# Patient Record
Sex: Male | Born: 1950 | Race: White | Hispanic: No | Marital: Married | Smoking: Former smoker
Health system: Southern US, Community
[De-identification: ages and names within clinical notes are randomized; demographics above are authoritative.]

## PROBLEM LIST (undated history)

## (undated) DIAGNOSIS — G43909 Migraine, unspecified, not intractable, without status migrainosus: Secondary | ICD-10-CM

## (undated) DIAGNOSIS — M549 Dorsalgia, unspecified: Secondary | ICD-10-CM

## (undated) DIAGNOSIS — E119 Type 2 diabetes mellitus without complications: Secondary | ICD-10-CM

## (undated) DIAGNOSIS — C801 Malignant (primary) neoplasm, unspecified: Secondary | ICD-10-CM

## (undated) DIAGNOSIS — E039 Hypothyroidism, unspecified: Secondary | ICD-10-CM

## (undated) DIAGNOSIS — I10 Essential (primary) hypertension: Secondary | ICD-10-CM

## (undated) DIAGNOSIS — M1990 Unspecified osteoarthritis, unspecified site: Secondary | ICD-10-CM

## (undated) HISTORY — PX: WRIST SURGERY: SHX841

## (undated) HISTORY — PX: SHOULDER SURGERY: SHX246

## (undated) HISTORY — PX: TESTICLE SURGERY: SHX794

## (undated) HISTORY — DX: Dorsalgia, unspecified: M54.9

## (undated) HISTORY — PX: ELBOW SURGERY: SHX618

## (undated) HISTORY — PX: BACK SURGERY: SHX140

## (undated) HISTORY — PX: ARTHROSCOPY WITH ANTERIOR CRUCIATE LIGAMENT (ACL) REPAIR WITH ANTERIOR TIBILIAS GRAFT: SHX6503

---

## 1997-05-16 ENCOUNTER — Ambulatory Visit (HOSPITAL_COMMUNITY): Admission: RE | Admit: 1997-05-16 | Discharge: 1997-05-16 | Payer: Self-pay | Admitting: Family Medicine

## 1999-01-27 ENCOUNTER — Encounter: Admission: RE | Admit: 1999-01-27 | Discharge: 1999-02-08 | Payer: Self-pay | Admitting: Family Medicine

## 1999-02-26 ENCOUNTER — Ambulatory Visit (HOSPITAL_BASED_OUTPATIENT_CLINIC_OR_DEPARTMENT_OTHER): Admission: RE | Admit: 1999-02-26 | Discharge: 1999-02-26 | Payer: Self-pay | Admitting: Orthopedic Surgery

## 1999-10-16 ENCOUNTER — Encounter: Payer: Self-pay | Admitting: Family Medicine

## 1999-10-16 ENCOUNTER — Ambulatory Visit (HOSPITAL_COMMUNITY): Admission: RE | Admit: 1999-10-16 | Discharge: 1999-10-16 | Payer: Self-pay | Admitting: Family Medicine

## 2000-03-20 ENCOUNTER — Ambulatory Visit (HOSPITAL_BASED_OUTPATIENT_CLINIC_OR_DEPARTMENT_OTHER): Admission: RE | Admit: 2000-03-20 | Discharge: 2000-03-20 | Payer: Self-pay | Admitting: Orthopedic Surgery

## 2001-07-27 ENCOUNTER — Encounter (INDEPENDENT_AMBULATORY_CARE_PROVIDER_SITE_OTHER): Payer: Self-pay | Admitting: Specialist

## 2001-07-27 ENCOUNTER — Ambulatory Visit (HOSPITAL_BASED_OUTPATIENT_CLINIC_OR_DEPARTMENT_OTHER): Admission: RE | Admit: 2001-07-27 | Discharge: 2001-07-27 | Payer: Self-pay | Admitting: Urology

## 2001-11-19 ENCOUNTER — Ambulatory Visit (HOSPITAL_COMMUNITY): Admission: RE | Admit: 2001-11-19 | Discharge: 2001-11-19 | Payer: Self-pay | Admitting: Gastroenterology

## 2001-11-19 ENCOUNTER — Encounter (INDEPENDENT_AMBULATORY_CARE_PROVIDER_SITE_OTHER): Payer: Self-pay | Admitting: *Deleted

## 2002-10-16 ENCOUNTER — Ambulatory Visit (HOSPITAL_BASED_OUTPATIENT_CLINIC_OR_DEPARTMENT_OTHER): Admission: RE | Admit: 2002-10-16 | Discharge: 2002-10-16 | Payer: Self-pay | Admitting: Orthopedic Surgery

## 2002-10-16 ENCOUNTER — Ambulatory Visit (HOSPITAL_COMMUNITY): Admission: RE | Admit: 2002-10-16 | Discharge: 2002-10-16 | Payer: Self-pay | Admitting: Orthopedic Surgery

## 2003-06-10 ENCOUNTER — Ambulatory Visit (HOSPITAL_COMMUNITY): Admission: RE | Admit: 2003-06-10 | Discharge: 2003-06-10 | Payer: Self-pay | Admitting: Family Medicine

## 2003-08-09 ENCOUNTER — Emergency Department (HOSPITAL_COMMUNITY): Admission: EM | Admit: 2003-08-09 | Discharge: 2003-08-09 | Payer: Self-pay | Admitting: Emergency Medicine

## 2004-05-21 ENCOUNTER — Ambulatory Visit (HOSPITAL_COMMUNITY): Admission: RE | Admit: 2004-05-21 | Discharge: 2004-05-21 | Payer: Self-pay | Admitting: Gastroenterology

## 2004-07-16 ENCOUNTER — Ambulatory Visit (HOSPITAL_COMMUNITY): Admission: RE | Admit: 2004-07-16 | Discharge: 2004-07-16 | Payer: Self-pay | Admitting: Family Medicine

## 2006-05-18 ENCOUNTER — Ambulatory Visit (HOSPITAL_COMMUNITY): Admission: RE | Admit: 2006-05-18 | Discharge: 2006-05-18 | Payer: Self-pay | Admitting: Family Medicine

## 2007-04-30 ENCOUNTER — Ambulatory Visit: Payer: Self-pay

## 2007-04-30 ENCOUNTER — Encounter: Payer: Self-pay | Admitting: Family Medicine

## 2007-08-07 ENCOUNTER — Inpatient Hospital Stay (HOSPITAL_COMMUNITY): Admission: RE | Admit: 2007-08-07 | Discharge: 2007-08-11 | Payer: Self-pay | Admitting: Neurosurgery

## 2008-01-07 ENCOUNTER — Encounter: Admission: RE | Admit: 2008-01-07 | Discharge: 2008-02-18 | Payer: Self-pay | Admitting: Neurosurgery

## 2008-02-29 ENCOUNTER — Encounter: Admission: RE | Admit: 2008-02-29 | Discharge: 2008-02-29 | Payer: Self-pay | Admitting: Neurosurgery

## 2010-04-13 ENCOUNTER — Ambulatory Visit: Payer: Self-pay | Admitting: Physical Medicine & Rehabilitation

## 2010-05-18 NOTE — Discharge Summary (Signed)
NAMEMARCIN, [name]:  [id number]   MEDICAL RECORD NO.:  [id number]          PATIENT TYPE:  INP   LOCATION:  3006                         FACILITY:  [facility]   PHYSICIAN:  Payton Doughty, M.D.      DATE OF BIRTH:  Dec 04, 1950   DATE OF ADMISSION:  08/07/2007  DATE OF DISCHARGE:  08/11/2007                               DISCHARGE SUMMARY   ADMITTING DIAGNOSIS:  Lumbar spondylosis.   DISCHARGE DIAGNOSIS:  Lumbar spondylosis.   PROCEDURE:  L3-L4 and L4-L5 fusion.   COMPLICATIONS:  None.   DISCHARGE STATUS:  Alive and well.   BODY OF THE TEXT:  This is a 60 year old gentleman, whose history and  physical is recounted in chart.  He had back pain for numerous years and  had a visit with Dr. Venetia Maxon and was recommended to have L3-L4 and L4-L5  decompression and fusion.  History and physical is recounted in the  chart.  He was admitted on August 07, 2007, and underwent an  uncomplicated fusion of L3-L4 and L4-L5.  Postoperatively, he has done  well.  Foley was removed the first day.  He was started on physical  therapy.  Fifth and sixth,  he mobilized in PT.  He is off his PCA, he  is using oral pain medication.  Incision is dry and well healing.  He is  __________ with his brace and his activities of daily living.  He is  being discharged to care of his family, given Percocet for pain, and his  followup will be in the [facility] offices on Monday, via phone call to  arrange the visit with Dr. Venetia Maxon.           ______________________________  Payton Doughty, M.D.     MWR/MEDQ  D:  08/11/2007  T:  08/11/2007  Job:  [id number]

## 2010-05-18 NOTE — Op Note (Signed)
Stephen Lawson, DIEMER NO.:  [id number]   MEDICAL RECORD NO.:  [id number]          PATIENT TYPE:  INP   LOCATION:  3006                         FACILITY:  [facility]   PHYSICIAN:  Danae Orleans. Venetia Maxon, M.D.  DATE OF BIRTH:  [birth year]   DATE OF PROCEDURE:  DATE OF DISCHARGE:                               OPERATIVE REPORT   PREOPERATIVE DIAGNOSES:  Herniated lumbar disk, spondylosis,  degenerative disk disease, and radiculopathy L3-L4 and L4-L5 levels.   POSTOPERATIVE DIAGNOSES:  Herniated lumbar disk, spondylosis,  degenerative disk disease, and radiculopathy L3-L4 and L4-L5 levels.   PROCEDURE:  1. L3 and L4 laminectomy with diskectomy, L3-L4 and L4-L5.  2. Transforaminal lumbar interbody fusion L3-L4 (8-mm by medium PEEK      cage with morselized bone autograft BMP and FortrOss and L4-L5 with      9-mm PEEK TLIF cage with similar bone graft).  3. Segmental posterior instrumentation at L3 through L5 levels.  4. Posterolateral arthrodesis with BMP, morselized bone autograft,      FortrOss, and Actifuse.   SURGEON:  Danae Orleans. Venetia Maxon, MD   ASSISTANTS:  Georgiann Cocker, RN, and Payton Doughty, MD   ANESTHESIA:  General endotracheal anesthesia.   ESTIMATED BLOOD LOSS:  500 mL with 250 mL of Cell Saver blood returned  to the patient.   COMPLICATIONS:  None.   DISPOSITION:  Recovery room.   INDICATIONS:  Stephen Lawson is a 60 year old man who has previously  undergone L5-S1 decompression and fusion in the remote past who has now  developed significant disk degeneration and lateral osteophytes and  herniated disk material with significant left leg pain.  He has  undergone extensive conservative management with injection therapy and  physical therapy, but has had persistent and progressive pain.  It was  elected to take him to surgery for decompression and fusion at these  affected levels.   PROCEDURE:  [name] was brought to the operating room.  Following a  satisfactory and  uncomplicated induction of general endotracheal  anesthesia and placement of intravenous lines and a Foley catheter, the  patient was placed in a prone position on the Reeves table.  Soft  tissue and bony prominences were padded appropriately.  His low back was  prepped and draped in the usual sterile fashion.  The area of planned  incision was infiltrated with local lidocaine.  An incision was made in  the midline from overlying his previous incision from what was felt to  be L3 through L5 levels and carried through the lumbodorsal fascia,  which was incised bilaterally using subperiosteal dissection.  The L3,  L4, and L5 transverse processes were identified.  Self-retaining  retractor was placed to facilitate exposure.  Intraoperative x-ray  confirmed markup probes at the L3, L4, and L5 transverse processes.  Subsequently, a hemilaminectomy of L4 and then subsequently of L3 were  performed on the left side of midline and the thecal sac and L4 and L5  and subsequently L3 nerve roots were decompressed widely.  The  degenerated disks were identified and D'Errico nerve root  retractor was  used to protect the nerve elements.  Using loupe magnification,  endplates were stripped to residual disk material and a thorough  diskectomy was performed.  After diskectomy, endplate preparation and  curettes were utilized and residual disk material and cartilaginous  material was removed.  A laminar spreader was placed in the right side  of midline to facilitate exposure of the interspace, and after a trial  sizing, a 9-mm banana-shaped PEEK medium interbody cage was selected,  packed with morselized bone autograft and FortrOss with a small BMP kit  within the implant.  Additional BMP and FortrOss and bone graft were  placed deep within the interspace and the implant was then placed.  An  additional bone autograft was placed overlying it and tamped into  position.  Similarly, at the L3-L4 level,  thorough diskectomy was  performed.  There was a significant amount of lateral osteophyte.  The  L3 and L4 nerve roots were decompressed as was the thecal sac, and after  trial sizing, an 8-mm PEEK banana-shaped cage was inserted into the  interspace, countersunk appropriately, and again BMP and FortrOss were  placed deep to the implant.  An additional bone autograft was placed  overlying the implant and tamped into position.  Pedicle screw fixation  was then utilized using 45- x 5.5-mm screws at L4-L5 and 5.5- x 50-mm  screws at L3.  All screws had excellent purchase and their positioning  was confirmed on AP and lateral fluoroscopy.  Prelordosed 80-mm rods  were placed, but prior to doing so, the posterolateral elements on the  facet joint complex was decorticated on the right side of midline and  additional bone autograft, FortrOss, BMP, and Actifuse were placed in  this region.  The rods were then locked down in situ.  The self-  retaining retractor was removed and the lumbodorsal fascia was closed  with 1 Vicryl suture, subcutaneous tissues were approximated with 2-0  Vicryl interrupted inverted sutures.  Skin edges were approximated with  interrupted 3-0 Vicryl subcutaneous stitch.  Wound was dressed with  Benzoin, Steri-Strips, Telfa gauze, and tape.  The patient was extubated  in the operating room and taken to recovery in stable satisfactory  condition.      Danae Orleans. Venetia Maxon, M.D.  Electronically Signed     JDS/MEDQ  D:  08/07/2007  T:  08/08/2007  Job:  [id number]

## 2010-05-21 NOTE — Op Note (Signed)
NAME:  Stephen Lawson, Stephen Lawson                            ACCOUNT NO.:  [id number]   MEDICAL RECORD NO.:  [id number]                   PATIENT TYPE:  AMB   LOCATION:  ENDO                                 FACILITY:  [facility]   PHYSICIAN:  Petra Kuba, M.D.                 DATE OF BIRTH:  1950/12/18   DATE OF PROCEDURE:  11/19/2001  DATE OF DISCHARGE:                                 OPERATIVE REPORT   PROCEDURE:  Colonoscopy with polypectomy.   INDICATIONS FOR PROCEDURE:  Family history of colon polyps.  Consent was  signed after risks, benefits, methods, and options were thoroughly discussed  in the office.   MEDICATIONS:  Demerol 70, Versed 7.   SURGEON:  Petra Kuba, M.D.   DESCRIPTION OF PROCEDURE:  Rectal inspection was pertinent for external  hemorrhoids.  Digital examination was negative. Video colonoscope was  inserted and easily advanced around the colon to the cecum. This did require  abdominal pressure, but no position changes. No obvious abnormalities were  seen on insertion. The cecum was identified by the appendiceal orifice and  the ileocecal valve. The scope was slowly withdrawn and the prep was  adequate. There was a fair amount of liquid stool that required washing and  suctioning for adequate visualization.  On slow withdrawal through the  colon, two small ascending polyps were seen.  One was snared, electrocautery  applied, and the polyp was suctioned through the scope and collected in the  trap. Possibly there was some residual adenomatous tissue which was hot  biopsied x1. Two other tiny to small ascending and hepatic flexure polyps  were seen and hot biopsied x1.  No other abnormalities were seen as we  slowly withdrew back to the rectum.  Once back in the rectum, the scope was  retroflexed, pertinent for some internal hemorrhoids. The scope was  straightened and readvanced a short ways up the left side of the colon. Air  was suctioned and the scope removed.  The  patient tolerated the procedure  well.  There was no obvious immediate complications.   ENDOSCOPIC ASSESSMENT:  1. Internal and external hemorrhoids.  2. Three tiny and small hepatic flexure and ascending polyps, two hot     biopsied, one hot snared and then hot biopsied.  3. Otherwise within normal limits to the cecum.    PLAN:  Await pathology to determine future colonic screening.  Happy to see  back p.r.n.  Otherwise return to the care of Dr. Reola Calkins for the customary  health care maintenance including yearly rectals and guaiacs.                                               Petra Kuba, M.D.    MEM/MEDQ  D:  11/19/2001  T:  11/19/2001  Job:  [id number]   cc:   Gaynelle Cage  [address]  [place]  [place] [address]  Fax: [phone]

## 2010-05-21 NOTE — Op Note (Signed)
NAME:  Stephen Lawson, Stephen Lawson                            ACCOUNT NO.:  [id number]   MEDICAL RECORD NO.:  [id number]                   PATIENT TYPE:  AMB   LOCATION:  DSC                                  FACILITY:  [facility]   PHYSICIAN:  Harvie Junior, M.D.                DATE OF BIRTH:  [birth year]   DATE OF PROCEDURE:  10/16/2002  DATE OF DISCHARGE:                                 OPERATIVE REPORT   PREOPERATIVE DIAGNOSIS:  Impingement, acromioclavicular joint arthritis,  right shoulder.   POSTOPERATIVE DIAGNOSIS:  1. Impingement, acromioclavicular joint arthritis, right shoulder.  2. Glenohumeral arthritic change with multiple loose bodies.   OPERATION PERFORMED:  1. Anterolateral acromioplasty from the lateral and posterior portal.  2. Distal clavicle resection through an anterior portal.  3. Debridement of glenohumeral joint with removal of multiple     osteocartilaginous loose bodies from within the glenohumeral joint.   SURGEON:  Harvie Junior, M.D.   ASSISTANT:  Marshia Ly, P.A.   ANESTHESIA:  General with intrascalene block.   INDICATIONS FOR PROCEDURE:  He is a 60 year old male with a history of  having right shoulder problems.  He had been treated with injection therapy,  anti-inflammatory medication, activity modification.  He failed all of this  and because of continued complaints of pain, he was brought to the operating  room for operative arthroscopy.   DESCRIPTION OF PROCEDURE:  The patient was brought to the operating room and  after adequate anesthesia was obtained with general anesthetic, the patient  was placed supine on the operating table.  He was then moved to the beach  chair position.  All bony prominences were well padded.  Attention then  turned to the right shoulder, where after routine prepping and draping and a  shoulder arthroscopy was performed, attention was turned initially into the  glenohumeral joint where there were noted to be multiple  osteocartilaginous  loose bodies.  These were removed with a suction shaver.  The donor site  appeared to be the anterior inferior labrum and glenoid where this was  debrided with suction shaver back to a smooth and stable rim.  Biceps tendon  looked normal.  Rotator cuff undersurface looked normal.  Attention was then  turned out of the glenohumeral joint and subacromial space where there was a  large anterolateral spur.  The anterolateral spur was removed with a  motorized bur.  The distal clavicle was resected over a span of 17 mm.  At  this point the rotator cuff was identified superior surface.  No evidence of  tearing.  There was a significant tenacious bursa on the rotator cuff and  this was debrided with suction shaver  significantly.  At this point the shoulder was copiously irrigated and  suctioned dry.  The arthroscopic portals were closed with a bandage, sterile  compressive dressing was applied.  The patient was taken  to the recovery  room where he was noted to be in satisfactory condition.  The estimated  blood loss for this procedure was none.                                                Harvie Junior, M.D.    Ranae Plumber  D:  10/16/2002  T:  10/16/2002  Job:  [id number]

## 2010-05-21 NOTE — Op Note (Signed)
. [facility]  Patient:    Stephen Lawson, Stephen Lawson                         MRN: [id number] Proc. Date: 03/20/00 Adm. Date:  [id number] Attending:  Milly Jakob                           Operative Report  PREOPERATIVE DIAGNOSIS:  Carpal tunnel syndrome, right.  POSTOPERATIVE DIAGNOSIS:  Carpal tunnel syndrome, right.  PROCEDURE:  Right carpal tunnel release.  SURGEON:  Harvie Junior, M.D.  ASSISTANT:  None.  ANESTHESIA:  Forearm-based IV regional.  BRIEF HISTORY:  A 60 year old male with a long history of having numbness and tingling in his right arm with clumsiness.  He had EMGs that show that he had carpal tunnel syndrome.  He failed at conservative care with night splinting and anti-inflammatory medication and injection.  After discussion of treatment options, he ultimately elected to undergo carpal tunnel release, and he is brought to the operating room for this procedure.  DESCRIPTION OF PROCEDURE:  The patient was taken to the operating room, where after anesthesia was obtained with a forearm-based IV regional, the patient placed supine upon the operating table.  The right arm was then prepped and draped in the usual sterile fashion.  Following this, an incision was made over just ulnar to the midline wrist crease, subcutaneous tissue taken down to the level of the volar ligament, which was identified and carefully divided. A Freer elevator was used to make sure the nerve was not adherent underneath, and the carpal ligament was then divided in both the proximal and distal direction.  Following this, the wound was copiously irrigated and suctioned dry.  A gloved finger could be placed in the wound both proximal and distal, and the nerve was identified and noted to have an hour glass-type constriction.  Following this, the wound was approximated with an interrupted suture and then a running suture.  A sterile and compressive dressing was applied  as well as a volar plaster, and the patient was then taken to the recovery room, where he was noted to be in satisfactory condition.  The estimated blood loss for for the procedure was none. DD:  03/20/00 TD:  03/21/00 Job: [id number]

## 2010-05-21 NOTE — Op Note (Signed)
[facility]  Patient:    Stephen Lawson, Stephen Lawson Visit Number: [id number] MRN: [id number]          Service Type: NES Location: NESC Attending Physician:  Tania Ade Dictated by:   Lucrezia Starch. Ovidio Hanger, M.D. Proc. Date: 07/27/01 Admit Date:  07/27/2001                             Operative Report  PREOPERATIVE DIAGNOSIS:  Right spermatocele.  POSTOPERATIVE DIAGNOSIS:  Right spermatocele.  OPERATION PERFORMED:  Right spermatocelectomy.  SURGEON:  Lucrezia Starch. Ovidio Hanger, M.D.  ANESTHESIA:  General laryngeal airways.  ESTIMATED BLOOD LOSS:  10 cc.  TUBES:  1/4 inch Penrose drain.  COMPLICATIONS:  None.  INDICATIONS FOR PROCEDURE:  [name] is a very nice 60 year old white male who presented for a growth in the right testicle that has been enlarging for some time and has become tender and uncomfortable. By ultrasound it revealed an obvious right spermatocele. He knows the chance of being malignant is extremely low but it is uncomfortable and he would like to have it repaired. After understanding the risks, benefits, and alternatives, he has elected to proceed with spermatocelectomy.  DESCRIPTION OF PROCEDURE:  The patient was placed in supine position, after proper general laryngeal airway anesthesia was prepped and draped with Betadine in a sterile fashion. A right transverse scrotal incision was made and dissection was carried down to dartos tunic. The tunica vaginalis was incised and the testicle and adnexa were delivered. The appendix testis was cauterized with Bovie coagulation cautery. A large spermatocele was noted to be present in the head in the epididymis. Dissection was carried around the spermatocele actually on top of the spermatocele. The epididymis was ligated in the mid body and dissection was carried to the rete testis area where this was clamped with a hemostat in the spermatocele and the head of the epididymis were removed on  block. Thorough irrigation was performed, good hemostasis noted to be present. The rete testis were ligated with 2-0 chromic catgut. Thorough irrigation was performed, good hemostasis was noted to be present and the testicle was noted to be well vascularized and was placed in the scrotal sac. A 1/4 inch Penrose drain was placed through a separate stab incision and sutured in place with 2-0 chromic catgut. The tunica vaginalis was closed with running 3-0 chromic catgut and dartos tunic was closed with a running 3-0 chromic catgut and the skin was closed with running horizontal mattress 3-0 chromic catgut. The wound was dressed in a sterile fashion. The patient was taken to the recovery room stable. Dictated by:   Lucrezia Starch. Ovidio Hanger, M.D. Attending Physician:  Tania Ade DD:  07/27/01 TD:  07/31/01 Job: [id number]

## 2010-05-21 NOTE — Op Note (Signed)
NAME:  RASEAN, JOOS NO.:  [id number]   MEDICAL RECORD NO.:  [id number]          PATIENT TYPE:  AMB   LOCATION:  ENDO                         FACILITY:  [facility]   PHYSICIAN:  Petra Kuba, M.D.    DATE OF BIRTH:  10-18-1950   DATE OF PROCEDURE:  05/21/2004  DATE OF DISCHARGE:                                 OPERATIVE REPORT   PROCEDURE:  Colonoscopy   INDICATIONS:  Family history of colon polyps, personal history of colon  polyps, due for colonic screening. Consent was signed after risks, benefits,  methods, and options thoroughly discussed multiple times in the past.   MEDICINES USED:  Demerol 60, Versed 6.   PROCEDURE:  Rectal inspection was pertinent for external hemorrhoids.  Digital exam was negative. The video colonoscope was inserted, easily  advanced around the colon to the cecum. This did require abdominal pressure  but no position changes. No abnormality was seen on insertion. The cecum was  identified by the appendiceal orifice and ileocecal valve. The scope was  then slowly withdrawn. The prep was adequate. There was some liquid stool  that required washing and suctioning. On slow withdrawal through the colon,  other than some rare early left-sided diverticula, no other abnormalities  were seen. Specifically no polyps, tumors or masses. Once back in the  rectum, anorectal pull-through and retroflexion confirmed some small  hemorrhoids. Scope was straightened and readvanced short ways up the left  side of the colon. Air was suctioned, scope removed. The patient tolerated  the procedure well. There was no obvious immediate complication.   ENDOSCOPIC DIAGNOSIS:  1.  Internal/external small hemorrhoids.  2.  Rare early left-sided diverticula.  3.  Otherwise within normal limits to the cecum.   PLAN:  Recheck colon screening in 5 years. Happy to see back p.r.n.  Otherwise return care to Dr. Christell Constant for the customary health care maintenance  to include  yearly rectals and guaiacs.      MEM/MEDQ  D:  05/21/2004  T:  05/21/2004  Job:  [id number]   cc:   Ernestina Penna, M.D.  [address] [place]  [place] [address]  Fax: [phone]

## 2010-05-21 NOTE — Op Note (Signed)
[facility]  Patient:    Stephen, Lawson                         MRN: [id number] Proc. Date: 02/26/99 Adm. Date:  [id number] Attending:  Milly Jakob CC:         Harvie Junior, M.D.                           Operative Report  PREOPERATIVE DIAGNOSIS:  Lateral epicondylitis.  POSTOPERATIVE DIAGNOSES: 1. Lateral epicondylitis. 2. 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  SURGEON:  Harvie Junior, M.D.  Threasa HeadsWilla Rough.  ANESTHESIA:  General.  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  He also underwent and failed conservative care nd underwent surgical intervention.  He did wonderfully.  He developed the same process in his right arm.  He again, went through injection therapy, physical therapy and because of failure of that conservative care was ultimately brought to the operating room for surgical fixation.  PROCEDURE:  Patient brought to the operating room and after adequate anesthesia was obtained with general anesthetic, the patient placed supine on the operating table. The right arm was then prepped and draped in usual sterile fashion.  Following this, the right upper extremity was exsanguinated.  Blood pressure tourniquet was inflated to 250 mmHg.  A linear incision was made about 6 cm long over the lateral epicondyle and going distally over the radial head.  Following this, the lateral epicondyle was identified and an incision was made over the fascia overlying lateral epicondyle.  Significant bogginess was encountered at this point and there was a clear and significant effusion in the elbow joint.  At this point, the lateral epicondylar area was exposed through subperiosteal dissection. Extensor carpi radialis brevis origin was identified.  It was noted to be thickened markedly with degenerative fibers, as well as, significant  fluid within the joint.  At this point, the fibers of the extensor brevis were excised and the incision was extended a little bit distally to allow access into the radial head and capitellar joint. The capitellum looked without evidence of abnormality.  The radial head had some irregularity and arthritic change.  There was a fair amount of synovitis in the  elbow joint and a synovectomy was performed with the rongeur.  The elbow was then copiously irrigated and suctioned dry.  The lateral epicondyle was then exposed and the rongeur was used to remove the lateral epicondyle after an osteotome.  This  area was smoothed down and the extensor fascia was then repaired over the denuded bone and lateral epicondyle.  The joint was irrigated one final time and suctioned dry.  The fascia over the lateral epicondyle was then closed with 0 Vicryl interrupted suture.  A sterile compressive dressing was then applied.  A posterior splint was then applied and the patient was put into a sling.  He was then taken to the recovery room where he was noted to be satisfactory condition.  Estimated blood loss for the procedure was none. DD:  02/26/99 TD:  02/26/99 Job: [id number]

## 2010-06-08 ENCOUNTER — Emergency Department (HOSPITAL_COMMUNITY): Payer: 59

## 2010-06-08 ENCOUNTER — Emergency Department (HOSPITAL_COMMUNITY)
Admission: EM | Admit: 2010-06-08 | Discharge: 2010-06-09 | Disposition: A | Discharge status: Home / Self Care | Payer: 59 | Attending: Emergency Medicine | Admitting: Emergency Medicine

## 2010-06-08 ENCOUNTER — Encounter (HOSPITAL_COMMUNITY): Payer: Self-pay | Admitting: Radiology

## 2010-06-08 DIAGNOSIS — R1031 Right lower quadrant pain: Secondary | ICD-10-CM | POA: Insufficient documentation

## 2010-06-08 DIAGNOSIS — I10 Essential (primary) hypertension: Secondary | ICD-10-CM | POA: Insufficient documentation

## 2010-06-08 DIAGNOSIS — Z79899 Other long term (current) drug therapy: Secondary | ICD-10-CM | POA: Insufficient documentation

## 2010-06-08 DIAGNOSIS — R112 Nausea with vomiting, unspecified: Secondary | ICD-10-CM | POA: Insufficient documentation

## 2010-06-08 HISTORY — DX: Essential (primary) hypertension: I10

## 2010-06-08 LAB — DIFFERENTIAL
Basophils Absolute: 0 10*3/uL (ref 0.0–0.1)
Lymphocytes Relative: 18 % (ref 12–46)
Monocytes Absolute: 0.8 10*3/uL (ref 0.1–1.0)
Monocytes Relative: 6 % (ref 3–12)
Neutro Abs: 10.2 10*3/uL — ABNORMAL HIGH (ref 1.7–7.7)

## 2010-06-08 LAB — HEPATIC FUNCTION PANEL
ALT: 22 U/L (ref 0–53)
AST: 19 U/L (ref 0–37)
Albumin: 4 g/dL (ref 3.5–5.2)
Alkaline Phosphatase: 85 U/L (ref 39–117)
Total Bilirubin: 0.3 mg/dL (ref 0.3–1.2)
Total Protein: 7.6 g/dL (ref 6.0–8.3)

## 2010-06-08 LAB — URINALYSIS, ROUTINE W REFLEX MICROSCOPIC
Bilirubin Urine: NEGATIVE
Glucose, UA: NEGATIVE mg/dL
Hgb urine dipstick: NEGATIVE
Specific Gravity, Urine: 1.025 (ref 1.005–1.030)
Urobilinogen, UA: 0.2 mg/dL (ref 0.0–1.0)

## 2010-06-08 LAB — CBC
HCT: 45.1 % (ref 39.0–52.0)
Hemoglobin: 15.1 g/dL (ref 13.0–17.0)
MCH: 27.2 pg (ref 26.0–34.0)
MCHC: 33.5 g/dL (ref 30.0–36.0)
RBC: 5.55 MIL/uL (ref 4.22–5.81)

## 2010-06-08 LAB — LIPASE, BLOOD: Lipase: 16 U/L (ref 11–59)

## 2010-06-08 LAB — BASIC METABOLIC PANEL
CO2: 33 mEq/L — ABNORMAL HIGH (ref 19–32)
Calcium: 9.2 mg/dL (ref 8.4–10.5)
Chloride: 102 mEq/L (ref 96–112)
Glucose, Bld: 119 mg/dL — ABNORMAL HIGH (ref 70–99)
Sodium: 142 mEq/L (ref 135–145)

## 2010-06-08 MED ORDER — IOHEXOL 300 MG/ML  SOLN
110.0000 mL | Freq: Once | INTRAMUSCULAR | Status: AC | PRN
  Administered 2010-06-08: 110 mL via INTRAVENOUS

## 2010-06-17 ENCOUNTER — Other Ambulatory Visit: Payer: Self-pay | Admitting: Gastroenterology

## 2010-10-01 LAB — COMPREHENSIVE METABOLIC PANEL
Albumin: 4.3
Alkaline Phosphatase: 75
BUN: 13
Calcium: 9.4
Creatinine, Ser: 1.01
Glucose, Bld: 98
Total Protein: 7.1

## 2010-10-01 LAB — BASIC METABOLIC PANEL
BUN: 10
BUN: 12
Calcium: 8.4
Chloride: 100
Creatinine, Ser: 0.91
GFR calc Af Amer: >60
GFR calc non Af Amer: >60
GFR calc non Af Amer: >60
Glucose, Bld: 158 — ABNORMAL HIGH
Potassium: 3.7
Sodium: 136

## 2010-10-01 LAB — CBC
HCT: 45.8
Hemoglobin: 15.6
MCHC: 33.9
MCV: 84.4
Platelets: 322
Platelets: 288
RDW: 13.6
RDW: 13.3
WBC: 14.8 — ABNORMAL HIGH

## 2010-10-01 LAB — POCT I-STAT 4, (NA,K, GLUC, HGB,HCT)
Glucose, Bld: 107 — ABNORMAL HIGH
HCT: 37 — ABNORMAL LOW
Hemoglobin: 12.6 — ABNORMAL LOW
Hemoglobin: 14.3
Potassium: 2.9 — ABNORMAL LOW
Sodium: 137
Sodium: 142

## 2010-10-01 LAB — TYPE AND SCREEN
ABO/RH(D): O POS
Antibody Screen: NEGATIVE

## 2011-03-15 ENCOUNTER — Other Ambulatory Visit: Payer: Self-pay | Admitting: Dermatology

## 2012-06-21 ENCOUNTER — Other Ambulatory Visit: Payer: Self-pay

## 2012-06-22 ENCOUNTER — Other Ambulatory Visit: Payer: Self-pay

## 2012-06-22 MED ORDER — HYDROCHLOROTHIAZIDE 25 MG PO TABS: 25.0000 mg | ORAL_TABLET | Freq: Every day | ORAL | Status: DC

## 2012-06-22 MED ORDER — ATENOLOL 100 MG PO TABS: 100.0000 mg | ORAL_TABLET | Freq: Every day | ORAL | Status: DC

## 2012-09-12 ENCOUNTER — Other Ambulatory Visit: Payer: Self-pay | Admitting: Family Medicine

## 2012-10-25 ENCOUNTER — Ambulatory Visit (INDEPENDENT_AMBULATORY_CARE_PROVIDER_SITE_OTHER): Payer: 59 | Admitting: Family Medicine

## 2012-10-25 ENCOUNTER — Encounter (INDEPENDENT_AMBULATORY_CARE_PROVIDER_SITE_OTHER): Payer: Self-pay

## 2012-10-25 ENCOUNTER — Encounter: Payer: Self-pay | Admitting: Family Medicine

## 2012-10-25 VITALS — BP 109/59 | HR 60 | Temp 97.9°F | Ht 71.0 in | Wt 243.0 lb

## 2012-10-25 DIAGNOSIS — I10 Essential (primary) hypertension: Secondary | ICD-10-CM

## 2012-10-25 DIAGNOSIS — Z0000 Encounter for general adult medical examination without abnormal findings: Secondary | ICD-10-CM

## 2012-10-25 DIAGNOSIS — G43909 Migraine, unspecified, not intractable, without status migrainosus: Secondary | ICD-10-CM

## 2012-10-25 LAB — POCT CBC
Granulocyte percent: 73.7 %G (ref 37–80)
HCT, POC: 47.2 % (ref 43.5–53.7)
Hemoglobin: 15.2 g/dL (ref 14.1–18.1)
Lymph, poc: 2 (ref 0.6–3.4)
MCH, POC: 26.8 pg — AB (ref 27–31.2)
MCHC: 32.3 g/dL (ref 31.8–35.4)
MCV: 83 fL (ref 80–97)
MPV: 7.6 fL (ref 0–99.8)
POC Granulocyte: 6 (ref 2–6.9)
POC LYMPH PERCENT: 24.1 %L (ref 10–50)
Platelet Count, POC: 264 10*3/uL (ref 142–424)
RBC: 5.7 M/uL (ref 4.69–6.13)
RDW, POC: 14.3 %
WBC: 8.2 10*3/uL (ref 4.6–10.2)

## 2012-10-25 MED ORDER — SUMATRIPTAN SUCCINATE 100 MG PO TABS: 100.0000 mg | ORAL_TABLET | ORAL | Status: DC | PRN

## 2012-10-25 MED ORDER — ATENOLOL 100 MG PO TABS: 100.0000 mg | ORAL_TABLET | Freq: Every day | ORAL | Status: DC

## 2012-10-25 MED ORDER — HYDROCHLOROTHIAZIDE 25 MG PO TABS: 25.0000 mg | ORAL_TABLET | Freq: Every day | ORAL | Status: DC

## 2012-10-25 NOTE — Progress Notes (Signed)
  Subjective:    Patient ID: Stephen Lawson, male    DOB: May 16, 1950, 62 y.o.   MRN: [id number]  HPI This 62 y.o. male presents for evaluation of CPE.  He needs refills on medicine. He has hx of hypertension and migraine headaches.  He has no acute complaints.   Review of Systems No chest pain, SOB, HA, dizziness, vision change, N/V, diarrhea, constipation, dysuria, urinary urgency or frequency, myalgias, arthralgias or rash.     Objective:   Physical Exam Vital signs noted  Well developed well nourished male.  HEENT - Head atraumatic Normocephalic                Eyes - PERRLA, Conjuctiva - clear Sclera- Clear EOMI                Ears - EAC's Wnl TM's Wnl Gross Hearing WNL                Nose - Nares patent                 Throat - oropharanx wnl Respiratory - Lungs CTA bilateral Cardiac - RRR S1 and S2 without murmur GI - Abdomen soft Nontender and bowel sounds active x 4 Rectal - normal tone and prostate enlarged without masses. Extremities - No edema. Neuro - Grossly intact.       Assessment & Plan:  Essential hypertension, benign - Plan: atenolol (TENORMIN) 100 MG tablet, hydrochlorothiazide (HYDRODIURIL) 25 MG tablet  Migraine, unspecified, without mention of intractable migraine without mention of status migrainosus - Plan: SUMAtriptan (IMITREX) 100 MG tablet  Routine general medical examination at a health care facility - Plan: POCT CBC, CMP14+EGFR, Lipid panel, PSA, total and free, Thyroid Panel With TSH  Deatra Canter FNP

## 2012-10-25 NOTE — Patient Instructions (Signed)

## 2012-10-26 LAB — THYROID PANEL WITH TSH
Free Thyroxine Index: 2.2 (ref 1.2–4.9)
T3 Uptake Ratio: 28 % (ref 24–39)
T4, Total: 8 ug/dL (ref 4.5–12.0)
TSH: 3.3 u[IU]/mL (ref 0.450–4.500)

## 2012-10-26 LAB — CMP14+EGFR
ALT: 23 IU/L (ref 0–44)
AST: 21 IU/L (ref 0–40)
Albumin/Globulin Ratio: 1.7 (ref 1.1–2.5)
Albumin: 4.5 g/dL (ref 3.6–4.8)
Alkaline Phosphatase: 78 IU/L (ref 39–117)
BUN/Creatinine Ratio: 13 (ref 10–22)
BUN: 14 mg/dL (ref 8–27)
CO2: 28 mmol/L (ref 18–29)
Calcium: 9.6 mg/dL (ref 8.6–10.2)
Chloride: 96 mmol/L — ABNORMAL LOW (ref 97–108)
Creatinine, Ser: 1.04 mg/dL (ref 0.76–1.27)
GFR calc Af Amer: 89 mL/min/{1.73_m2} (ref >59)
GFR calc non Af Amer: 77 mL/min/{1.73_m2} (ref >59)
Globulin, Total: 2.6 g/dL (ref 1.5–4.5)
Glucose: 104 mg/dL — ABNORMAL HIGH (ref 65–99)
Potassium: 4.4 mmol/L (ref 3.5–5.2)
Sodium: 142 mmol/L (ref 134–144)
Total Bilirubin: 0.4 mg/dL (ref 0.0–1.2)
Total Protein: 7.1 g/dL (ref 6.0–8.5)

## 2012-10-26 LAB — LIPID PANEL
Chol/HDL Ratio: 3.6 ratio units (ref 0.0–5.0)
Cholesterol, Total: 135 mg/dL (ref 100–199)
HDL: 38 mg/dL — ABNORMAL LOW (ref >39)
LDL Calculated: 81 mg/dL (ref 0–99)
Triglycerides: 79 mg/dL (ref 0–149)
VLDL Cholesterol Cal: 16 mg/dL (ref 5–40)

## 2012-10-26 LAB — PSA, TOTAL AND FREE
PSA, Free Pct: 56.7 %
PSA, Free: 0.17 ng/mL
PSA: 0.3 ng/mL (ref 0.0–4.0)

## 2012-11-08 ENCOUNTER — Other Ambulatory Visit: Payer: Self-pay

## 2012-11-19 ENCOUNTER — Ambulatory Visit (INDEPENDENT_AMBULATORY_CARE_PROVIDER_SITE_OTHER): Payer: 59 | Admitting: Family Medicine

## 2012-11-19 ENCOUNTER — Ambulatory Visit (INDEPENDENT_AMBULATORY_CARE_PROVIDER_SITE_OTHER): Payer: 59

## 2012-11-19 VITALS — BP 124/68 | HR 60 | Temp 98.3°F | Ht 71.0 in | Wt 247.8 lb

## 2012-11-19 DIAGNOSIS — J069 Acute upper respiratory infection, unspecified: Secondary | ICD-10-CM

## 2012-11-19 DIAGNOSIS — R609 Edema, unspecified: Secondary | ICD-10-CM

## 2012-11-19 DIAGNOSIS — R0602 Shortness of breath: Secondary | ICD-10-CM

## 2012-11-19 DIAGNOSIS — J209 Acute bronchitis, unspecified: Secondary | ICD-10-CM

## 2012-11-19 LAB — POCT INFLUENZA A/B
Influenza A, POC: NEGATIVE
Influenza B, POC: NEGATIVE

## 2012-11-19 MED ORDER — AZITHROMYCIN 250 MG PO TABS: ORAL_TABLET | ORAL | Status: DC

## 2012-11-19 MED ORDER — BENZONATATE 100 MG PO CAPS: 100.0000 mg | ORAL_CAPSULE | Freq: Two times a day (BID) | ORAL | Status: DC | PRN

## 2012-11-19 MED ORDER — FUROSEMIDE 20 MG PO TABS: ORAL_TABLET | ORAL | Status: DC

## 2012-11-19 NOTE — Progress Notes (Signed)
  Subjective:    Patient ID: Stephen Lawson, male    DOB: 08/21/1950, 62 y.o.   MRN: [id number]  HPI This 62 y.o. male presents for evaluation of cough, congestion, and flu like symptoms. He has some shortness of breath.  He c/o having SOB.  He c/o of DOE and he has difficulty Lying flat in bed because of his back and his breathing.  He has hx of asbestosis. He feels like he has had the flu.  He has been coughing, sneezing, and no energy. He c/o having a lot of  In Lower extremities and abdomen.  His cough is not productive.   Review of Systems C/o fatigue, swelling, arthralgias, myalgias, cough and SOB   No chest pain, HA, dizziness, vision change, N/V, diarrhea, constipation, dysuria, urinary urgency or frequency, or rash.  Objective:   Physical Exam Vital signs noted  Well developed well nourished male.  HEENT - Head atraumatic Normocephalic                Eyes - PERRLA, Conjuctiva - clear Sclera- Clear EOMI                Ears - EAC's Wnl TM's Wnl Gross Hearing WNL                Nose - Nares patent                 Throat - oropharanx wnl Respiratory - Lungs CTA bilateral Cardiac - RRR S1 and S2 without murmur GI - Abdomen soft Nontender and bowel sounds active x 4 Extremities - Bilateral LE edema one plus Neuro - Grossly intact.   CXR - No acute infiltrates      Assessment & Plan:  SOB (shortness of breath) - Plan: EKG 12-Lead, POCT Influenza A/B, DG Chest 2 View, azithromycin (ZITHROMAX) 250 MG tablet, CANCELED: EKG 12-Lead  URI (upper respiratory infection) - Plan: POCT Influenza A/B, DG Chest 2 View, azithromycin (ZITHROMAX) 250 MG tablet, CANCELED: POCT CBC.  CXR is negative and Flu is negative.  Acute bronchitis - Plan: azithromycin (ZITHROMAX) 250 MG tablet  Edema - Plan: furosemide (LASIX) 20 MG tablet one po qd x 3 days prn swelling #30  Deatra Canter FNP

## 2012-11-19 NOTE — Patient Instructions (Signed)

## 2012-11-22 ENCOUNTER — Encounter: Payer: Self-pay | Admitting: Family Medicine

## 2012-11-22 ENCOUNTER — Ambulatory Visit (INDEPENDENT_AMBULATORY_CARE_PROVIDER_SITE_OTHER): Payer: 59 | Admitting: Family Medicine

## 2012-11-22 VITALS — BP 117/60 | HR 55 | Temp 98.9°F | Ht 71.0 in | Wt 248.0 lb

## 2012-11-22 DIAGNOSIS — R5381 Other malaise: Secondary | ICD-10-CM

## 2012-11-22 DIAGNOSIS — J209 Acute bronchitis, unspecified: Secondary | ICD-10-CM

## 2012-11-22 DIAGNOSIS — J61 Pneumoconiosis due to asbestos and other mineral fibers: Secondary | ICD-10-CM

## 2012-11-22 DIAGNOSIS — R609 Edema, unspecified: Secondary | ICD-10-CM

## 2012-11-22 DIAGNOSIS — R0602 Shortness of breath: Secondary | ICD-10-CM

## 2012-11-22 DIAGNOSIS — I4949 Other premature depolarization: Secondary | ICD-10-CM

## 2012-11-22 DIAGNOSIS — I493 Ventricular premature depolarization: Secondary | ICD-10-CM

## 2012-11-22 LAB — POCT CBC
Granulocyte percent: 58.6 %G (ref 37–80)
HCT, POC: 45.1 % (ref 43.5–53.7)
Hemoglobin: 14.7 g/dL (ref 14.1–18.1)
Lymph, poc: 2.9 (ref 0.6–3.4)
MCH, POC: 26.7 pg — AB (ref 27–31.2)
MCHC: 32.6 g/dL (ref 31.8–35.4)
MCV: 81.9 fL (ref 80–97)
MPV: 6.9 fL (ref 0–99.8)
POC Granulocyte: 4.9 (ref 2–6.9)
POC LYMPH PERCENT: 35.5 %L (ref 10–50)
Platelet Count, POC: 286 10*3/uL (ref 142–424)
RBC: 5.5 M/uL (ref 4.69–6.13)
RDW, POC: 13.8 %
WBC: 8.3 10*3/uL (ref 4.6–10.2)

## 2012-11-22 MED ORDER — PREDNISONE 10 MG PO TABS: ORAL_TABLET | ORAL | Status: DC

## 2012-11-22 MED ORDER — AMOXICILLIN 875 MG PO TABS: 875.0000 mg | ORAL_TABLET | Freq: Two times a day (BID) | ORAL | Status: DC

## 2012-11-22 MED ORDER — FUROSEMIDE 40 MG PO TABS: ORAL_TABLET | ORAL | Status: DC

## 2012-11-22 MED ORDER — ALBUTEROL SULFATE HFA 108 (90 BASE) MCG/ACT IN AERS: 2.0000 | INHALATION_SPRAY | Freq: Four times a day (QID) | RESPIRATORY_TRACT | Status: DC | PRN

## 2012-11-22 MED ORDER — LEVALBUTEROL HCL 1.25 MG/0.5ML IN NEBU: 1.2500 mg | INHALATION_SOLUTION | Freq: Once | RESPIRATORY_TRACT | Status: DC

## 2012-11-22 NOTE — Patient Instructions (Signed)

## 2012-11-22 NOTE — Progress Notes (Signed)
Subjective:    Patient ID: LUKEN SHADOWENS, male    DOB: 1950/01/08, 62 y.o.   MRN: [id number]  HPI This 62 y.o. male presents for evaluation of shortness of breath, edema, DOE, Fatigue, and c/o shortness of breath.  He states he has congestion and is coughing And has URI sx's.  He was seen a few days ago and tx'd with some lasix and Zpak and is Not any better.  He has been not working this week because he feels weak and doesn't Feel up to it.  He states he has never had this before and has never had swelling in his Lower extremities before.  He has hx of asbestosis and sees a pulmonary in [place] for Asbestosis and states he has been told his lung function is okay.  He states he has been Staying at home with his legs elevated and the swelling is not improving despite taking the Lasix.  He states he talked to his daughter who is a Engineer, civil (consulting) and she told him needs to get back In to see the doctor.  He states he was seen by his pulmonary 3 weeks ago.   Review of Systems C/o swelling and SOB.     No chest pain, HA, dizziness, vision change, N/V, diarrhea, constipation, dysuria, urinary urgency or frequency, myalgias, arthralgias or rash.  Objective:   Physical Exam  Vital signs noted  Well developed well nourished male.  HEENT - Head atraumatic Normocephalic                Eyes - PERRLA, Conjuctiva - clear Sclera- Clear EOMI                Ears - EAC's Wnl TM's Wnl Gross Hearing WNL                Nose - Nares patent                 Throat - oropharanx wnl Respiratory - Lungs diminished bilateral and after neb tx is given he Has more air exchange and lung sounds are better and clear to auscultation. Cardiac - RRR S1 and S2 without murmur GI - Abdomen soft Nontender and bowel sounds active x 4 Extremities - Generalized edema right lower extremity worse than left Neuro - Grossly intact.    Assessment & Plan:  Edema - Plan: Ambulatory referral to Cardiology, Brain natriuretic peptide,  furosemide (LASIX) 40 MG tablet  Refer to Cardiology to r/o Cardiac etiology.    Shortness of breath - Plan: Ambulatory referral to Cardiology, EKG 12-Lead, levalbuterol (XOPENEX) nebulizer solution 1.25 mg, predniSONE (DELTASONE) 10 MG tablet, amoxicillin (AMOXIL) 875 MG tablet, albuterol (PROVENTIL HFA;VENTOLIN HFA) 108 (90 BASE) MCG/ACT inhaler, CANCELED: PR BREATHING CAPACITY TEST. Discussed with patient he needs to use his MDI prn SOB, take prednisone taper, and abx's. Discussed with patient that he is probably having COPD exacerbation or that his asbestosis is Acting up and flaring.  Explained that this will cause swelling.  Explained he needs to follow up with pulmonary.  His oxygen saturations after walking drop to 90% on room air.  Asbestosis - Plan: levalbuterol (XOPENEX) nebulizer solution 1.25 mg, predniSONE (DELTASONE) 10 MG tablet, amoxicillin (AMOXIL) 875 MG tablet, albuterol (PROVENTIL HFA;VENTOLIN HFA) 108 (90 BASE) MCG/ACT inhaler, CANCELED: PR BREATHING CAPACITY TEST  Other malaise and fatigue - Plan: POCT CBC, BMP8+EGFR.  Probably due to exacerbation of COPD.  PVC's (premature ventricular contractions) - Plan: CANCELED: Holter monitor - 24 hour Follow up  with Cardiology.  Acute bronchitis - Plan: predniSONE (DELTASONE) 10 MG tablet, amoxicillin (AMOXIL) 875 MG tablet, albuterol (PROVENTIL HFA;VENTOLIN HFA) 108 (90 BASE) MCG/ACT inhaler.  Deatra Canter FNP

## 2012-11-23 LAB — BMP8+EGFR
BUN/Creatinine Ratio: 12 (ref 10–22)
BUN: 13 mg/dL (ref 8–27)
CO2: 32 mmol/L — ABNORMAL HIGH (ref 18–29)
Calcium: 9.9 mg/dL (ref 8.6–10.2)
Chloride: 97 mmol/L (ref 97–108)
Creatinine, Ser: 1.06 mg/dL (ref 0.76–1.27)
GFR calc Af Amer: 87 mL/min/{1.73_m2} (ref >59)
GFR calc non Af Amer: 75 mL/min/{1.73_m2} (ref >59)
Glucose: 128 mg/dL — ABNORMAL HIGH (ref 65–99)
Potassium: 4.1 mmol/L (ref 3.5–5.2)
Sodium: 142 mmol/L (ref 134–144)

## 2012-11-23 LAB — BRAIN NATRIURETIC PEPTIDE: BNP: 32.7 pg/mL (ref 0.0–100.0)

## 2012-12-03 ENCOUNTER — Telehealth: Payer: Self-pay | Admitting: Family Medicine

## 2012-12-03 ENCOUNTER — Telehealth: Payer: Self-pay | Admitting: Cardiology

## 2012-12-03 NOTE — Telephone Encounter (Signed)
New message    Dr Ander Slade is referring pt to Dr Antoine Poche for SOB. However, his next available appt here is [place] and next available appt in [place] is [place].  They want him seen sooner, Can we work in this pt? They want [place] only because he will eventually be seen in [place].

## 2012-12-03 NOTE — Telephone Encounter (Signed)
Pt can be seen at the [facility] office by Dr Antoine Poche 12/14/2012 if they can come to [facility]

## 2012-12-04 NOTE — Telephone Encounter (Signed)
Pt aware waiting on Cardiology to call us back. Trying to work him in.

## 2012-12-05 ENCOUNTER — Encounter: Payer: Self-pay | Admitting: Interventional Cardiology

## 2012-12-05 ENCOUNTER — Encounter: Payer: Self-pay | Admitting: *Deleted

## 2012-12-05 NOTE — Telephone Encounter (Signed)
Message copied by Baltazar Apo on Wed Dec 05, 2012  9:25 AM ------      Message from: Deatra Canter      Created: Tue Nov 27, 2012  8:40 AM       Labs look good ------

## 2012-12-05 NOTE — Telephone Encounter (Signed)
Pt.notified

## 2012-12-06 ENCOUNTER — Encounter: Payer: Self-pay | Admitting: Cardiology

## 2012-12-06 ENCOUNTER — Encounter: Payer: Self-pay | Admitting: Interventional Cardiology

## 2012-12-06 ENCOUNTER — Ambulatory Visit (INDEPENDENT_AMBULATORY_CARE_PROVIDER_SITE_OTHER): Payer: 59 | Admitting: Interventional Cardiology

## 2012-12-06 VITALS — BP 100/60 | HR 56 | Ht 71.0 in | Wt 247.0 lb

## 2012-12-06 DIAGNOSIS — Z79899 Other long term (current) drug therapy: Secondary | ICD-10-CM

## 2012-12-06 DIAGNOSIS — K7689 Other specified diseases of liver: Secondary | ICD-10-CM

## 2012-12-06 DIAGNOSIS — K760 Fatty (change of) liver, not elsewhere classified: Secondary | ICD-10-CM | POA: Insufficient documentation

## 2012-12-06 DIAGNOSIS — Z77090 Contact with and (suspected) exposure to asbestos: Secondary | ICD-10-CM | POA: Insufficient documentation

## 2012-12-06 DIAGNOSIS — R079 Chest pain, unspecified: Secondary | ICD-10-CM

## 2012-12-06 DIAGNOSIS — R609 Edema, unspecified: Secondary | ICD-10-CM | POA: Insufficient documentation

## 2012-12-06 DIAGNOSIS — R0602 Shortness of breath: Secondary | ICD-10-CM

## 2012-12-06 LAB — BASIC METABOLIC PANEL
GFR: 72.23 mL/min (ref >60.00)
Glucose, Bld: 155 mg/dL — ABNORMAL HIGH (ref 70–99)
Potassium: 3.3 mEq/L — ABNORMAL LOW (ref 3.5–5.1)
Sodium: 140 mEq/L (ref 135–145)

## 2012-12-06 MED ORDER — FUROSEMIDE 40 MG PO TABS: ORAL_TABLET | ORAL | Status: DC

## 2012-12-06 NOTE — Patient Instructions (Signed)
Your physician has requested that you have en exercise stress myoview. For further information please visit [email or web address] Please follow instruction sheet, as given.  Your physician has requested that you have an echocardiogram. Echocardiography is a painless test that uses sound waves to create images of your heart. It provides your doctor with information about the size and shape of your heart and how well your heart's chambers and valves are working. This procedure takes approximately one hour. There are no restrictions for this procedure.  Your physician recommends that you return for lab work today for Bmet.  Your physician has recommended you make the following change in your medication:   1. Stop HCTZ.  2. Continue lasix as you are taking it along with all other medications.

## 2012-12-06 NOTE — Progress Notes (Signed)
Patient ID: Stephen Lawson, male   DOB: [birth year], 62 y.o.   MRN: [id number]     Patient ID: Stephen Lawson MRN: [id number] DOB/AGE: 1950-02-17 62 y.o.   Referring Physician  Dr. Purcell Nails   Reason for Consultation shortness of breath  HPI:  62 y/o with a h/o asbestosis.  He has had progressive shortness of breath over the past year.  He has [facility] with walking in the grocery store or department store.  He has SHOB with bending down.  He has had leg swelling for the past few months.  It is getting worse.  It seemed that the left is worse than the left.  He has had some right hand swelling.    He has had occasional chest pain at times as well. It typically starts in his back and radiates forward. He has nearly come to the emergency room but the pain has gone away. He had a stress test many years ago. At this point, he does not think he can walk very long on the treadmill but he is willing to try.  He does feel a significant diuretic effect from the Lasix. He works better than the HCTZ has worked in the past. He has been on both diuretics for a couple of weeks now   Current Outpatient Prescriptions  Medication Sig Dispense Refill  . albuterol (PROVENTIL HFA;VENTOLIN HFA) 108 (90 BASE) MCG/ACT inhaler Inhale 2 puffs into the lungs every 6 (six) hours as needed for wheezing or shortness of breath.  1 Inhaler  0  . amoxicillin (AMOXIL) 875 MG tablet Take 1 tablet (875 mg total) by mouth 2 (two) times daily.  20 tablet  0  . atenolol (TENORMIN) 100 MG tablet Take 1 tablet (100 mg total) by mouth daily.  180 tablet  3  . azithromycin (ZITHROMAX) 250 MG tablet Take 2 po first day and then one po qd x 4 days  6 tablet  1  . benzonatate (TESSALON) 100 MG capsule Take 1 capsule (100 mg total) by mouth 2 (two) times daily as needed for cough.  20 capsule  0  . furosemide (LASIX) 20 MG tablet Take one po qd x 3 days prn severe swelling  30 tablet  0  . furosemide (LASIX) 40 MG tablet One po qd x 3 days prn  swelling  30 tablet  0  . hydrochlorothiazide (HYDRODIURIL) 25 MG tablet Take 1 tablet (25 mg total) by mouth daily.  90 tablet  3  . methadone (DOLOPHINE) 10 MG tablet Take 10 mg by mouth 2 (two) times daily.      Marland Kitchen oxycodone (OXY-IR) 5 MG capsule Take 5 mg by mouth daily.      . predniSONE (DELTASONE) 10 MG tablet Take 4 po qd x 2days then 2 po qd x 2days then one po qd x 2 days  14 tablet  0  . SUMAtriptan (IMITREX) 100 MG tablet Take 1 tablet (100 mg total) by mouth every 2 (two) hours as needed for migraine. May repeat in 2 hours if headache persists or recurs.  15 tablet  99   Current Facility-Administered Medications  Medication Dose Route Frequency Provider Last Rate Last Dose  . levalbuterol (XOPENEX) nebulizer solution 1.25 mg  1.25 mg Nebulization Once Deatra Canter, FNP       Past Medical History  Diagnosis Date  . Hypertension   . Back pain     Family History  Problem Relation Age of Onset  . Heart  disease Mother   . Heart disease Father   . Diabetes Brother     History   Social History  . Marital Status: Married    Spouse Name: N/A    Number of Children: N/A  . Years of Education: N/A   Occupational History  . Not on file.   Social History Main Topics  . Smoking status: Former Smoker    Types: Cigarettes    Start date: 01/04/1967    Quit date: 01/03/1969  . Smokeless tobacco: Current User  . Alcohol Use: No  . Drug Use: No  . Sexual Activity: Not on file   Other Topics Concern  . Not on file   Social History Narrative  . No narrative on file    Past Surgical History  Procedure Laterality Date  . Back surgery    . Elbow surgery    . Shoulder surgery    . Wrist surgery        (Not in a hospital admission)  Review of systems complete and found to be negative unless listed above .  No nausea, vomiting.  No fever chills, No focal weakness,  No palpitations.  Physical Exam: Filed Vitals:   12/06/12 1045  BP: 100/60  Pulse: 56    Weight:  247 lb (112.038 kg)  Physical exam:  [place]/AT EOMI No JVD, No carotid bruit RRR S1S2  No wheezing Soft. NT, nondistended Bilateral pitting lower extremity edema. No focal motor or sensory deficits Normal affect  Labs:   Lab Results  Component Value Date   WBC 8.3 11/22/2012   HGB 14.7 11/22/2012   HCT 45.1 11/22/2012   MCV 81.9 11/22/2012   PLT 246 06/08/2010   No results found for this basename: NA, K, CL, CO2, BUN, CREATININE, CALCIUM, LABALBU, PROT, BILITOT, ALKPHOS, ALT, AST, GLUCOSE,  in the last 168 hours No results found for this basename: CKTOTAL, CKMB, CKMBINDEX, TROPONINI    No results found for this basename: CHOL   Lab Results  Component Value Date   HDL 38* 10/25/2012   Lab Results  Component Value Date   LDLCALC 81 10/25/2012   Lab Results  Component Value Date   TRIG 79 10/25/2012   Lab Results  Component Value Date   CHOLHDL 3.6 10/25/2012   No results found for this basename: LDLDIRECT      Radiology:reviewed CT scan report from August 2013. There is some calcified granulomas. There were no central endobronchial abnormalities, pleural plaques or adenopathy. Heart size was normal. OZH:YQMVHQ sinus rhythm, nonspecific ST-T wave changes in the inferolateral leads  ASSESSMENT AND PLAN:  1) shortness of breath: BNP was normal. Will check echocardiogram to evaluate for structural heart disease. Continue Lasix 40 mg daily for now. We'll check bmet.  Stop hydrochlorothiazide to avoid significant drops in potassium or sodium given that he is already on Lasix. Blood pressure is already well controlled. He feels more of a diuretic effect from the Lasix.  2) chest discomfort: Some atypical features. It is not really related to exertion, but has bothered him enough to nearly come to the emergency room. Shortness of breath could also be an anginal equivalent. We'll check nuclear stress test given his mildly abnormal ECG.  3) edema: BNP was normal. I encouraged him  to elevate his legs. We could consider compression stockings. Continue Lasix for now.  4) wife was also concerned about side effects from his methadone. I'm not sure if this is contributing to his current symptomatology. I will  defer to his primary care doctor and pulmonologist. Signed:   Fredric Mare, MD, [facility] 12/06/2012, 11:37 AM

## 2012-12-10 ENCOUNTER — Telehealth: Payer: Self-pay | Admitting: Cardiology

## 2012-12-10 MED ORDER — POTASSIUM CHLORIDE CRYS ER 20 MEQ PO TBCR: 20.0000 meq | EXTENDED_RELEASE_TABLET | Freq: Every day | ORAL | Status: DC

## 2012-12-10 NOTE — Telephone Encounter (Signed)
Message copied by [name] on Mon Dec 10, 2012  8:04 AM ------      Message from: Corky Crafts      Created: Fri Dec 07, 2012  6:03 PM       Potassium low.  Start KCl 20 mEq daily.  Disp 30, RF 11 ------

## 2012-12-10 NOTE — Telephone Encounter (Signed)
Refilled potassium to local pharm and mailorder. Pt notified to start potassium today.

## 2012-12-13 ENCOUNTER — Telehealth: Payer: Self-pay | Admitting: Cardiology

## 2012-12-13 NOTE — Telephone Encounter (Signed)
Pt is symptomatic. Stress test and Echo moved up to 12/17/12. Pt aware. FYI to Dr. Eldridge Dace.

## 2012-12-13 NOTE — Telephone Encounter (Signed)
Any recommendations? Pt has echo and stress test on 12/25/12

## 2012-12-13 NOTE — Telephone Encounter (Signed)
Stephen Lawson - FW: Questionnaire Submission ','<More Detail >>          FW: Questionnaire Submission  [name], RN  MRN: [id number] DOB: [birth year]     Pt Home: [phone]     Sent: Thu December 13, 2012 1:23 PM   Entered: [phone]     To: [name], CMA                               Current View: Showing all answers Show Only Relevant Answers    Legend: Scores, Non-relevant Questions     Patient Responses     Chl Mychart After Visit Questionnaire    Question 12/13/2012 11:51 AM   How are you feeling after your recent visit? not good   Does the recommended course of treatment seem to be helping your symptoms? no   Are you experiencing any side effects from your recommended treatment? no   is there anything else you would like to ask your physician? On the 6th I had a real problem breathing and started sweating profusely. When I walk from my car into the office at work I am short of breath. Can I be seen earlier?         Message          ----- Message -----   From: Stephen Lawson   Sent: 12/13/2012 11:51 AM   To: [address] Triage   Subject: Questionnaire Submission       Patient Questionnaire Submission   --------------------------------      Questionnaire: Questionnaire      Question: How are you feeling after your recent visit?   Answer: not good      Question: Does the recommended course of treatment seem to be helping your symptoms?   Answer: no      Question: Are you experiencing any side effects from your recommended treatment?   Answer: no      Question: is there anything else you would like to ask your physician?   Answer: On the 6th I had a real problem breathing and started sweating profusely. When I walk from my car into the office at work I am short of breath. Can I be seen earlier?      ----- Message -----   From: Corky Crafts., MD   Sent: 12/06/2012 12:10 PM   To: Stephen Lawson   Subject: Questionnaire        To ensure we are providing you the highest quality healthcare, we'd like to know how you are feeling after your recent visit. At your earliest convenience, please complete the brief follow-up assessment by clicking the Task: Questionnaire link listed above.      Thank you for your time in helping [place] improve our services and for partnering with [place] in your wellness and care.      Sincerely,      Your Care Team

## 2012-12-17 ENCOUNTER — Encounter: Payer: Self-pay | Admitting: Cardiovascular Disease

## 2012-12-17 ENCOUNTER — Ambulatory Visit (HOSPITAL_BASED_OUTPATIENT_CLINIC_OR_DEPARTMENT_OTHER): Payer: 59 | Admitting: Radiology

## 2012-12-17 ENCOUNTER — Ambulatory Visit (HOSPITAL_COMMUNITY)
Admission: RE | Admit: 2012-12-17 | Discharge: 2012-12-17 | Disposition: A | Discharge status: Home / Self Care | Payer: 59 | Source: Ambulatory Visit | Attending: Family Medicine | Admitting: Family Medicine

## 2012-12-17 VITALS — BP 139/72 | Ht 71.5 in | Wt 250.0 lb

## 2012-12-17 DIAGNOSIS — I10 Essential (primary) hypertension: Secondary | ICD-10-CM | POA: Insufficient documentation

## 2012-12-17 DIAGNOSIS — R9431 Abnormal electrocardiogram [ECG] [EKG]: Secondary | ICD-10-CM | POA: Insufficient documentation

## 2012-12-17 DIAGNOSIS — R0609 Other forms of dyspnea: Secondary | ICD-10-CM

## 2012-12-17 DIAGNOSIS — Z8249 Family history of ischemic heart disease and other diseases of the circulatory system: Secondary | ICD-10-CM | POA: Insufficient documentation

## 2012-12-17 DIAGNOSIS — R0989 Other specified symptoms and signs involving the circulatory and respiratory systems: Secondary | ICD-10-CM

## 2012-12-17 DIAGNOSIS — Z77090 Contact with and (suspected) exposure to asbestos: Secondary | ICD-10-CM | POA: Insufficient documentation

## 2012-12-17 DIAGNOSIS — R0602 Shortness of breath: Secondary | ICD-10-CM

## 2012-12-17 DIAGNOSIS — Z87891 Personal history of nicotine dependence: Secondary | ICD-10-CM | POA: Insufficient documentation

## 2012-12-17 DIAGNOSIS — R079 Chest pain, unspecified: Secondary | ICD-10-CM | POA: Insufficient documentation

## 2012-12-17 DIAGNOSIS — R609 Edema, unspecified: Secondary | ICD-10-CM

## 2012-12-17 MED ORDER — REGADENOSON 0.4 MG/5ML IV SOLN
0.4000 mg | Freq: Once | INTRAVENOUS | Status: AC
  Administered 2012-12-17: 0.4 mg via INTRAVENOUS

## 2012-12-17 MED ORDER — TECHNETIUM TC 99M SESTAMIBI GENERIC - CARDIOLITE
30.0000 | Freq: Once | INTRAVENOUS | Status: AC | PRN
  Administered 2012-12-17: 30 via INTRAVENOUS

## 2012-12-17 MED ORDER — TECHNETIUM TC 99M SESTAMIBI GENERIC - CARDIOLITE
10.0000 | Freq: Once | INTRAVENOUS | Status: AC | PRN
  Administered 2012-12-17: 10 via INTRAVENOUS

## 2012-12-17 NOTE — Progress Notes (Signed)
  Echocardiogram 2D Echocardiogram has been performed.  Stephen Lawson 12/17/2012, 8:41 AM

## 2012-12-17 NOTE — Progress Notes (Signed)
[facility] SITE 3 NUCLEAR MED [address] [place], [place] [address] [phone]    Cardiology Nuclear Med Study  Stephen Lawson is a 62 y.o. male     MRN : [id number]     DOB: 04-13-1950  Procedure Date: 12/17/2012  Nuclear Med Background Indication for Stress Test:  Evaluation for Ischemia and Abnormal EKG History:  Asbestos exposure(followed by Pulmonologist) Cardiac Risk Factors: Family History - CAD, History of Smoking and Hypertension  Symptoms:  Chest Pain, DOE and SOB   Nuclear Pre-Procedure Caffeine/Decaff Intake:  None> 12hours NPO After: 7:00pm   Lungs:  clear O2 Sat: 94% on room air. IV 0.9% NS with Angio Cath:  22g  IV Site: R Antecubital  IV Started by:  Rickard Patience  Chest Size (in):  48 Cup Size: n/a  Height: 5' 11.5" (1.816 m)  Weight:  250 lb (113.399 kg)  BMI:  Body mass index is 34.39 kg/(m^2). Tech Comments:  Held Atenolol    Nuclear Med Study 1 or 2 day study: 1 day  Stress Test Type:  Lexiscan  Reading MD: Lance Muss, MD  Order Authorizing Provider:  Tonna Corner  Resting Radionuclide: Technetium 59m Sestamibi  Resting Radionuclide Dose: 11.0 mCi   Stress Radionuclide:  Technetium 43m Sestamibi  Stress Radionuclide Dose: 33.0 mCi           Stress Protocol Rest HR: 63 Stress HR: 99  Rest BP: 139/72 Stress BP: 143/75  Exercise Time (min): n/a METS: n/a   Predicted Max HR: 159 bpm % Max HR: 62.26 bpm Rate Pressure Product: 29528   Dose of Adenosine (mg):  n/a Dose of Lexiscan: 0.4 mg  Dose of Atropine (mg): n/a Dose of Dobutamine: n/a mcg/kg/min (at max HR)  Stress Test Technologist: Frederick Peers, EMT-P  Nuclear Technologist:  Domenic Polite, CNMT     Rest Procedure:  Myocardial perfusion imaging was performed at rest 45 minutes following the intravenous administration of Technetium 80m Sestamibi. Rest ECG: NSR with non-specific ST-T wave changes  Stress Procedure:  The patient received IV Lexiscan 0.4 mg  over 15-seconds.  Technetium 3m Sestamibi injected at 30-seconds.  Quantitative spect images were obtained after a 45 minute delay. Stress ECG: There are scattered PVCs.No significant change.  QPS Raw Data Images:  Mild diaphragmatic attenuation.  Normal left ventricular size. Stress Images:  Normal homogeneous uptake in all areas of the myocardium. Rest Images:  There is decreased uptake in the inferior wall. Subtraction (SDS):  No evidence of ischemia. Transient Ischemic Dilatation (Normal <1.22):  1.02 Lung/Heart Ratio (Normal <0.45):  0.43  Quantitative Gated Spect Images QGS EDV:  NA QGS ESV: NA  Impression Exercise Capacity:  Lexiscan with no exercise. BP Response:  Normal blood pressure response. Clinical Symptoms:  There is dyspnea. ECG Impression:  No significant ST segment change suggestive of ischemia. Comparison with Prior Nuclear Study: No images to compare  Overall Impression:  Low risk stress nuclear study . No clear evidence of ischemia..  LV Ejection Fraction: Study not gated.  LV Wall Motion: NA  Corky Crafts., MD, [facility]

## 2012-12-19 ENCOUNTER — Telehealth: Payer: Self-pay | Admitting: *Deleted

## 2012-12-19 ENCOUNTER — Other Ambulatory Visit: Payer: Self-pay | Admitting: Family Medicine

## 2012-12-19 ENCOUNTER — Encounter: Payer: Self-pay | Admitting: Interventional Cardiology

## 2012-12-19 NOTE — Telephone Encounter (Signed)
Message copied by Barrie Folk on Wed Dec 19, 2012  9:31 AM ------      Message from: Corky Crafts      Created: Tue Dec 18, 2012  5:55 PM       Normal cardiac function. Normal valvular function.  No cardiac etiology of shortness of breath identified. ------

## 2012-12-19 NOTE — Telephone Encounter (Signed)
Wife is aware of echo results. Awaiting results from stress test performed on 12/19/12 Mylo Red RN

## 2012-12-21 ENCOUNTER — Telehealth: Payer: Self-pay | Admitting: Interventional Cardiology

## 2012-12-21 NOTE — Telephone Encounter (Signed)
New problem    Pt called for the results of his test Echo/ Myo card on 12/15.  Please give pt a call back at home then cell please.

## 2012-12-21 NOTE — Telephone Encounter (Signed)
Called pt and gave him myoview results.

## 2012-12-24 ENCOUNTER — Telehealth: Payer: Self-pay | Admitting: Cardiology

## 2012-12-24 DIAGNOSIS — R600 Localized edema: Secondary | ICD-10-CM

## 2012-12-24 NOTE — Telephone Encounter (Signed)
Pt notified. Pt will come for doppler tomorrow at 3:30pm.

## 2012-12-24 NOTE — Telephone Encounter (Signed)
Message copied by [name] on Mon Dec 24, 2012 10:37 AM ------      Message from: Corky Crafts      Created: Mon Dec 24, 2012 10:35 AM       Can check LE venous Doppler if this has not been done recently to eval LE swelling ------

## 2012-12-25 ENCOUNTER — Encounter (HOSPITAL_COMMUNITY): Payer: 59

## 2012-12-25 ENCOUNTER — Other Ambulatory Visit (HOSPITAL_COMMUNITY): Payer: 59

## 2012-12-25 ENCOUNTER — Encounter: Payer: Self-pay | Admitting: Cardiology

## 2012-12-25 ENCOUNTER — Ambulatory Visit (HOSPITAL_COMMUNITY): Payer: 59 | Attending: Interventional Cardiology

## 2012-12-25 DIAGNOSIS — I10 Essential (primary) hypertension: Secondary | ICD-10-CM | POA: Insufficient documentation

## 2012-12-25 DIAGNOSIS — R609 Edema, unspecified: Secondary | ICD-10-CM | POA: Insufficient documentation

## 2012-12-25 DIAGNOSIS — R011 Cardiac murmur, unspecified: Secondary | ICD-10-CM

## 2012-12-25 DIAGNOSIS — Z77090 Contact with and (suspected) exposure to asbestos: Secondary | ICD-10-CM | POA: Insufficient documentation

## 2012-12-25 DIAGNOSIS — R600 Localized edema: Secondary | ICD-10-CM

## 2012-12-25 DIAGNOSIS — F17200 Nicotine dependence, unspecified, uncomplicated: Secondary | ICD-10-CM | POA: Insufficient documentation

## 2013-01-14 ENCOUNTER — Telehealth: Payer: Self-pay | Admitting: Family Medicine

## 2013-01-14 ENCOUNTER — Ambulatory Visit (INDEPENDENT_AMBULATORY_CARE_PROVIDER_SITE_OTHER): Payer: 59 | Admitting: Family Medicine

## 2013-01-14 VITALS — BP 122/63 | HR 58 | Ht 71.0 in | Wt 249.0 lb

## 2013-01-14 DIAGNOSIS — J209 Acute bronchitis, unspecified: Secondary | ICD-10-CM

## 2013-01-14 DIAGNOSIS — J61 Pneumoconiosis due to asbestos and other mineral fibers: Secondary | ICD-10-CM

## 2013-01-14 DIAGNOSIS — R0602 Shortness of breath: Secondary | ICD-10-CM

## 2013-01-14 DIAGNOSIS — J40 Bronchitis, not specified as acute or chronic: Secondary | ICD-10-CM

## 2013-01-14 MED ORDER — ALBUTEROL SULFATE HFA 108 (90 BASE) MCG/ACT IN AERS: 2.0000 | INHALATION_SPRAY | Freq: Four times a day (QID) | RESPIRATORY_TRACT | Status: DC | PRN

## 2013-01-14 MED ORDER — LEVALBUTEROL HCL 1.25 MG/0.5ML IN NEBU: 1.2500 mg | INHALATION_SOLUTION | Freq: Once | RESPIRATORY_TRACT | Status: DC

## 2013-01-14 MED ORDER — AMOXICILLIN 875 MG PO TABS: 875.0000 mg | ORAL_TABLET | Freq: Two times a day (BID) | ORAL | Status: DC

## 2013-01-14 MED ORDER — BENZONATATE 100 MG PO CAPS: 100.0000 mg | ORAL_CAPSULE | Freq: Two times a day (BID) | ORAL | Status: DC | PRN

## 2013-01-14 MED ORDER — METHYLPREDNISOLONE (PAK) 4 MG PO TABS: ORAL_TABLET | ORAL | Status: DC

## 2013-01-14 NOTE — Progress Notes (Signed)
   Subjective:    Patient ID: Stephen Lawson, male    DOB: 12-10-1950, 63 y.o.   MRN: [id number]  HPI This 63 y.o. male presents for evaluation of Shortness of breath and congestion.  He has Some night time coughing and wheezing.  He has recently had a cardiac work up and had  Stress test which was negative.  He feels like he has a band around his chest.  He has DOE And feels short of breath with daily activities.  He has had nuclear stress test, doppler of LE's, Echocardiogram and cardiolgy consult which were normal.  He has hx of asbestosis exposure And has seen a pulmonologist for this a year ago.     Review of Systems C/o uri sx's, sob, and cough and wheezing. No chest pain, SOB, HA, dizziness, vision change, N/V, diarrhea, constipation, dysuria, urinary urgency or frequency, myalgias, arthralgias or rash.     Objective:   Physical Exam  Filed Vitals:   01/14/13 0835  BP: 122/63  Pulse: 58  Height: 5\' 11"  (1.803 m)  Weight: 249 lb (112.946 kg)   Vital signs noted  Well developed well nourished male.  HEENT - Head atraumatic Normocephalic                Eyes - PERRLA, Conjuctiva - clear Sclera- Clear EOMI                Ears - EAC's Wnl TM's Wnl Gross Hearing WNL                Nose - Nares patent                 Throat - oropharanx wnl Respiratory - Lungs diminished bilateral oxygen saturation 95% on room air. Cardiac - RRR S1 and S2 without murmur GI - Abdomen soft Nontender and bowel sounds active x 4 Extremities - No edema. Neuro - Grossly intact.  Post neb tx - Lungs CTA a/p and patient states he feels less SOB.      Assessment & Plan:  Bronchitis - Plan: levalbuterol (XOPENEX) nebulizer solution 1.25 mg  Shortness of breath - Plan: Ambulatory referral to Pulmonology, amoxicillin (AMOXIL) 875 MG tablet, albuterol (PROVENTIL HFA;VENTOLIN HFA) 108 (90 BASE) MCG/ACT inhaler.  Check [facility]  Antitrypsin screening test.  Acute bronchitis - Plan: amoxicillin  (AMOXIL) 875 MG tablet, albuterol (PROVENTIL HFA;VENTOLIN HFA) 108 (90 BASE) MCG/ACT inhaler, methylPREDNIsolone (MEDROL DOSPACK) 4 MG tablet, benzonatate (TESSALON) 100 MG capsule.  Push po fluids, rest, tylenol and motrin otc prn as directed for fever, arthralgias, and myalgias.  Follow up prn if sx's continue or persist.  Lysbeth Penner FNP

## 2013-01-14 NOTE — Patient Instructions (Signed)

## 2013-01-17 ENCOUNTER — Other Ambulatory Visit: Payer: Self-pay | Admitting: Family Medicine

## 2013-01-18 MED ORDER — FUROSEMIDE 40 MG PO TABS
40.0000 mg | ORAL_TABLET | Freq: Every day | ORAL | Status: DC
Start: 2013-01-18 — End: 2013-07-06

## 2013-01-18 NOTE — Telephone Encounter (Signed)
Refill sent.

## 2013-01-22 ENCOUNTER — Ambulatory Visit (INDEPENDENT_AMBULATORY_CARE_PROVIDER_SITE_OTHER): Payer: 59 | Admitting: Pulmonary Disease

## 2013-01-22 ENCOUNTER — Encounter: Payer: Self-pay | Admitting: Pulmonary Disease

## 2013-01-22 ENCOUNTER — Ambulatory Visit (INDEPENDENT_AMBULATORY_CARE_PROVIDER_SITE_OTHER)
Admission: RE | Admit: 2013-01-22 | Discharge: 2013-01-22 | Disposition: A | Discharge status: Home / Self Care | Payer: 59 | Source: Ambulatory Visit | Attending: Pulmonary Disease | Admitting: Pulmonary Disease

## 2013-01-22 VITALS — BP 132/74 | HR 68 | Ht 71.0 in | Wt 249.0 lb

## 2013-01-22 DIAGNOSIS — R0602 Shortness of breath: Secondary | ICD-10-CM

## 2013-01-22 DIAGNOSIS — R1310 Dysphagia, unspecified: Secondary | ICD-10-CM | POA: Insufficient documentation

## 2013-01-22 NOTE — Assessment & Plan Note (Signed)
I worry about this dysphagia as a potential cause of aspiration.  Plan: -barium swallow

## 2013-01-22 NOTE — Assessment & Plan Note (Signed)
He has ongoing dyspnea on exertion of uncertain etiology.  On exam he has some crackles in the right base and notes dysphagia, so I worry first about chronic aspiration vs pulmonary fibrosis.  He has a history of asbestos exposure so we need to worry about asbestos.  He does not have COPD based on today's normal spirometry.  The ddx of dyspnea is broad and includes cardiac problems, hematologic problems and neurologic problems.  His recent cardiac work up was normal and his neurologic exam today was normal.    Plan: -full PFT -CT chest, ILD protocol -barium swallow -obtain records from Dr. Pearlie Oyster (occupational health specialist who has followed him for asbestos exposure)

## 2013-01-22 NOTE — Progress Notes (Signed)
Subjective:    Patient ID: Stephen Lawson, male    DOB: Aug 26, 1950, 63 y.o.   MRN: [id number]  HPI  Stephen Lawson says that he has asbestosis which was diagnosed ten years ago.  He said that he was diagnosed by Dr. Charlett Blake in [place].  He has had multiple CT scans and pulmonary function testing.    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    He has noticed dyspnea on exertion for 6-12 months which is quite bothersome.  He can't do much activity without feeling dyspnea.  He feels a strange sensation in his chest that feels like pressure in his chest.  He sleeps in a recliner every night because of chronic back problems.  He can't lie back without getting dyspnic and developed along with the dyspnea on exertion.  He has dyspnea even with talking.  He was referred to [place] for evaluation of shortness of breath by his PA. He first had a stress test which was normal and an echocardiogram which showed a normal LVEF and RV.  He only smoked a little when he was a teenager.  He does not have a bad cough.  He will intermittently get cough with shortness of breath.  He often notices food getting stuck mid chest when he swallows.   Past Medical History  Diagnosis Date  . Hypertension   . Back pain      Family History  Problem Relation Age of Onset  . Heart disease Mother   . Heart disease Father   . Diabetes Brother      History   Social History  . Marital Status: Married    Spouse Name: N/A    Number of Children: N/A  . Years of Education: N/A   Occupational History  . Not on file.   Social History Main  Topics  . Smoking status: Former Smoker -- 0.50 packs/day    Types: Cigarettes    Start date: 01/04/1967    Quit date: 01/03/1969  . Smokeless tobacco: Current User  . Alcohol Use: No  . Drug Use: No  . Sexual Activity: Not on file   Other Topics Concern  . Not on file   Social History Narrative  . No narrative on file     Allergies  Allergen Reactions  . Darvocet [Propoxyphene N-Acetaminophen]     Itching   . Talwin [Pentazocine]     itching  . Ultram [Tramadol]      Outpatient Prescriptions Prior to Visit  Medication Sig Dispense Refill  . albuterol (PROVENTIL HFA;VENTOLIN HFA) 108 (90 BASE) MCG/ACT inhaler Inhale 2 puffs into the lungs every 6 (six) hours as needed for wheezing or shortness of breath.  1 Inhaler  0  . amoxicillin (AMOXIL) 875 MG tablet Take 1 tablet (875 mg total) by mouth 2 (two) times daily.  20 tablet  0  . benzonatate (TESSALON) 100 MG capsule Take 1 capsule (100 mg total) by mouth 2 (two) times daily as needed for cough.  20 capsule  0  . furosemide (LASIX) 40 MG tablet Take 1  tablet (40 mg total) by mouth daily.  90 tablet  1  . oxycodone (OXY-IR) 5 MG capsule Take 5 mg by mouth daily.      . potassium chloride SA (K-DUR,KLOR-CON) 20 MEQ tablet Take 1 tablet (20 mEq total) by mouth daily.  90 tablet  3  . SUMAtriptan (IMITREX) 100 MG tablet Take 1 tablet (100 mg total) by mouth every 2 (two) hours as needed for migraine. May repeat in 2 hours if headache persists or recurs.  15 tablet  99  . atenolol (TENORMIN) 100 MG tablet Take 1 tablet (100 mg total) by mouth daily.  180 tablet  3  . methadone (DOLOPHINE) 10 MG tablet Take 10 mg by mouth 2 (two) times daily.      . methylPREDNIsolone (MEDROL DOSPACK) 4 MG tablet follow package directions  21 tablet  0   Facility-Administered Medications Prior to Visit  Medication Dose Route Frequency Provider Last Rate Last Dose  . levalbuterol (XOPENEX) nebulizer solution 1.25 mg  1.25 mg Nebulization Once  Deatra CanterWilliam J Oxford, FNP      . levalbuterol Pauline Aus(XOPENEX) nebulizer solution 1.25 mg  1.25 mg Nebulization Once Deatra CanterWilliam J Oxford, FNP           Review of Systems  Constitutional: Negative for fever and unexpected weight change.  HENT: Positive for congestion. Negative for dental problem, ear pain, nosebleeds, postnasal drip, rhinorrhea, sinus pressure, sneezing, sore throat and trouble swallowing.   Eyes: Negative for redness and itching.  Respiratory: Positive for cough, shortness of breath and wheezing. Negative for chest tightness.   Cardiovascular: Negative for palpitations and leg swelling.  Gastrointestinal: Negative for nausea and vomiting.  Genitourinary: Negative for dysuria.  Musculoskeletal: Negative for joint swelling.  Skin: Negative for rash.  Neurological: Negative for headaches.  Hematological: Does not bruise/bleed easily.  Psychiatric/Behavioral: Negative for dysphoric mood. The patient is not nervous/anxious.        Objective:   Physical Exam Filed Vitals:   01/22/13 1433  BP: 132/74  Pulse: 68  Height: 5\' 11"  (1.803 m)  Weight: 249 lb (112.946 kg)  SpO2: 95%   RA  Gen: well appearing, no acute distress HEENT: NCAT, PERRL, EOMi, OP clear, neck supple without masses PULM: CTA B CV: RRR, no mgr, no JVD AB: BS+, soft, nontender, no hsm Ext: warm, no edema, no clubbing, no cyanosis Derm: no rash or skin breakdown Neuro: A&Ox4, CN II-XII intact, strength 5/5 in all 4 extremities       Assessment & Plan:   Shortness of breath He has ongoing dyspnea on exertion of uncertain etiology.  On exam he has some crackles in the right base and notes dysphagia, so I worry first about chronic aspiration vs pulmonary fibrosis.  He has a history of asbestos exposure so we need to worry about asbestos.  He does not have COPD based on today's normal spirometry.  The ddx of dyspnea is broad and includes cardiac problems, hematologic problems and neurologic problems.  His  recent cardiac work up was normal and his neurologic exam today was normal.    Plan: -full PFT -CT chest, ILD protocol -barium swallow -obtain records from Dr. Valorie RooseveltProctor (occupational health specialist who has followed him for asbestos exposure)  Dysphagia, unspecified(787.20) I worry about this dysphagia as a potential cause of aspiration.  Plan: -barium swallow   Updated Medication List Outpatient Encounter Prescriptions as of 01/22/2013  Medication Sig  . albuterol (PROVENTIL HFA;VENTOLIN HFA) 108 (90 BASE) MCG/ACT inhaler Inhale 2  puffs into the lungs every 6 (six) hours as needed for wheezing or shortness of breath.  Marland Kitchen amoxicillin (AMOXIL) 875 MG tablet Take 1 tablet (875 mg total) by mouth 2 (two) times daily.  Marland Kitchen atenolol (TENORMIN) 100 MG tablet Take 100 mg by mouth 2 (two) times daily.  . benzonatate (TESSALON) 100 MG capsule Take 1 capsule (100 mg total) by mouth 2 (two) times daily as needed for cough.  . furosemide (LASIX) 40 MG tablet Take 1 tablet (40 mg total) by mouth daily.  . methadone (DOLOPHINE) 10 MG tablet Take 10 mg by mouth 2 (two) times daily.  Marland Kitchen oxycodone (OXY-IR) 5 MG capsule Take 5 mg by mouth daily.  . potassium chloride SA (K-DUR,KLOR-CON) 20 MEQ tablet Take 1 tablet (20 mEq total) by mouth daily.  . SUMAtriptan (IMITREX) 100 MG tablet Take 1 tablet (100 mg total) by mouth every 2 (two) hours as needed for migraine. May repeat in 2 hours if headache persists or recurs.  . [DISCONTINUED] atenolol (TENORMIN) 100 MG tablet Take 1 tablet (100 mg total) by mouth daily.  . [DISCONTINUED] methadone (DOLOPHINE) 10 MG tablet Take 10 mg by mouth 2 (two) times daily.  . [DISCONTINUED] methylPREDNIsolone (MEDROL DOSPACK) 4 MG tablet follow package directions

## 2013-01-22 NOTE — Patient Instructions (Signed)
We will set up a CT chest, pulmonary functionctesting  and a barium swallow. We will request records from Dr. Rolley Sims office.   We will see you back in 4 weeks or sooner if needed

## 2013-01-23 ENCOUNTER — Telehealth: Payer: Self-pay

## 2013-01-23 NOTE — Telephone Encounter (Signed)
Pt aware of cxr results.  Nothing further needed. 

## 2013-01-23 NOTE — Telephone Encounter (Signed)
Message copied by Len Blalock on Wed Jan 23, 2013  2:25 PM ------      Message from: Simonne Maffucci B      Created: Wed Jan 23, 2013  8:30 AM       A,'      Please let him know this was normal      Thanks      b ------

## 2013-01-24 ENCOUNTER — Telehealth: Payer: Self-pay

## 2013-01-24 NOTE — Telephone Encounter (Signed)
Message copied by Len Blalock on Thu Jan 24, 2013  4:53 PM ------      Message from: Simonne Maffucci B      Created: Wed Jan 23, 2013  8:30 AM       A,'      Please let him know this was normal      Thanks      b ------

## 2013-01-24 NOTE — Telephone Encounter (Signed)
Pt aware of cxr results, nothing further needed.

## 2013-01-25 ENCOUNTER — Telehealth: Payer: Self-pay | Admitting: Family Medicine

## 2013-01-25 ENCOUNTER — Encounter: Payer: Self-pay | Admitting: Pulmonary Disease

## 2013-01-25 DIAGNOSIS — Z1211 Encounter for screening for malignant neoplasm of colon: Secondary | ICD-10-CM

## 2013-01-25 NOTE — Telephone Encounter (Signed)
Needs screening colonoscopy with Dr. Watt Climes. Referral placed.

## 2013-01-28 ENCOUNTER — Telehealth: Payer: Self-pay

## 2013-01-28 NOTE — Telephone Encounter (Signed)
Peer to Peer for ct chest approval code: [place] [id number].  Speak with Caryl Pina if you have any questions.

## 2013-01-29 ENCOUNTER — Ambulatory Visit (HOSPITAL_COMMUNITY)
Admission: RE | Admit: 2013-01-29 | Discharge: 2013-01-29 | Disposition: A | Discharge status: Home / Self Care | Payer: 59 | Source: Ambulatory Visit | Attending: Pulmonary Disease | Admitting: Pulmonary Disease

## 2013-01-29 ENCOUNTER — Ambulatory Visit (HOSPITAL_COMMUNITY): Payer: 59

## 2013-01-29 ENCOUNTER — Ambulatory Visit (INDEPENDENT_AMBULATORY_CARE_PROVIDER_SITE_OTHER)
Admission: RE | Admit: 2013-01-29 | Discharge: 2013-01-29 | Disposition: A | Discharge status: Home / Self Care | Payer: 59 | Source: Ambulatory Visit | Attending: Pulmonary Disease | Admitting: Pulmonary Disease

## 2013-01-29 DIAGNOSIS — R0602 Shortness of breath: Secondary | ICD-10-CM

## 2013-01-29 DIAGNOSIS — R1310 Dysphagia, unspecified: Secondary | ICD-10-CM | POA: Insufficient documentation

## 2013-01-30 ENCOUNTER — Encounter: Payer: Self-pay | Admitting: Pulmonary Disease

## 2013-01-30 ENCOUNTER — Telehealth: Payer: Self-pay

## 2013-01-30 NOTE — Telephone Encounter (Signed)
Pt aware of barium swallow results and ct chest results.  Nothing further needed at this time.

## 2013-01-30 NOTE — Telephone Encounter (Signed)
Message copied by Len Blalock on Wed Jan 30, 2013 10:09 AM ------      Message from: Simonne Maffucci B      Created: Wed Jan 30, 2013  6:15 AM       A,            Please let him know this was normal            Thanks      B ------

## 2013-02-05 ENCOUNTER — Ambulatory Visit (HOSPITAL_COMMUNITY)
Admission: RE | Admit: 2013-02-05 | Discharge: 2013-02-05 | Disposition: A | Discharge status: Home / Self Care | Payer: 59 | Source: Ambulatory Visit | Attending: Pulmonary Disease | Admitting: Pulmonary Disease

## 2013-02-05 DIAGNOSIS — J988 Other specified respiratory disorders: Secondary | ICD-10-CM | POA: Insufficient documentation

## 2013-02-05 DIAGNOSIS — R0989 Other specified symptoms and signs involving the circulatory and respiratory systems: Secondary | ICD-10-CM | POA: Insufficient documentation

## 2013-02-05 DIAGNOSIS — R0609 Other forms of dyspnea: Secondary | ICD-10-CM | POA: Insufficient documentation

## 2013-02-05 DIAGNOSIS — R0602 Shortness of breath: Secondary | ICD-10-CM

## 2013-02-05 LAB — PULMONARY FUNCTION TEST
DL/VA % PRED: 101 %
DL/VA: 4.77 ml/min/mmHg/L
DLCO UNC: 26.76 ml/min/mmHg
DLCO unc % pred: 79 %
FEF 25-75 PRE: 2.8 L/s
FEF 25-75 Post: 3.45 L/sec
FEF2575-%Change-Post: 23 %
FEF2575-%PRED-PRE: 93 %
FEF2575-%Pred-Post: 115 %
FEV1-%CHANGE-POST: 3 %
FEV1-%Pred-Post: 90 %
FEV1-%Pred-Pre: 86 %
FEV1-Post: 3.32 L
FEV1-Pre: 3.2 L
FEV1FVC-%Change-Post: 4 %
FEV1FVC-%Pred-Pre: 105 %
FEV6-%CHANGE-POST: 0 %
FEV6-%PRED-POST: 86 %
FEV6-%PRED-PRE: 87 %
FEV6-PRE: 4.06 L
FEV6-Post: 4.04 L
FEV6FVC-%Change-Post: 0 %
FEV6FVC-%PRED-POST: 105 %
FEV6FVC-%PRED-PRE: 105 %
FVC-%Change-Post: 0 %
FVC-%PRED-POST: 82 %
FVC-%Pred-Pre: 83 %
FVC-POST: 4.04 L
FVC-PRE: 4.06 L
Post FEV1/FVC ratio: 82 %
Post FEV6/FVC ratio: 100 %
Pre FEV1/FVC ratio: 79 %
Pre FEV6/FVC Ratio: 100 %
RV % pred: 75 %
RV: 1.76 L
TLC % pred: 79 %
TLC: 5.72 L

## 2013-02-05 MED ORDER — ALBUTEROL SULFATE (2.5 MG/3ML) 0.083% IN NEBU
2.5000 mg | INHALATION_SOLUTION | Freq: Once | RESPIRATORY_TRACT | Status: AC
  Administered 2013-02-05: 2.5 mg via RESPIRATORY_TRACT

## 2013-02-06 ENCOUNTER — Encounter: Payer: Self-pay | Admitting: Pulmonary Disease

## 2013-02-10 ENCOUNTER — Other Ambulatory Visit: Payer: Self-pay | Admitting: Family Medicine

## 2013-02-20 ENCOUNTER — Ambulatory Visit (INDEPENDENT_AMBULATORY_CARE_PROVIDER_SITE_OTHER): Payer: 59 | Admitting: Family Medicine

## 2013-02-20 VITALS — BP 131/80 | HR 65 | Temp 98.0°F | Ht 70.5 in | Wt 254.0 lb

## 2013-02-20 DIAGNOSIS — R609 Edema, unspecified: Secondary | ICD-10-CM

## 2013-02-20 NOTE — Progress Notes (Signed)
   Subjective:    Patient ID: Stephen Lawson, male    DOB: May 04, 1950, 63 y.o.   MRN: [id number]  HPI  This 63 y.o. male presents for evaluation of lower extremity edema.  He has been having Some severe swelling and he has been rx'd venous compression stockings but lost the rx. He is taking lasix 40mg  po qd.  He was taking HCTZ also but this was DC'd by his cardiologist.  Review of Systems C/o edema and numbness in his lower extremities No chest pain, SOB, HA, dizziness, vision change, N/V, diarrhea, constipation, dysuria, urinary urgency or frequency, myalgias, arthralgias or rash.     Objective:   Physical Exam  Vital signs noted  Well developed well nourished male.  HEENT - Head atraumatic Normocephalic                Eyes - PERRLA, Conjuctiva - clear Sclera- Clear EOMI                Ears - EAC's Wnl TM's Wnl Gross Hearing WNL                Throat - oropharanx wnl Respiratory - Lungs CTA bilateral Cardiac - RRR S1 and S2 without murmur GI - Abdomen soft Nontender and bowel sounds active x 4 Legs - bilateral pedal and pre-tibial edema      Assessment & Plan:  Edema Wear venous compression stockings and increase lasix 40mg  bid for 2-3 days. Follow up prn  Lysbeth Penner FNP

## 2013-02-25 ENCOUNTER — Ambulatory Visit (INDEPENDENT_AMBULATORY_CARE_PROVIDER_SITE_OTHER): Payer: 59 | Admitting: Pulmonary Disease

## 2013-02-25 ENCOUNTER — Encounter: Payer: Self-pay | Admitting: Pulmonary Disease

## 2013-02-25 VITALS — BP 134/78 | HR 60 | Temp 98.2°F | Ht 71.0 in | Wt 252.2 lb

## 2013-02-25 DIAGNOSIS — R0989 Other specified symptoms and signs involving the circulatory and respiratory systems: Secondary | ICD-10-CM

## 2013-02-25 DIAGNOSIS — R0602 Shortness of breath: Secondary | ICD-10-CM

## 2013-02-25 DIAGNOSIS — R0609 Other forms of dyspnea: Secondary | ICD-10-CM

## 2013-02-25 DIAGNOSIS — R0600 Dyspnea, unspecified: Secondary | ICD-10-CM

## 2013-02-25 DIAGNOSIS — J309 Allergic rhinitis, unspecified: Secondary | ICD-10-CM

## 2013-02-25 DIAGNOSIS — R1310 Dysphagia, unspecified: Secondary | ICD-10-CM

## 2013-02-25 NOTE — Assessment & Plan Note (Signed)
Minimal "flash" laryngeal penetration on barium swallow, but normal movement.  Perhaps this is the explanation of the mild interstitial changes on CT chest.

## 2013-02-25 NOTE — Assessment & Plan Note (Addendum)
There was no evidence of significant pulmonary fibrosis on the CT chest.  Surprisingly, there was no evidence of asbestos exposure either, making the likelihood of asbestosis very very low. Further, his PFTs were nearly completely normal.  Combined with his normal exam and oximetry, I am not coming up with a clear cause of dyspnea other than deconditioning and obesity.  However, because he feels the symptoms are worsening, we will order a cardiopulmonary stress test to try to figure out what is going on.  Plan: -CPET test -encouraged regular exercise -repeat CT chest in one year

## 2013-02-25 NOTE — Assessment & Plan Note (Signed)
Encouraged nasal steroid and saline

## 2013-02-25 NOTE — Patient Instructions (Signed)
Use Neil Med rinses with distilled water at least twice per day using the instructions on the package. 1/2 hour after using the [facility] Med rinse, use Nasacort two puffs in each nostril once per day. Use chlortrimeton and an over the counter decongestant (pseudophed or phenylephrine) as needed for the cough.   Exercise regularly  We will order a Cardiopulmonary exercise stress test  We will order a CT chest for one year to follow the mild pulmonary fibrosis  We will see you back in 3 months or sooner if needed

## 2013-02-25 NOTE — Progress Notes (Signed)
Subjective:    Patient ID: Stephen Lawson, male    DOB: [birth year], 63 y.o.   MRN: [id number]  Synopsis: 63 y/o male with very minimal tobaco abuse as a teenager and asbestos exposure came to [facility] in January 2015 for evaluation of dyspnea after having a normal echocardiogram and nuclear stress test.  CT chest showed very minimal fibrosis in the bases (nonspecific) and no evidence of pleural thickening or calcified plaques.    HPI  02/25/2013 ROV > [name] says that he feels like his breathing hasn't gotten any better since the last visit.  Even at rest he feels somewhat labored.  He is coughing a little and occasionally brings up clear to yellow sputum. He has sinus congestion and mucus.  He has had some leg swelling and tingling in his feet too.  He was given an increased dose of lasix which helped.   Past Medical History  Diagnosis Date  . Hypertension   . Back pain      Review of Systems  Constitutional: Negative for fever, chills and fatigue.  HENT: Positive for nosebleeds, postnasal drip and rhinorrhea.   Respiratory: Positive for cough and shortness of breath. Negative for wheezing.   Cardiovascular: Negative for chest pain, palpitations and leg swelling.       Objective:   Physical Exam  Filed Vitals:   02/25/13 0902  BP: 134/78  Pulse: 60  Temp: 98.2 F (36.8 C)  TempSrc: Oral  Height: 5\' 11"  (1.803 m)  Weight: 252 lb 3.2 oz (114.397 kg)  SpO2: 98%  RA  Gen: well appearing, no acute distress HEENT: NCAT, EOMi, OP clear,  PULM: CTA B CV: RRR, no mgr, no JVD AB: BS+, soft, nontender, no hsm Ext: warm, no edema, no clubbing, no cyanosis     Assessment & Plan:   Shortness of breath There was no evidence of significant pulmonary fibrosis on the CT chest.  Surprisingly, there was no evidence of asbestos exposure either, making the likelihood of asbestosis very very low. Further, his PFTs were nearly completely normal.  Combined with his normal exam and  oximetry, I am not coming up with a clear cause of dyspnea other than deconditioning and obesity.  However, because he feels the symptoms are worsening, we will order a cardiopulmonary stress test to try to figure out what is going on.  Plan: -CPET test -encouraged regular exercise -repeat CT chest in one year  Allergic rhinitis Encouraged nasal steroid and saline  Dysphagia, unspecified(787.20) Minimal "flash" laryngeal penetration on barium swallow, but normal movement.  Perhaps this is the explanation of the mild interstitial changes on CT chest.     Updated Medication List Outpatient Encounter Prescriptions as of 02/25/2013  Medication Sig  . albuterol (PROVENTIL HFA;VENTOLIN HFA) 108 (90 BASE) MCG/ACT inhaler Inhale 2 puffs into the lungs every 6 (six) hours as needed for wheezing or shortness of breath.  Marland Kitchen atenolol (TENORMIN) 100 MG tablet Take 100 mg by mouth 2 (two) times daily.  . benzonatate (TESSALON) 100 MG capsule Take 1 capsule (100 mg total) by mouth 2 (two) times daily as needed for cough.  . furosemide (LASIX) 40 MG tablet Take 1 tablet (40 mg total) by mouth daily.  . furosemide (LASIX) 40 MG tablet TAKE 1 TABLET BY MOUTH DAILY FOR 3 DAYS AS NEEDED FOR SWELLING  . methadone (DOLOPHINE) 10 MG tablet Take 10 mg by mouth 2 (two) times daily.  Marland Kitchen oxycodone (OXY-IR) 5 MG capsule Take 5  mg by mouth daily.  . potassium chloride SA (K-DUR,KLOR-CON) 20 MEQ tablet Take 1 tablet (20 mEq total) by mouth daily.  . SUMAtriptan (IMITREX) 100 MG tablet Take 1 tablet (100 mg total) by mouth every 2 (two) hours as needed for migraine. May repeat in 2 hours if headache persists or recurs.  . [DISCONTINUED] amoxicillin (AMOXIL) 875 MG tablet Take 1 tablet (875 mg total) by mouth 2 (two) times daily.

## 2013-03-06 ENCOUNTER — Ambulatory Visit (HOSPITAL_COMMUNITY): Payer: 59 | Attending: Pulmonary Disease

## 2013-03-06 DIAGNOSIS — R0609 Other forms of dyspnea: Secondary | ICD-10-CM | POA: Insufficient documentation

## 2013-03-06 DIAGNOSIS — R0600 Dyspnea, unspecified: Secondary | ICD-10-CM

## 2013-03-06 DIAGNOSIS — R0989 Other specified symptoms and signs involving the circulatory and respiratory systems: Principal | ICD-10-CM | POA: Insufficient documentation

## 2013-03-11 ENCOUNTER — Telehealth: Payer: Self-pay | Admitting: Family Medicine

## 2013-03-12 ENCOUNTER — Other Ambulatory Visit: Payer: Self-pay | Admitting: Family Medicine

## 2013-03-12 MED ORDER — ATENOLOL 100 MG PO TABS: 100.0000 mg | ORAL_TABLET | Freq: Two times a day (BID) | ORAL | Status: DC

## 2013-03-12 NOTE — Telephone Encounter (Signed)
Atenolol 100mg  po bid #[id number]/[id number] sent to pharm

## 2013-03-28 ENCOUNTER — Telehealth: Payer: Self-pay | Admitting: Pulmonary Disease

## 2013-03-28 NOTE — Telephone Encounter (Signed)
Pt had CPST on 3.4.15 - results are still preliminary in the chart  Called spoke with patient who is requesting an ov to discuss results.  Advised pt that BQ is not in the office for another month (April 28).  Pt okay with this and requests his results be release to him via mychart once available.  Pt did decline ov in [place] as he does not live near that area.  Dr Lake Bells please advise, thank you.

## 2013-03-28 NOTE — Telephone Encounter (Signed)
MyChart Message Copied Below:    Message    Appointment Request From: [name]      With Provider: Simonne Maffucci, MD [facility]]      Preferred Date Range: Any date 03/25/2013 or later      Preferred Times: Any      Reason for visit: Office Visit      Comments:   After having my stress/breathing test at [facility], will someone contact me to schedule a office visit.

## 2013-03-29 NOTE — Telephone Encounter (Signed)
Please let him know that I have seen the results and his lungs were working fine during the study.  These results are not final, and I will let him know when I have seen the final results.

## 2013-03-29 NOTE — Telephone Encounter (Signed)
LMTCB at [phone] as [phone] line busy x 3. Called [phone]-reached patient; he is aware of the prelim results via BQ and will await a call from BQ once final results are completed.

## 2013-03-31 DIAGNOSIS — R0602 Shortness of breath: Secondary | ICD-10-CM

## 2013-04-02 ENCOUNTER — Telehealth: Payer: Self-pay

## 2013-04-02 NOTE — Telephone Encounter (Signed)
Pt aware of results and recs.  Nothing further needed at this time. 

## 2013-04-02 NOTE — Telephone Encounter (Signed)
Message copied by Len Blalock on Tue Apr 02, 2013  2:50 PM ------      Message from: Juanito Doom      Created: Tue Apr 02, 2013  2:06 PM       A,            Please let him know that the final result of the exercise test said that the did not have a lung problem causing the dyspnea.  They think that the metoprolol may keep his heart rate too low so this contributes to his dyspnea.  He needs to discuss this with the physician who prescribes the metoprolol.            Thanks      B ------

## 2013-05-08 ENCOUNTER — Other Ambulatory Visit: Payer: Self-pay | Admitting: Family Medicine

## 2013-07-06 ENCOUNTER — Other Ambulatory Visit: Payer: Self-pay | Admitting: Family Medicine

## 2013-07-09 ENCOUNTER — Other Ambulatory Visit: Payer: Self-pay | Admitting: *Deleted

## 2013-07-09 ENCOUNTER — Other Ambulatory Visit: Payer: Self-pay | Admitting: Family Medicine

## 2013-07-09 MED ORDER — FUROSEMIDE 40 MG PO TABS: 40.0000 mg | ORAL_TABLET | Freq: Every day | ORAL | Status: DC

## 2013-07-09 NOTE — Telephone Encounter (Signed)
Last ov 10/14. Please print for mail order and route to pool.

## 2013-09-19 ENCOUNTER — Ambulatory Visit (INDEPENDENT_AMBULATORY_CARE_PROVIDER_SITE_OTHER): Payer: 59 | Admitting: Family Medicine

## 2013-09-19 ENCOUNTER — Encounter: Payer: Self-pay | Admitting: Family Medicine

## 2013-09-19 VITALS — BP 115/74 | HR 58 | Temp 98.4°F | Ht 71.0 in | Wt 257.8 lb

## 2013-09-19 DIAGNOSIS — L03031 Cellulitis of right toe: Secondary | ICD-10-CM

## 2013-09-19 DIAGNOSIS — R609 Edema, unspecified: Secondary | ICD-10-CM

## 2013-09-19 DIAGNOSIS — R0602 Shortness of breath: Secondary | ICD-10-CM

## 2013-09-19 DIAGNOSIS — E1165 Type 2 diabetes mellitus with hyperglycemia: Secondary | ICD-10-CM

## 2013-09-19 DIAGNOSIS — R7309 Other abnormal glucose: Secondary | ICD-10-CM

## 2013-09-19 DIAGNOSIS — E785 Hyperlipidemia, unspecified: Secondary | ICD-10-CM

## 2013-09-19 DIAGNOSIS — E118 Type 2 diabetes mellitus with unspecified complications: Secondary | ICD-10-CM

## 2013-09-19 DIAGNOSIS — Z1211 Encounter for screening for malignant neoplasm of colon: Secondary | ICD-10-CM

## 2013-09-19 DIAGNOSIS — IMO0002 Reserved for concepts with insufficient information to code with codable children: Secondary | ICD-10-CM

## 2013-09-19 DIAGNOSIS — R739 Hyperglycemia, unspecified: Secondary | ICD-10-CM

## 2013-09-19 DIAGNOSIS — L03039 Cellulitis of unspecified toe: Secondary | ICD-10-CM

## 2013-09-19 DIAGNOSIS — L02619 Cutaneous abscess of unspecified foot: Secondary | ICD-10-CM

## 2013-09-19 DIAGNOSIS — R5381 Other malaise: Secondary | ICD-10-CM

## 2013-09-19 DIAGNOSIS — I10 Essential (primary) hypertension: Secondary | ICD-10-CM

## 2013-09-19 DIAGNOSIS — R5383 Other fatigue: Principal | ICD-10-CM

## 2013-09-19 LAB — POCT CBC
Granulocyte percent: 73.1 %G (ref 37–80)
HCT, POC: 42.3 % — AB (ref 43.5–53.7)
Hemoglobin: 13.8 g/dL — AB (ref 14.1–18.1)
Lymph, poc: 1.8 (ref 0.6–3.4)
MCH, POC: 27.4 pg (ref 27–31.2)
MCHC: 32.6 g/dL (ref 31.8–35.4)
MCV: 83.9 fL (ref 80–97)
MPV: 7.6 fL (ref 0–99.8)
POC Granulocyte: 5.3 (ref 2–6.9)
POC LYMPH PERCENT: 24.4 %L (ref 10–50)
Platelet Count, POC: 237 10*3/uL (ref 142–424)
RBC: 5 M/uL (ref 4.69–6.13)
RDW, POC: 13.7 %
WBC: 7.2 10*3/uL (ref 4.6–10.2)

## 2013-09-19 LAB — POCT GLYCOSYLATED HEMOGLOBIN (HGB A1C): Hemoglobin A1C: 6.9

## 2013-09-19 MED ORDER — DOXYCYCLINE HYCLATE 100 MG PO TABS: 100.0000 mg | ORAL_TABLET | Freq: Two times a day (BID) | ORAL | Status: DC

## 2013-09-19 MED ORDER — FUROSEMIDE 40 MG PO TABS: 40.0000 mg | ORAL_TABLET | Freq: Two times a day (BID) | ORAL | Status: DC

## 2013-09-19 MED ORDER — METFORMIN HCL 500 MG PO TABS: 500.0000 mg | ORAL_TABLET | Freq: Two times a day (BID) | ORAL | Status: DC

## 2013-09-19 MED ORDER — CLOBETASOL PROPIONATE 0.05 % EX CREA: 1.0000 "application " | TOPICAL_CREAM | Freq: Two times a day (BID) | CUTANEOUS | Status: DC

## 2013-09-19 NOTE — Progress Notes (Signed)
   Subjective:    Patient ID: Stephen Lawson, male    DOB: January 26, 1950, 63 y.o.   MRN: [id number]  HPI  This 63 y.o. male presents for evaluation of follow up.  He is having a right first toe sore that is painful.  He has had elevated glucose values on labs and no hgbaic. He is doing better with his SOB.  He did see a pulmonologist who did a CPFT and stress test on patient and both were normal showing normal heart and lung function.   He has DDD and poor mobility. He has hx of OA and he does not get around very well.  He is taking methadone for chronic back pain.  He has been having to take $Remov'40mg'gAsoct$  lasix bid to keep swelling down.  He has had problems with hypokalemia. He has family hx of colon cancer.  He is due to follow up with GI.  Pulmonary consult mentioned etiology of SOB as deconditioning.  Recommendations to change him to another bp med since he is on beta blocker and this can cause SOB.  Patient states his legs are doing better and he is not SOB today.  He does c/o fatigue.  Review of Systems C/o back pain, swelling, right toe ulcer, fatigue and edema   No chest pain, SOB, HA, dizziness, vision change, N/V, diarrhea, constipation, dysuria, urinary urgency or frequency, or rash.  Objective:   Physical Exam  Vital signs noted  Well developed well nourished male.  HEENT - Head atraumatic Normocephalic                Eyes - PERRLA, Conjuctiva - clear Sclera- Clear EOMI                Ears - EAC's Wnl TM's Wnl Gross Hearing WN                Throat - oropharanx wnl Respiratory - Lungs CTA bilateral Cardiac - RRR S1 and S2 without murmur GI - Abdomen soft Nontender and bowel sounds active x 4 Extremities - one plus edema in lower extremities Neuro - Grossly intact.      Assessment & Plan:  Other malaise and fatigue - Plan: POCT CBC, CMP14+EGFR, TSH, Vit D  25 hydroxy (rtn osteoporosis monitoring), Vitamin B12, Testosterone,Free and Total  Edema - Plan: furosemide (LASIX) 40 MG tablet,  Ambulatory referral to Vascular Surgery.  Wear venous compression stockings.  SOB (shortness of breath) - Doing better and no c/o SOB today.  Will decrease tenormin to qd  Hyperglycemia - Plan: POCT glycosylated hemoglobin (Hb A1C)  Special screening for malignant neoplasms, colon - Plan: CANCELED: Ambulatory referral to Gastroenterology  Other and unspecified hyperlipidemia - Plan: Lipid panel  Cellulitis of toe of right foot - Plan: doxycycline (VIBRA-TABS) 100 MG tablet, DISCONTINUED: doxycycline (VIBRA-TABS) 100 MG tablet  Physical deconditioning - Plan: May be reason for SOB and discussed needing to stay active.  Unspecified essential hypertension - Plan: furosemide (LASIX) 40 MG tablet decrease to qd and then decrease tenormin $RemoveBeforeDEI'100mg'WHiVhRBnOCSjJXAj$  po qd  Type II or unspecified type diabetes mellitus with unspecified complication, uncontrolled - Plan: metFORMIN (GLUCOPHAGE) 500 MG tablet  Follow up in 2 weeks for bp recheck  Lysbeth Penner FNP

## 2013-09-20 ENCOUNTER — Encounter: Payer: Self-pay | Admitting: Family Medicine

## 2013-09-21 LAB — LIPID PANEL
Chol/HDL Ratio: 3.9 ratio units (ref 0.0–5.0)
Cholesterol, Total: 129 mg/dL (ref 100–199)
HDL: 33 mg/dL — ABNORMAL LOW (ref >39)
LDL Calculated: 78 mg/dL (ref 0–99)
Triglycerides: 89 mg/dL (ref 0–149)
VLDL Cholesterol Cal: 18 mg/dL (ref 5–40)

## 2013-09-21 LAB — CMP14+EGFR
ALT: 22 IU/L (ref 0–44)
AST: 19 IU/L (ref 0–40)
Albumin/Globulin Ratio: 1.8 (ref 1.1–2.5)
Albumin: 4.2 g/dL (ref 3.6–4.8)
Alkaline Phosphatase: 104 IU/L (ref 39–117)
BUN/Creatinine Ratio: 10 (ref 10–22)
BUN: 11 mg/dL (ref 8–27)
CO2: 29 mmol/L (ref 18–29)
Calcium: 9.4 mg/dL (ref 8.6–10.2)
Chloride: 101 mmol/L (ref 97–108)
Creatinine, Ser: 1.07 mg/dL (ref 0.76–1.27)
GFR calc Af Amer: 86 mL/min/{1.73_m2} (ref >59)
GFR calc non Af Amer: 74 mL/min/{1.73_m2} (ref >59)
Globulin, Total: 2.4 g/dL (ref 1.5–4.5)
Glucose: 134 mg/dL — ABNORMAL HIGH (ref 65–99)
Potassium: 4.8 mmol/L (ref 3.5–5.2)
Sodium: 142 mmol/L (ref 134–144)
Total Bilirubin: 0.4 mg/dL (ref 0.0–1.2)
Total Protein: 6.6 g/dL (ref 6.0–8.5)

## 2013-09-21 LAB — VITAMIN D 25 HYDROXY (VIT D DEFICIENCY, FRACTURES): Vit D, 25-Hydroxy: 30.8 ng/mL (ref 30.0–100.0)

## 2013-09-21 LAB — TESTOSTERONE,FREE AND TOTAL
Testosterone, Free: 2.6 pg/mL — ABNORMAL LOW (ref 6.6–18.1)
Testosterone: 115 ng/dL — ABNORMAL LOW (ref 348–1197)

## 2013-09-21 LAB — TSH: TSH: 6.19 u[IU]/mL — ABNORMAL HIGH (ref 0.450–4.500)

## 2013-09-21 LAB — VITAMIN B12: Vitamin B-12: 479 pg/mL (ref 211–946)

## 2013-09-23 ENCOUNTER — Other Ambulatory Visit: Payer: Self-pay | Admitting: Family Medicine

## 2013-09-23 ENCOUNTER — Telehealth: Payer: Self-pay | Admitting: Family Medicine

## 2013-09-23 MED ORDER — LEVOTHYROXINE SODIUM 25 MCG PO TABS: 25.0000 ug | ORAL_TABLET | Freq: Every day | ORAL | Status: DC

## 2013-09-23 NOTE — Telephone Encounter (Signed)
Discussed results and medications with patient. He prefers to try diet and exercise to manage his elev blood sugar and will f/u in 3 months.

## 2013-10-02 ENCOUNTER — Encounter: Payer: Self-pay | Admitting: Family Medicine

## 2013-10-02 ENCOUNTER — Ambulatory Visit (INDEPENDENT_AMBULATORY_CARE_PROVIDER_SITE_OTHER): Payer: 59 | Admitting: Family Medicine

## 2013-10-02 VITALS — BP 107/52 | HR 57 | Temp 99.2°F | Ht 71.0 in | Wt 252.0 lb

## 2013-10-02 DIAGNOSIS — E291 Testicular hypofunction: Secondary | ICD-10-CM

## 2013-10-02 MED ORDER — TESTOSTERONE 20.25 MG/1.25GM (1.62%) TD GEL: TRANSDERMAL | Status: DC

## 2013-10-02 NOTE — Progress Notes (Signed)
   Subjective:    Patient ID: JERRIK HOUSHOLDER, male    DOB: [birth year], 63 y.o.   MRN: [id number]  HPI This 63 y.o. male presents for evaluation of follow up on right toe cellulitis and fatigue.  Labs showed hypothyroidism and hypogonadism.  He states the right first toe is healed up.   Review of Systems C/o fatigue.   No chest pain, SOB, HA, dizziness, vision change, N/V, diarrhea, constipation, dysuria, urinary urgency or frequency, myalgias, arthralgias or rash.  Objective:   Physical Exam  Vital signs noted  Well developed well nourished male.  HEENT - Head atraumatic Normocephalic                Eyes - PERRLA, Conjuctiva - clear Sclera- Clear EOMI                Ears - EAC's Wnl TM's Wnl Gross Hearing WNL                Nose - Nares patent                 Throat - oropharanx wnl Respiratory - Lungs CTA bilateral Cardiac - RRR S1 and S2 without murmur GI - Abdomen soft Nontender and bowel sounds active x 4 Extremities - Right first toe cellulitis resolved      Assessment & Plan:  Hypogonadism in male - Plan: Testosterone (ANDROGEL) 20.25 MG/1.25GM (1.62%) GEL  Cellulitis - Resolved  Lysbeth Penner FNP

## 2013-10-03 ENCOUNTER — Other Ambulatory Visit: Payer: Self-pay | Admitting: Family Medicine

## 2013-10-03 ENCOUNTER — Telehealth: Payer: Self-pay | Admitting: *Deleted

## 2013-10-03 DIAGNOSIS — E291 Testicular hypofunction: Secondary | ICD-10-CM

## 2013-10-03 MED ORDER — TESTOSTERONE CYPIONATE 200 MG/ML IM SOLN: 200.0000 mg | INTRAMUSCULAR | Status: DC

## 2013-10-03 NOTE — Telephone Encounter (Signed)
Changed rx to testosterone injections bi weekly and can come and pick up rx.   Cannot get any other med pre authorized, other option is to refer to endocrinology If do not want injections

## 2013-10-03 NOTE — Telephone Encounter (Signed)
Received [facility] from [facility] stating that Androgel is not covered by insurance and wants to know if it can be changed to something else. Please advise

## 2013-10-03 NOTE — Telephone Encounter (Signed)
Patient aware said daughter would give him the injections

## 2013-10-21 ENCOUNTER — Other Ambulatory Visit: Payer: Self-pay | Admitting: Family Medicine

## 2013-10-25 ENCOUNTER — Encounter (HOSPITAL_COMMUNITY): Payer: 59

## 2013-10-25 ENCOUNTER — Encounter: Payer: 59 | Admitting: Vascular Surgery

## 2013-11-20 ENCOUNTER — Other Ambulatory Visit: Payer: Self-pay

## 2013-11-20 MED ORDER — LEVOTHYROXINE SODIUM 25 MCG PO TABS: 25.0000 ug | ORAL_TABLET | Freq: Every day | ORAL | Status: DC

## 2013-12-02 ENCOUNTER — Encounter: Payer: Self-pay | Admitting: Family Medicine

## 2013-12-02 ENCOUNTER — Ambulatory Visit (INDEPENDENT_AMBULATORY_CARE_PROVIDER_SITE_OTHER): Payer: 59 | Admitting: Family Medicine

## 2013-12-02 VITALS — BP 123/68 | HR 57 | Temp 97.2°F | Ht 71.0 in | Wt 254.6 lb

## 2013-12-02 DIAGNOSIS — E785 Hyperlipidemia, unspecified: Secondary | ICD-10-CM

## 2013-12-02 DIAGNOSIS — R5383 Other fatigue: Secondary | ICD-10-CM

## 2013-12-02 DIAGNOSIS — I10 Essential (primary) hypertension: Secondary | ICD-10-CM

## 2013-12-02 DIAGNOSIS — E038 Other specified hypothyroidism: Secondary | ICD-10-CM

## 2013-12-02 DIAGNOSIS — E291 Testicular hypofunction: Secondary | ICD-10-CM

## 2013-12-02 DIAGNOSIS — E119 Type 2 diabetes mellitus without complications: Secondary | ICD-10-CM

## 2013-12-02 LAB — POCT CBC
Granulocyte percent: 67.1 %G (ref 37–80)
HCT, POC: 42.3 % — AB (ref 43.5–53.7)
Hemoglobin: 13.8 g/dL — AB (ref 14.1–18.1)
Lymph, poc: 1.9 (ref 0.6–3.4)
MCH, POC: 26.9 pg — AB (ref 27–31.2)
MCHC: 32.6 g/dL (ref 31.8–35.4)
MCV: 82.4 fL (ref 80–97)
MPV: 7.6 fL (ref 0–99.8)
POC Granulocyte: 4.4 (ref 2–6.9)
POC LYMPH PERCENT: 29.1 %L (ref 10–50)
Platelet Count, POC: 240 10*3/uL (ref 142–424)
RBC: 5.1 M/uL (ref 4.69–6.13)
RDW, POC: 14.2 %
WBC: 6.5 10*3/uL (ref 4.6–10.2)

## 2013-12-02 LAB — POCT GLYCOSYLATED HEMOGLOBIN (HGB A1C): Hemoglobin A1C: 6.5

## 2013-12-02 NOTE — Progress Notes (Signed)
Subjective:    Patient ID: Stephen Lawson, male    DOB: Aug 07, 1950, 63 y.o.   MRN: [id number]  HPI Patient is here for routine follow up.  He has hx of T2DM, hypogonadism, hypothyroidism, and DDD of the LS spine.  He states his last colonoscopy was normal 3 years ago.  He has hx of polyposis. He is having fatigue.  He chose not to do testosterone replacement.  Review of Systems  Constitutional: Negative for fever.  HENT: Negative for ear pain.   Eyes: Negative for discharge.  Respiratory: Negative for cough.   Cardiovascular: Negative for chest pain.  Gastrointestinal: Negative for abdominal distention.  Endocrine: Negative for polyuria.  Genitourinary: Negative for difficulty urinating.  Musculoskeletal: Negative for gait problem and neck pain.  Skin: Negative for color change and rash.  Neurological: Negative for speech difficulty and headaches.  Psychiatric/Behavioral: Negative for agitation.       Objective:    BP 123/68 mmHg  Pulse 57  Temp(Src) 97.2 F (36.2 C) (Oral)  Ht 5' 11" (1.803 m)  Wt 254 lb 9.6 oz (115.486 kg)  BMI 35.53 kg/m2 Physical Exam  Constitutional: He is oriented to person, place, and time. He appears well-developed and well-nourished.  HENT:  Head: Normocephalic and atraumatic.  Mouth/Throat: Oropharynx is clear and moist.  Eyes: Pupils are equal, round, and reactive to light.  Neck: Normal range of motion. Neck supple.  Cardiovascular: Normal rate and regular rhythm.   No murmur heard. Pulmonary/Chest: Effort normal and breath sounds normal.  Abdominal: Soft. Bowel sounds are normal. There is no tenderness.  Neurological: He is alert and oriented to person, place, and time.  Skin: Skin is warm and dry.  Psychiatric: He has a normal mood and affect.          Assessment & Plan:     ICD-9-CM ICD-10-CM   1. Essential hypertension, benign 401.1 I10   2. Other specified hypothyroidism 244.8 E03.8 Thyroid Panel With TSH  3. Hyperlipemia  272.4 E78.5 Lipid panel  4. Diabetes mellitus without complication 537.94 F27.6 POCT glycosylated hemoglobin (Hb A1C)     CMP14+EGFR  5. Other fatigue 780.79 R53.83 POCT CBC     Thyroid Panel With TSH     No Follow-up on file.  Lysbeth Penner FNP

## 2013-12-04 ENCOUNTER — Other Ambulatory Visit: Payer: Self-pay | Admitting: Family Medicine

## 2013-12-04 LAB — LIPID PANEL
Chol/HDL Ratio: 3.7 ratio units (ref 0.0–5.0)
Cholesterol, Total: 136 mg/dL (ref 100–199)
HDL: 37 mg/dL — ABNORMAL LOW (ref >39)
LDL Calculated: 79 mg/dL (ref 0–99)
Triglycerides: 100 mg/dL (ref 0–149)
VLDL Cholesterol Cal: 20 mg/dL (ref 5–40)

## 2013-12-04 LAB — THYROID PANEL WITH TSH
Free Thyroxine Index: 1.8 (ref 1.2–4.9)
T3 Uptake Ratio: 27 % (ref 24–39)
T4, Total: 6.6 ug/dL (ref 4.5–12.0)
TSH: 6.36 u[IU]/mL — ABNORMAL HIGH (ref 0.450–4.500)

## 2013-12-04 LAB — TESTOSTERONE,FREE AND TOTAL
Testosterone, Free: 2 pg/mL — ABNORMAL LOW (ref 6.6–18.1)
Testosterone: 116 ng/dL — ABNORMAL LOW (ref 348–1197)

## 2013-12-04 LAB — CMP14+EGFR
ALT: 25 IU/L (ref 0–44)
AST: 22 IU/L (ref 0–40)
Albumin/Globulin Ratio: 1.8 (ref 1.1–2.5)
Albumin: 4.4 g/dL (ref 3.6–4.8)
Alkaline Phosphatase: 99 IU/L (ref 39–117)
BUN/Creatinine Ratio: 13 (ref 10–22)
BUN: 14 mg/dL (ref 8–27)
CO2: 30 mmol/L — ABNORMAL HIGH (ref 18–29)
Calcium: 9.4 mg/dL (ref 8.6–10.2)
Chloride: 99 mmol/L (ref 97–108)
Creatinine, Ser: 1.08 mg/dL (ref 0.76–1.27)
GFR calc Af Amer: 85 mL/min/{1.73_m2} (ref >59)
GFR calc non Af Amer: 73 mL/min/{1.73_m2} (ref >59)
Globulin, Total: 2.4 g/dL (ref 1.5–4.5)
Glucose: 111 mg/dL — ABNORMAL HIGH (ref 65–99)
Potassium: 4.9 mmol/L (ref 3.5–5.2)
Sodium: 142 mmol/L (ref 134–144)
Total Bilirubin: 0.3 mg/dL (ref 0.0–1.2)
Total Protein: 6.8 g/dL (ref 6.0–8.5)

## 2013-12-04 MED ORDER — LEVOTHYROXINE SODIUM 50 MCG PO TABS: 50.0000 ug | ORAL_TABLET | Freq: Every day | ORAL | Status: DC

## 2013-12-05 ENCOUNTER — Telehealth: Payer: Self-pay

## 2013-12-05 NOTE — Telephone Encounter (Signed)
Pt aware of results and will RTC in 4 weeks for labs

## 2013-12-05 NOTE — Telephone Encounter (Signed)
-----   Message from Lysbeth Penner, FNP sent at 12/04/2013  2:24 PM EST ----- Thyroid is still low and levothyroxine increased to 50 mcg po qd and repeat TSH in 4 weeks.  Testosterone is low and consider replacement.

## 2013-12-08 ENCOUNTER — Other Ambulatory Visit: Payer: Self-pay | Admitting: Family Medicine

## 2013-12-09 ENCOUNTER — Other Ambulatory Visit: Payer: Self-pay | Admitting: Interventional Cardiology

## 2013-12-09 NOTE — Telephone Encounter (Signed)
Last seen 12/02/13  B [place] 

## 2014-01-30 ENCOUNTER — Other Ambulatory Visit: Payer: 59

## 2014-02-14 ENCOUNTER — Encounter: Payer: Self-pay | Admitting: Family Medicine

## 2014-02-14 ENCOUNTER — Ambulatory Visit (INDEPENDENT_AMBULATORY_CARE_PROVIDER_SITE_OTHER): Payer: 59 | Admitting: Family Medicine

## 2014-02-14 VITALS — BP 145/75 | HR 68 | Temp 97.2°F | Ht 71.0 in | Wt 251.2 lb

## 2014-02-14 DIAGNOSIS — R609 Edema, unspecified: Secondary | ICD-10-CM

## 2014-02-14 DIAGNOSIS — E038 Other specified hypothyroidism: Secondary | ICD-10-CM

## 2014-02-14 DIAGNOSIS — I10 Essential (primary) hypertension: Secondary | ICD-10-CM

## 2014-02-14 DIAGNOSIS — J0111 Acute recurrent frontal sinusitis: Secondary | ICD-10-CM

## 2014-02-14 MED ORDER — FUROSEMIDE 40 MG PO TABS: 20.0000 mg | ORAL_TABLET | Freq: Every day | ORAL | Status: DC

## 2014-02-14 MED ORDER — LEVOFLOXACIN 500 MG PO TABS: 500.0000 mg | ORAL_TABLET | Freq: Every day | ORAL | Status: DC

## 2014-02-14 NOTE — Progress Notes (Signed)
Subjective:  Patient ID: Stephen Lawson, male    DOB: January 09, 1950  Age: 64 y.o. MRN: [id number]  CC: Diabetes; Hyperlipidemia; and Hypothyroidism   HPI [name] presents forPatient in for follow-up of hypertension. Patient has no history of  chest pain or shortness of breath or recent cough. Patient also denies symptoms of TIA such as numbness weakness lateralizing. Patient checks  blood pressure at home and has not had any elevated readings recently. Patient denies side effects from his medication. States taking it regularly.  Sinus Pain: Patient complains of congestion, facial pain, headache described as frontal, moderate pressure, nasal congestion, no  fever a Symptoms include congestion, facial pain, nasal congestion, no  fever, non productive cough, post nasal drip and sinus pressure with no fever, chills, night sweats or weight loss. Onset of symptoms was 2 1/2 weeks ago, gradually worsening since that time. Hhe is drinking moderate amounts of fluids.  Past history is significant for sinus infections every year or so.  Patient presents for follow-up on  thyroid. She has a history of hypothyroidism for many years. It has been stable recently. Pt. denies any change in  voice, loss of hair, heat or cold intolerance. Energy level has been adequate to good. She denies constipation and diarrhea. No myxedema. Medication is as noted below. Verified that pt is taking it daily on an empty stomach. Well tolerated.  Patient does not check blood sugar at home Patient denies symptoms such as polyuria, polydipsia, excessive hunger, nausea No significant hypoglycemic spells noted. Medications as noted below. Taking them regularly without complication/adverse reaction being reported today.   Follow-up on chronic pain History Leroi has a past medical history of Hypertension and Back pain.   He has past surgical history that includes Back surgery; Elbow surgery; Shoulder surgery; and Wrist surgery.    His family history includes Diabetes in his brother; Heart disease in his father and mother.He reports that he quit smoking about 45 years ago. His smoking use included Cigarettes. He started smoking about 47 years ago. He smoked 0.50 packs per day. He uses smokeless tobacco. He reports that he does not drink alcohol or use illicit drugs.  Current Outpatient Prescriptions on File Prior to Visit  Medication Sig Dispense Refill  . atenolol (TENORMIN) 100 MG tablet Take 1 tablet (100 mg total) by mouth 2 (two) times daily. 180 tablet 3  . clobetasol cream (TEMOVATE) 8.33 % Apply 1 application topically 2 (two) times daily. 30 g 3  . diazepam (VALIUM) 5 MG tablet 5 mg every 12 (twelve) hours as needed.   1  . KLOR-CON M20 20 MEQ tablet TAKE 1 TABLET DAILY 90 tablet 0  . levothyroxine (SYNTHROID, LEVOTHROID) 50 MCG tablet Take 1 tablet (50 mcg total) by mouth daily. 90 tablet 3  . metFORMIN (GLUCOPHAGE) 500 MG tablet Take 1 tablet (500 mg total) by mouth 2 (two) times daily with a meal. 180 tablet 3  . methadone (DOLOPHINE) 10 MG tablet Take 10 mg by mouth 2 (two) times daily.    . SUMAtriptan (IMITREX) 100 MG tablet TAKE 1 TABLET EVERY 2 HOURS AS NEEDED FOR MIGRAINE-MAY REPEAT IN 2 HOURS IF HEADACHE PERSISTS 15 tablet 2   Current Facility-Administered Medications on File Prior to Visit  Medication Dose Route Frequency Provider Last Rate Last Dose  . levalbuterol (XOPENEX) nebulizer solution 1.25 mg  1.25 mg Nebulization Once Lysbeth Penner, FNP      . levalbuterol Penne Lash) nebulizer solution 1.25 mg  1.25 mg Nebulization Once Lysbeth Penner, FNP        ROS Review of Systems  Constitutional: Negative for fever, chills, diaphoresis and unexpected weight change.  HENT: Negative for congestion, hearing loss, rhinorrhea, sore throat and trouble swallowing.   Respiratory: Negative for cough, chest tightness, shortness of breath and wheezing.   Gastrointestinal: Negative for nausea, vomiting,  abdominal pain, diarrhea, constipation and abdominal distention.  Endocrine: Negative for cold intolerance and heat intolerance.  Genitourinary: Negative for dysuria, hematuria and flank pain.  Musculoskeletal: Negative for joint swelling and arthralgias.  Skin: Negative for rash.  Neurological: Negative for dizziness and headaches.  Psychiatric/Behavioral: Negative for dysphoric mood, decreased concentration and agitation. The patient is not nervous/anxious.     Objective:  BP 145/75 mmHg  Pulse 68  Temp(Src) 97.2 F (36.2 C) (Oral)  Ht _0  (1.803 m)  Wt 251 lb 3.2 oz (113.944 kg)  BMI 35.05 kg/m2  BP Readings from Last 3 Encounters:  02/14/14 145/75  12/02/13 123/68  10/02/13 107/52    Wt Readings from Last 3 Encounters:  02/14/14 251 lb 3.2 oz (113.944 kg)  12/02/13 254 lb 9.6 oz (115.486 kg)  10/02/13 252 lb (114.306 kg)     Physical Exam  Constitutional: He is oriented to person, place, and time. He appears well-developed and well-nourished. No distress.  HENT:  Head: Normocephalic and atraumatic.  Right Ear: External ear normal.  Left Ear: External ear normal.  Nose: Nose normal.  Mouth/Throat: Oropharynx is clear and moist.  Eyes: Conjunctivae and EOM are normal. Pupils are equal, round, and reactive to light.  Neck: Normal range of motion. Neck supple. No thyromegaly present.  Cardiovascular: Normal rate, regular rhythm and normal heart sounds.   No murmur heard. Pulmonary/Chest: Effort normal and breath sounds normal. No respiratory distress. He has no wheezes. He has no rales.  Abdominal: Soft. Bowel sounds are normal. He exhibits no distension. There is no tenderness.  Lymphadenopathy:    He has no cervical adenopathy.  Neurological: He is alert and oriented to person, place, and time. He has normal reflexes.  Skin: Skin is warm and dry.  Psychiatric: He has a normal mood and affect. His behavior is normal. Judgment and thought content normal.    Lab  Results  Component Value Date   HGBA1C 6.5 12/02/2013   HGBA1C 6.9 09/19/2013    Lab Results  Component Value Date   WBC 6.5 12/02/2013   HGB 13.8* 12/02/2013   HCT 42.3* 12/02/2013   PLT 246 06/08/2010   GLUCOSE 111* 12/02/2013   TRIG 100 12/02/2013   HDL 37* 12/02/2013   LDLCALC 79 12/02/2013   ALT 25 12/02/2013   AST 22 12/02/2013   NA 142 12/02/2013   K 4.9 12/02/2013   CL 99 12/02/2013   CREATININE 1.08 12/02/2013   BUN 14 12/02/2013   CO2 30* 12/02/2013   TSH 6.360* 12/02/2013   PSA 0.3 10/25/2012   HGBA1C 6.5 12/02/2013    No results found.  Assessment & Plan:   Dino was seen today for diabetes, hyperlipidemia and hypothyroidism.  Diagnoses and all orders for this visit:  Other specified hypothyroidism Orders: -     Thyroid Panel With TSH -     CMP14+EGFR  Edema Orders: -     furosemide (LASIX) 40 MG tablet; Take 0.5 tablets (20 mg total) by mouth daily. -     CMP14+EGFR  Essential hypertension Orders: -     furosemide (LASIX) 40 MG  tablet; Take 0.5 tablets (20 mg total) by mouth daily. -     CMP14+EGFR  Acute recurrent frontal sinusitis  Other orders -     levofloxacin (LEVAQUIN) 500 MG tablet; Take 1 tablet (500 mg total) by mouth daily.   I have discontinued Mr. Pienta oxycodone. I have also changed his furosemide. Additionally, I am having him start on levofloxacin. Lastly, I am having him maintain his methadone, atenolol, metFORMIN, clobetasol cream, diazepam, levothyroxine, SUMAtriptan, KLOR-CON M20, and Oxycodone HCl. We will continue to administer levalbuterol and levalbuterol.  Meds ordered this encounter  Medications  . Oxycodone HCl 10 MG TABS    Sig: Take 1 tablet by mouth daily.    Refill:  0  . furosemide (LASIX) 40 MG tablet    Sig: Take 0.5 tablets (20 mg total) by mouth daily.    Dispense:  30 tablet    Refill:  5  . levofloxacin (LEVAQUIN) 500 MG tablet    Sig: Take 1 tablet (500 mg total) by mouth daily.    Dispense:  7  tablet    Refill:  0   Follow-up: Return in about 6 years (around 02/15/2020) for diabetes, hypertension.  Claretta Fraise, M.D.

## 2014-02-15 ENCOUNTER — Other Ambulatory Visit: Payer: Self-pay | Admitting: Nurse Practitioner

## 2014-02-15 ENCOUNTER — Telehealth: Payer: Self-pay | Admitting: Family Medicine

## 2014-02-15 LAB — THYROID PANEL WITH TSH
Free Thyroxine Index: 2.3 (ref 1.2–4.9)
T3 Uptake Ratio: 29 % (ref 24–39)
T4 TOTAL: 7.9 ug/dL (ref 4.5–12.0)
TSH: 3.61 u[IU]/mL (ref 0.450–4.500)

## 2014-02-15 LAB — CMP14+EGFR
A/G RATIO: 1.4 (ref 1.1–2.5)
ALBUMIN: 4.3 g/dL (ref 3.6–4.8)
ALT: 25 IU/L (ref 0–44)
AST: 19 IU/L (ref 0–40)
Alkaline Phosphatase: 107 IU/L (ref 39–117)
BILIRUBIN TOTAL: 0.2 mg/dL (ref 0.0–1.2)
BUN/Creatinine Ratio: 14 (ref 10–22)
BUN: 15 mg/dL (ref 8–27)
CO2: 30 mmol/L — ABNORMAL HIGH (ref 18–29)
Calcium: 9.5 mg/dL (ref 8.6–10.2)
Chloride: 98 mmol/L (ref 97–108)
Creatinine, Ser: 1.08 mg/dL (ref 0.76–1.27)
GFR calc Af Amer: 85 mL/min/{1.73_m2} (ref >59)
GFR calc non Af Amer: 73 mL/min/{1.73_m2} (ref >59)
GLUCOSE: 108 mg/dL — AB (ref 65–99)
Globulin, Total: 3 g/dL (ref 1.5–4.5)
Potassium: 4.3 mmol/L (ref 3.5–5.2)
Sodium: 142 mmol/L (ref 134–144)
Total Protein: 7.3 g/dL (ref 6.0–8.5)

## 2014-02-15 MED ORDER — LEVOFLOXACIN 500 MG PO TABS: 500.0000 mg | ORAL_TABLET | Freq: Every day | ORAL | Status: DC

## 2014-02-17 NOTE — Telephone Encounter (Signed)
[facility] Pt notified

## 2014-02-25 ENCOUNTER — Telehealth: Payer: Self-pay | Admitting: Family Medicine

## 2014-02-25 MED ORDER — SUMATRIPTAN SUCCINATE 100 MG PO TABS: ORAL_TABLET | ORAL | Status: DC

## 2014-02-25 NOTE — Telephone Encounter (Signed)
Six mos. Please., WS

## 2014-02-25 NOTE — Telephone Encounter (Signed)
Would like a years refills on generic Imitrex sent [facility] [place]. Are you ok with refilling it for a year or would you like a different number of refills?

## 2014-02-25 NOTE — Telephone Encounter (Signed)
Patient aware that rx sent to pharmacy. 

## 2014-03-25 ENCOUNTER — Other Ambulatory Visit (INDEPENDENT_AMBULATORY_CARE_PROVIDER_SITE_OTHER): Payer: 59

## 2014-03-25 ENCOUNTER — Ambulatory Visit (INDEPENDENT_AMBULATORY_CARE_PROVIDER_SITE_OTHER): Payer: 59

## 2014-03-25 DIAGNOSIS — R5383 Other fatigue: Secondary | ICD-10-CM

## 2014-03-25 DIAGNOSIS — I10 Essential (primary) hypertension: Secondary | ICD-10-CM | POA: Diagnosis not present

## 2014-03-25 DIAGNOSIS — E119 Type 2 diabetes mellitus without complications: Secondary | ICD-10-CM

## 2014-03-25 DIAGNOSIS — Z23 Encounter for immunization: Secondary | ICD-10-CM

## 2014-03-25 DIAGNOSIS — E785 Hyperlipidemia, unspecified: Secondary | ICD-10-CM

## 2014-03-25 LAB — POCT CBC
GRANULOCYTE PERCENT: 60.4 % (ref 37–80)
HCT, POC: 48.9 % (ref 43.5–53.7)
Hemoglobin: 14.8 g/dL (ref 14.1–18.1)
Lymph, poc: 3 (ref 0.6–3.4)
MCH, POC: 25.6 pg — AB (ref 27–31.2)
MCHC: 30.3 g/dL — AB (ref 31.8–35.4)
MCV: 84.3 fL (ref 80–97)
MPV: 8.3 fL (ref 0–99.8)
PLATELET COUNT, POC: 278 10*3/uL (ref 142–424)
POC GRANULOCYTE: 4.8 (ref 2–6.9)
POC LYMPH %: 37.4 % (ref 10–50)
RBC: 5.8 M/uL (ref 4.69–6.13)
RDW, POC: 15 %
WBC: 8 10*3/uL (ref 4.6–10.2)

## 2014-03-25 LAB — POCT GLYCOSYLATED HEMOGLOBIN (HGB A1C)

## 2014-03-26 LAB — CMP14+EGFR
ALBUMIN: 4.3 g/dL (ref 3.6–4.8)
ALK PHOS: 95 IU/L (ref 39–117)
ALT: 28 IU/L (ref 0–44)
AST: 22 IU/L (ref 0–40)
Albumin/Globulin Ratio: 1.5 (ref 1.1–2.5)
BUN/Creatinine Ratio: 12 (ref 10–22)
BUN: 15 mg/dL (ref 8–27)
Bilirubin Total: 0.3 mg/dL (ref 0.0–1.2)
CALCIUM: 9.3 mg/dL (ref 8.6–10.2)
CO2: 30 mmol/L — AB (ref 18–29)
Chloride: 99 mmol/L (ref 97–108)
Creatinine, Ser: 1.23 mg/dL (ref 0.76–1.27)
GFR calc Af Amer: 72 mL/min/{1.73_m2} (ref >59)
GFR, EST NON AFRICAN AMERICAN: 63 mL/min/{1.73_m2} (ref >59)
GLOBULIN, TOTAL: 2.9 g/dL (ref 1.5–4.5)
GLUCOSE: 110 mg/dL — AB (ref 65–99)
Potassium: 4.5 mmol/L (ref 3.5–5.2)
Sodium: 143 mmol/L (ref 134–144)
Total Protein: 7.2 g/dL (ref 6.0–8.5)

## 2014-03-26 LAB — LIPID PANEL
CHOL/HDL RATIO: 3.8 ratio (ref 0.0–5.0)
Cholesterol, Total: 128 mg/dL (ref 100–199)
HDL: 34 mg/dL — ABNORMAL LOW (ref >39)
LDL Calculated: 73 mg/dL (ref 0–99)
Triglycerides: 107 mg/dL (ref 0–149)
VLDL Cholesterol Cal: 21 mg/dL (ref 5–40)

## 2014-04-03 ENCOUNTER — Encounter: Payer: Self-pay | Admitting: Family Medicine

## 2014-04-03 ENCOUNTER — Ambulatory Visit (INDEPENDENT_AMBULATORY_CARE_PROVIDER_SITE_OTHER): Payer: 59 | Admitting: Family Medicine

## 2014-04-03 VITALS — BP 114/65 | HR 61 | Temp 97.9°F | Ht 71.0 in | Wt 253.2 lb

## 2014-04-03 DIAGNOSIS — G8929 Other chronic pain: Secondary | ICD-10-CM

## 2014-04-03 DIAGNOSIS — E119 Type 2 diabetes mellitus without complications: Secondary | ICD-10-CM | POA: Diagnosis not present

## 2014-04-03 DIAGNOSIS — R1031 Right lower quadrant pain: Secondary | ICD-10-CM | POA: Diagnosis not present

## 2014-04-03 DIAGNOSIS — K921 Melena: Secondary | ICD-10-CM

## 2014-04-03 DIAGNOSIS — I10 Essential (primary) hypertension: Secondary | ICD-10-CM

## 2014-04-03 NOTE — Patient Instructions (Addendum)
Lose 10 lb in next 3 mos.  Diabetes and Foot Care Diabetes may cause you to have problems because of poor blood supply (circulation) to your feet and legs. This may cause the skin on your feet to become thinner, break easier, and heal more slowly. Your skin may become dry, and the skin may peel and crack. You may also have nerve damage in your legs and feet causing decreased feeling in them. You may not notice minor injuries to your feet that could lead to infections or more serious problems. Taking care of your feet is one of the most important things you can do for yourself.  HOME CARE INSTRUCTIONS  Wear shoes at all times, even in the house. Do not go barefoot. Bare feet are easily injured.  Check your feet daily for blisters, cuts, and redness. If you cannot see the bottom of your feet, use a mirror or ask someone for help.  Wash your feet with warm water (do not use hot water) and mild soap. Then pat your feet and the areas between your toes until they are completely dry. Do not soak your feet as this can dry your skin.  Apply a moisturizing lotion or petroleum jelly (that does not contain alcohol and is unscented) to the skin on your feet and to dry, brittle toenails. Do not apply lotion between your toes.  Trim your toenails straight across. Do not dig under them or around the cuticle. File the edges of your nails with an emery board or nail file.  Do not cut corns or calluses or try to remove them with medicine.  Wear clean socks or stockings every day. Make sure they are not too tight. Do not wear knee-high stockings since they may decrease blood flow to your legs.  Wear shoes that fit properly and have enough cushioning. To break in new shoes, wear them for just a few hours a day. This prevents you from injuring your feet. Always look in your shoes before you put them on to be sure there are no objects inside.  Do not cross your legs. This may decrease the blood flow to your  feet.  If you find a minor scrape, cut, or break in the skin on your feet, keep it and the skin around it clean and dry. These areas may be cleansed with mild soap and water. Do not cleanse the area with peroxide, alcohol, or iodine.  When you remove an adhesive bandage, be sure not to damage the skin around it.  If you have a wound, look at it several times a day to make sure it is healing.  Do not use heating pads or hot water bottles. They may burn your skin. If you have lost feeling in your feet or legs, you may not know it is happening until it is too late.  Make sure your health care provider performs a complete foot exam at least annually or more often if you have foot problems. Report any cuts, sores, or bruises to your health care provider immediately. SEEK MEDICAL CARE IF:   You have an injury that is not healing.  You have cuts or breaks in the skin.  You have an ingrown nail.  You notice redness on your legs or feet.  You feel burning or tingling in your legs or feet.  You have pain or cramps in your legs and feet.  Your legs or feet are numb.  Your feet always feel cold. [place]  CARE IF:   There is increasing redness, swelling, or pain in or around a wound.  There is a red line that goes up your leg.  Pus is coming from a wound.  You develop a fever or as directed by your health care provider.  You notice a bad smell coming from an ulcer or wound. Document Released: 12/18/1999 Document Revised: 08/22/2012 Document Reviewed: 05/29/2012 [facility] Patient Information 2015 [place], [place]. This information is not intended to replace advice given to you by your health care provider. Make sure you discuss any questions you have with your health care provider. Calorie Counting for Weight Loss Calories are energy you get from the things you eat and drink. Your body uses this energy to keep you going throughout the day. The number of calories you eat  affects your weight. When you eat more calories than your body needs, your body stores the extra calories as fat. When you eat fewer calories than your body needs, your body burns fat to get the energy it needs. Calorie counting means keeping track of how many calories you eat and drink each day. If you make sure to eat fewer calories than your body needs, you should lose weight. In order for calorie counting to work, you will need to eat the number of calories that are right for you in a day to lose a healthy amount of weight per week. A healthy amount of weight to lose per week is usually 1-2 lb (0.5-0.9 kg). A dietitian can determine how many calories you need in a day and give you suggestions on how to reach your calorie goal.  WHAT IS MY MY PLAN? My goal is to have __________ calories per day.  If I have this many calories per day, I should lose around __________ pounds per week. WHAT DO I NEED TO KNOW ABOUT CALORIE COUNTING? In order to meet your daily calorie goal, you will need to:  Find out how many calories are in each food you would like to eat. Try to do this before you eat.  Decide how much of the food you can eat.  Write down what you ate and how many calories it had. Doing this is called keeping a food log. WHERE DO I FIND CALORIE INFORMATION? The number of calories in a food can be found on a Nutrition Facts label. Note that all the information on a label is based on a specific serving of the food. If a food does not have a Nutrition Facts label, try to look up the calories online or ask your dietitian for help. HOW DO I DECIDE HOW MUCH TO EAT? To decide how much of the food you can eat, you will need to consider both the number of calories in one serving and the size of one serving. This information can be found on the Nutrition Facts label. If a food does not have a Nutrition Facts label, look up the information online or ask your dietitian for help. Remember that calories are  listed per serving. If you choose to have more than one serving of a food, you will have to multiply the calories per serving by the amount of servings you plan to eat. For example, the label on a package of bread might say that a serving size is 1 slice and that there are 90 calories in a serving. If you eat 1 slice, you will have eaten 90 calories. If you eat 2 slices, you will have eaten 180  calories. HOW DO I KEEP A FOOD LOG? After each meal, record the following information in your food log:  What you ate.  How much of it you ate.  How many calories it had.  Then, add up your calories. Keep your food log near you, such as in a small notebook in your pocket. Another option is to use a mobile app or website. Some programs will calculate calories for you and show you how many calories you have left each time you add an item to the log. WHAT ARE SOME CALORIE COUNTING TIPS?  Use your calories on foods and drinks that will fill you up and not leave you hungry. Some examples of this include foods like nuts and nut butters, vegetables, lean proteins, and high-fiber foods (more than 5 g fiber per serving).  Eat nutritious foods and avoid empty calories. Empty calories are calories you get from foods or beverages that do not have many nutrients, such as candy and soda. It is better to have a nutritious high-calorie food (such as an avocado) than a food with few nutrients (such as a bag of chips).  Know how many calories are in the foods you eat most often. This way, you do not have to look up how many calories they have each time you eat them.  Look out for foods that may seem like low-calorie foods but are really high-calorie foods, such as baked goods, soda, and fat-free candy.  Pay attention to calories in drinks. Drinks such as sodas, specialty coffee drinks, alcohol, and juices have a lot of calories yet do not fill you up. Choose low-calorie drinks like water and diet drinks.  Focus your  calorie counting efforts on higher calorie items. Logging the calories in a garden salad that contains only vegetables is less important than calculating the calories in a milk shake.  Find a way of tracking calories that works for you. Get creative. Most people who are successful find ways to keep track of how much they eat in a day, even if they do not count every calorie. WHAT ARE SOME PORTION CONTROL TIPS?  Know how many calories are in a serving. This will help you know how many servings of a certain food you can have.  Use a measuring cup to measure serving sizes. This is helpful when you start out. With time, you will be able to estimate serving sizes for some foods.  Take some time to put servings of different foods on your favorite plates, bowls, and cups so you know what a serving looks like.  Try not to eat straight from a bag or box. Doing this can lead to overeating. Put the amount you would like to eat in a cup or on a plate to make sure you are eating the right portion.  Use smaller plates, glasses, and bowls to prevent overeating. This is a quick and easy way to practice portion control. If your plate is smaller, less food can fit on it.  Try not to multitask while eating, such as watching TV or using your computer. If it is time to eat, sit down at a table and enjoy your food. Doing this will help you to start recognizing when you are full. It will also make you more aware of what and how much you are eating. HOW CAN I CALORIE COUNT WHEN EATING OUT?  Ask for smaller portion sizes or child-sized portions.  Consider sharing an entree and sides instead of getting your  own entree.  If you get your own entree, eat only half. Ask for a box at the beginning of your meal and put the rest of your entree in it so you are not tempted to eat it.  Look for the calories on the menu. If calories are listed, choose the lower calorie options.  Choose dishes that include vegetables, fruits,  whole grains, low-fat dairy products, and lean protein. Focusing on smart food choices from each of the 5 food groups can help you stay on track at restaurants.  Choose items that are boiled, broiled, grilled, or steamed.  Choose water, milk, unsweetened iced tea, or other drinks without added sugars. If you want an alcoholic beverage, choose a lower calorie option. For example, a regular margarita can have up to 700 calories and a glass of wine has around 150.  Stay away from items that are buttered, battered, fried, or served with cream sauce. Items labeled "crispy" are usually fried, unless stated otherwise.  Ask for dressings, sauces, and syrups on the side. These are usually very high in calories, so do not eat much of them.  Watch out for salads. Many people think salads are a healthy option, but this is often not the case. Many salads come with bacon, fried chicken, lots of cheese, fried chips, and dressing. All of these items have a lot of calories. If you want a salad, choose a garden salad and ask for grilled meats or steak. Ask for the dressing on the side, or ask for olive oil and vinegar or lemon to use as dressing.  Estimate how many servings of a food you are given. For example, a serving of cooked rice is  cup or about the size of half a tennis ball or one cupcake wrapper. Knowing serving sizes will help you be aware of how much food you are eating at restaurants. The list below tells you how big or small some common portion sizes are based on everyday objects.  1 oz--4 stacked dice.  3 oz--1 deck of cards.  1 tsp--1 dice.  1 Tbsp-- a Ping-Pong ball.  2 Tbsp--1 Ping-Pong ball.   cup--1 tennis ball or 1 cupcake wrapper.  1 cup--1 baseball. Document Released: 12/20/2004 Document Revised: 05/06/2013 Document Reviewed: 10/25/2012 [facility] Patient Information 2015 [place], [place]. This information is not intended to replace advice given to you by your health care provider.  Make sure you discuss any questions you have with your health care provider. Basic Carbohydrate Counting for Diabetes Mellitus Carbohydrate counting is a method for keeping track of the amount of carbohydrates you eat. Eating carbohydrates naturally increases the level of sugar (glucose) in your blood, so it is important for you to know the amount that is okay for you to have in every meal. Carbohydrate counting helps keep the level of glucose in your blood within normal limits. The amount of carbohydrates allowed is different for every person. A dietitian can help you calculate the amount that is right for you. Once you know the amount of carbohydrates you can have, you can count the carbohydrates in the foods you want to eat. Carbohydrates are found in the following foods:  Grains, such as breads and cereals.  Dried beans and soy products.  Starchy vegetables, such as potatoes, peas, and corn.  Fruit and fruit juices.  Milk and yogurt.  Sweets and snack foods, such as cake, cookies, candy, chips, soft drinks, and fruit drinks. CARBOHYDRATE COUNTING There are two ways to count the  carbohydrates in your food. You can use either of the methods or a combination of both. Reading the "Nutrition Facts" on [place] The "Nutrition Facts" is an area that is included on the labels of almost all packaged food and beverages in the [place]. It includes the serving size of that food or beverage and information about the nutrients in each serving of the food, including the grams (g) of carbohydrate per serving.  Decide the number of servings of this food or beverage that you will be able to eat or drink. Multiply that number of servings by the number of grams of carbohydrate that is listed on the label for that serving. The total will be the amount of carbohydrates you will be having when you eat or drink this food or beverage. Learning Standard Serving Sizes of Food When you eat food that is not  packaged or does not include "Nutrition Facts" on the label, you need to measure the servings in order to count the amount of carbohydrates.A serving of most carbohydrate-rich foods contains about 15 g of carbohydrates. The following list includes serving sizes of carbohydrate-rich foods that provide 15 g ofcarbohydrate per serving:   1 slice of bread (1 oz) or 1 six-inch tortilla.    of a hamburger bun or English muffin.  4-6 crackers.   cup unsweetened dry cereal.    cup hot cereal.   cup rice or pasta.    cup mashed potatoes or  of a large baked potato.  1 cup fresh fruit or one small piece of fruit.    cup canned or frozen fruit or fruit juice.  1 cup milk.   cup plain fat-free yogurt or yogurt sweetened with artificial sweeteners.   cup cooked dried beans or starchy vegetable, such as peas, corn, or potatoes.  Decide the number of standard-size servings that you will eat. Multiply that number of servings by 15 (the grams of carbohydrates in that serving). For example, if you eat 2 cups of strawberries, you will have eaten 2 servings and 30 g of carbohydrates (2 servings x 15 g = 30 g). For foods such as soups and casseroles, in which more than one food is mixed in, you will need to count the carbohydrates in each food that is included. EXAMPLE OF CARBOHYDRATE COUNTING Sample Dinner  3 oz chicken breast.   cup of brown rice.   cup of corn.  1 cup milk.   1 cup strawberries with sugar-free whipped topping.  Carbohydrate Calculation Step 1: Identify the foods that contain carbohydrates:   Rice.   Corn.   Milk.   Strawberries. Step 2:Calculate the number of servings eaten of each:   2 servings of rice.   1 serving of corn.   1 serving of milk.   1 serving of strawberries. Step 3: Multiply each of those number of servings by 15 g:   2 servings of rice x 15 g = 30 g.   1 serving of corn x 15 g = 15 g.   1 serving of milk x 15  g = 15 g.   1 serving of strawberries x 15 g = 15 g. Step 4: Add together all of the amounts to find the total grams of carbohydrates eaten: 30 g + 15 g + 15 g + 15 g = 75 g. Document Released: 12/20/2004 Document Revised: 05/06/2013 Document Reviewed: 11/16/2012 [facility] Patient Information 2015 [place], [place]. This information is not intended to replace advice given to  to you by your health care provider. Make sure you discuss any questions you have with your health care provider.  

## 2014-04-03 NOTE — Progress Notes (Signed)
Subjective:  Patient ID: Stephen Lawson, male    DOB: 1950/10/05  Age: 64 y.o. MRN: [id number]  CC: Diabetes; Hypertension; and Hematochezia   HPI Stephen Lawson presents forFollow-up of diabetes. Patient does not check blood sugar at home Patient denies symptoms such as polyuria, polydipsia, excessive hunger, nausea No significant hypoglycemic spells noted. Medications as noted below. Taking them regularly without complication/adverse reaction being reported today.  Staying away from sugar, including drinks.   follow-up of hypertension. Patient has no history of headache chest pain or shortness of breath or recent cough. Patient also denies symptoms of TIA such as numbness weakness lateralizing. Patient checks  blood pressure at home and has not had any elevated readings recently. Patient denies side effects from his medication. States taking it regularly.   When he wiped his bottom had blood on it. Occurred 1 and 4:00 this morning.Blood on stool. Also has RLQ pain. Onset 3 years ago.thought it was appendicitis. It is now mild to moderate, but persists when disturbed. . No GI sx otherwise.  History Stephen Lawson has a past medical history of Hypertension and Back pain.   He has past surgical history that includes Back surgery; Elbow surgery; Shoulder surgery; and Wrist surgery.   His family history includes Diabetes in his brother; Heart disease in his father and mother.He reports that he quit smoking about 45 years ago. His smoking use included Cigarettes. He started smoking about 47 years ago. He smoked 0.50 packs per day. He uses smokeless tobacco. He reports that he does not drink alcohol or use illicit drugs.  Current Outpatient Prescriptions on File Prior to Visit  Medication Sig Dispense Refill  . atenolol (TENORMIN) 100 MG tablet Take 1 tablet (100 mg total) by mouth 2 (two) times daily. 180 tablet 3  . clobetasol cream (TEMOVATE) 8.24 % Apply 1 application topically 2 (two) times daily. 30  g 3  . diazepam (VALIUM) 5 MG tablet 5 mg every 12 (twelve) hours as needed.   1  . furosemide (LASIX) 40 MG tablet Take 0.5 tablets (20 mg total) by mouth daily. (Patient taking differently: Take 40 mg by mouth daily. ) 30 tablet 5  . KLOR-CON M20 20 MEQ tablet TAKE 1 TABLET DAILY 90 tablet 0  . metFORMIN (GLUCOPHAGE) 500 MG tablet Take 1 tablet (500 mg total) by mouth 2 (two) times daily with a meal. 180 tablet 3  . methadone (DOLOPHINE) 10 MG tablet Take 10 mg by mouth 2 (two) times daily.    . Oxycodone HCl 10 MG TABS Take 1 tablet by mouth daily.  0  . SUMAtriptan (IMITREX) 100 MG tablet TAKE 1 TABLET EVERY 2 HOURS AS NEEDED FOR MIGRAINE-MAY REPEAT IN 2 HOURS IF HEADACHE PERSISTS 15 tablet 6  . levothyroxine (SYNTHROID, LEVOTHROID) 50 MCG tablet Take 1 tablet (50 mcg total) by mouth daily. 90 tablet 3   Current Facility-Administered Medications on File Prior to Visit  Medication Dose Route Frequency Provider Last Rate Last Dose  . levalbuterol (XOPENEX) nebulizer solution 1.25 mg  1.25 mg Nebulization Once Lysbeth Penner, FNP      . levalbuterol Penne Lash) nebulizer solution 1.25 mg  1.25 mg Nebulization Once Lysbeth Penner, FNP        ROS Review of Systems  Constitutional: Negative for fever, chills, diaphoresis and unexpected weight change.  HENT: Negative for congestion, hearing loss, rhinorrhea, sore throat and trouble swallowing.   Respiratory: Negative for cough, chest tightness, shortness of breath and wheezing.  Gastrointestinal: Negative for nausea, vomiting, abdominal pain, diarrhea, constipation and abdominal distention.  Endocrine: Negative for cold intolerance and heat intolerance.  Genitourinary: Negative for dysuria, hematuria and flank pain.  Musculoskeletal: Negative for joint swelling and arthralgias.  Skin: Negative for rash.  Neurological: Negative for dizziness and headaches.  Psychiatric/Behavioral: Negative for dysphoric mood, decreased concentration and  agitation. The patient is not nervous/anxious.     Objective:  BP 114/65 mmHg  Pulse 61  Temp(Src) 97.9 F (36.6 C) (Oral)  Ht 5\' 11"  (1.803 m)  Wt 253 lb 3.2 oz (114.851 kg)  BMI 35.33 kg/m2  BP Readings from Last 3 Encounters:  04/03/14 114/65  02/14/14 145/75  12/02/13 123/68    Wt Readings from Last 3 Encounters:  04/03/14 253 lb 3.2 oz (114.851 kg)  02/14/14 251 lb 3.2 oz (113.944 kg)  12/02/13 254 lb 9.6 oz (115.486 kg)     Physical Exam  Constitutional: He is oriented to person, place, and time. He appears well-developed and well-nourished. No distress.  HENT:  Head: Normocephalic and atraumatic.  Right Ear: External ear normal.  Left Ear: External ear normal.  Nose: Nose normal.  Mouth/Throat: Oropharynx is clear and moist.  Eyes: Conjunctivae and EOM are normal. Pupils are equal, round, and reactive to light.  Neck: Normal range of motion. Neck supple. No thyromegaly present.  Cardiovascular: Normal rate, regular rhythm and normal heart sounds.   No murmur heard. Pulmonary/Chest: Effort normal and breath sounds normal. No respiratory distress. He has no wheezes. He has no rales.  Abdominal: Soft. Bowel sounds are normal. He exhibits no distension. There is no tenderness.  Lymphadenopathy:    He has no cervical adenopathy.  Neurological: He is alert and oriented to person, place, and time. He has normal reflexes.  Skin: Skin is warm and dry.  Psychiatric: He has a normal mood and affect. His behavior is normal. Judgment and thought content normal.    Lab Results  Component Value Date   HGBA1C 6.6% 03/25/2014   HGBA1C 6.5 12/02/2013   HGBA1C 6.9 09/19/2013    Lab Results  Component Value Date   WBC 8.0 03/25/2014   HGB 14.8 03/25/2014   HCT 48.9 03/25/2014   PLT 246 06/08/2010   GLUCOSE 110* 03/25/2014   CHOL 128 03/25/2014   TRIG 107 03/25/2014   HDL 34* 03/25/2014   LDLCALC 73 03/25/2014   ALT 28 03/25/2014   AST 22 03/25/2014   NA 143  03/25/2014   K 4.5 03/25/2014   CL 99 03/25/2014   CREATININE 1.23 03/25/2014   BUN 15 03/25/2014   CO2 30* 03/25/2014   TSH 3.610 02/14/2014   PSA 0.3 10/25/2012   HGBA1C 6.6% 03/25/2014    No results found.  Assessment & Plan:   Stephen Lawson was seen today for diabetes, hypertension and hematochezia.  Diagnoses and all orders for this visit:  Essential hypertension  Diabetes mellitus without complication  Hematochezia  Abdominal pain, chronic, right lower quadrant   I have discontinued Mr. Paddock's levofloxacin. I am also having him maintain his methadone, atenolol, metFORMIN, clobetasol cream, diazepam, levothyroxine, KLOR-CON M20, Oxycodone HCl, furosemide, and SUMAtriptan. We will continue to administer levalbuterol and levalbuterol.  No orders of the defined types were placed in this encounter.     Follow-up: Return in about 3 months (around 07/03/2014).  Claretta Fraise, M.D.

## 2014-04-10 ENCOUNTER — Other Ambulatory Visit: Payer: Self-pay | Admitting: Family Medicine

## 2014-05-07 IMAGING — CR DG CHEST 2V
2 series · 2 of 2 positions shown · non-contrast
Comparison: PA and lateral chest 11/19/2012 and 08/01/2007.

CLINICAL DATA: Shortness of breath for 6 months, worsening.

EXAM:
CHEST  2 VIEW

[view not recorded (1 of 2)]
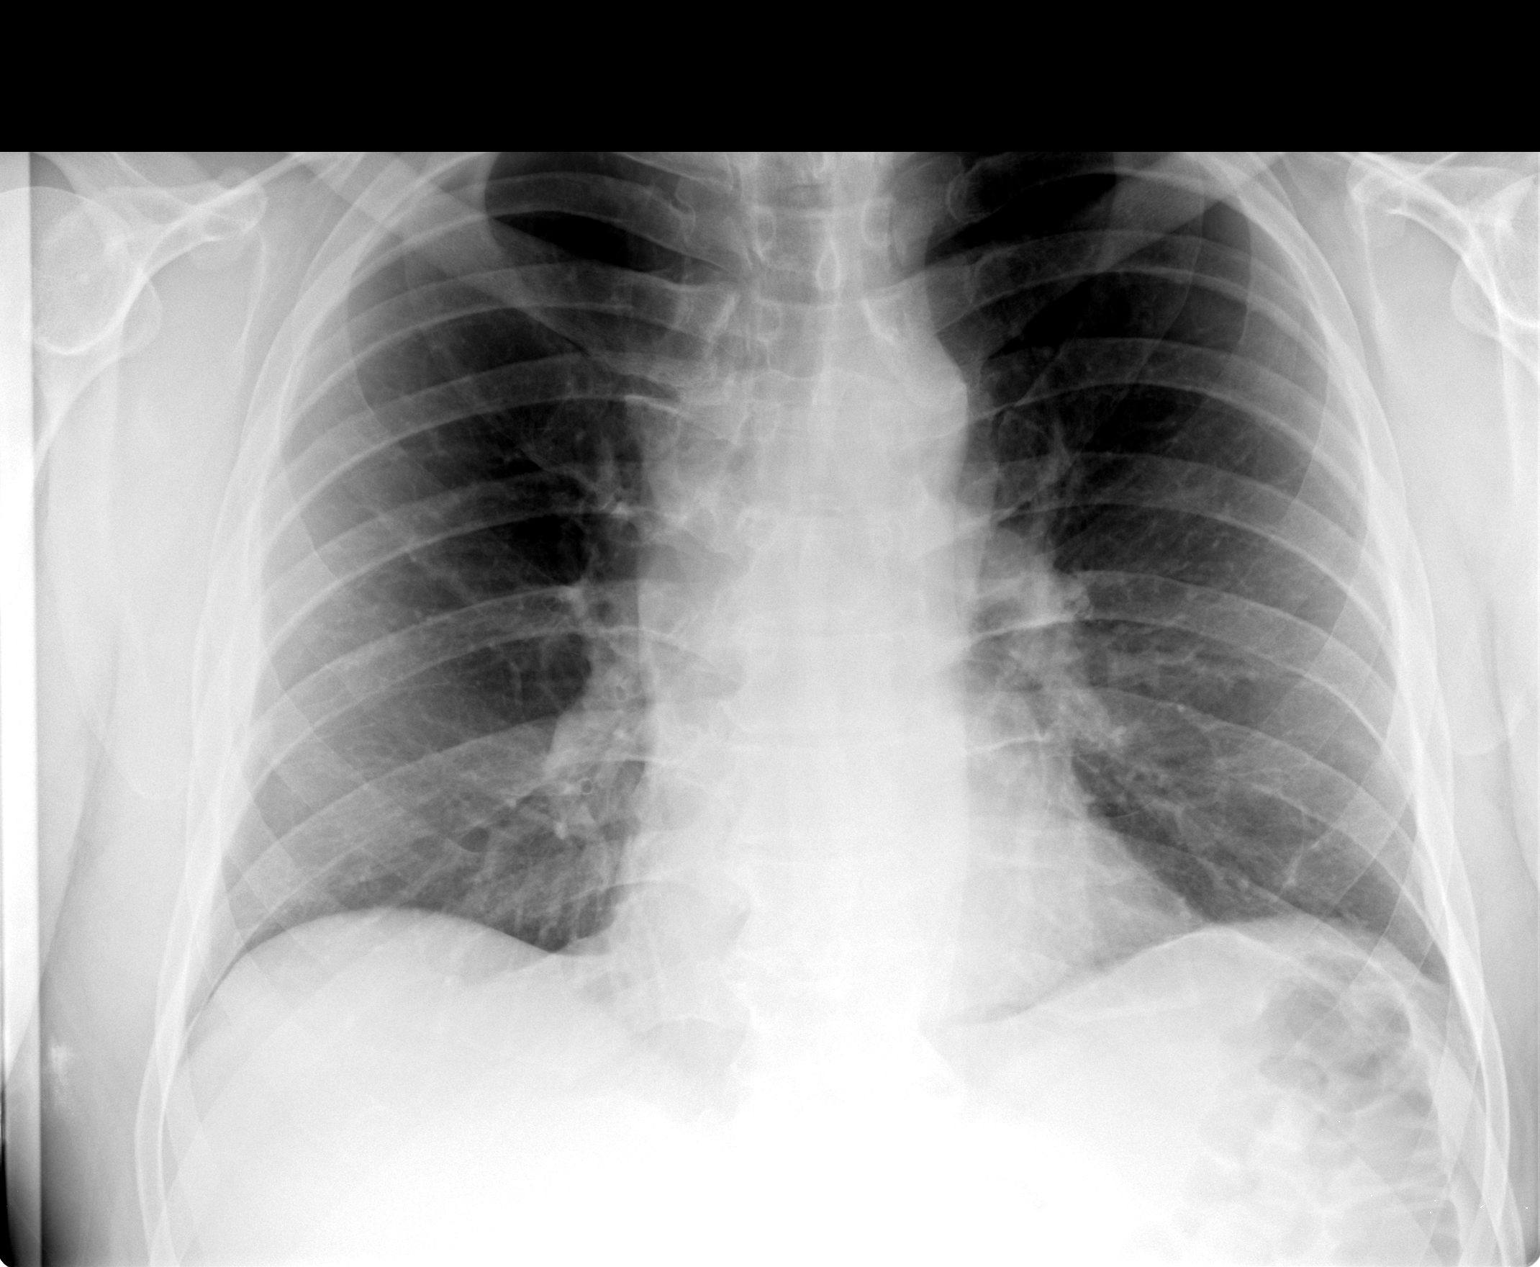

[view not recorded (2 of 2)]
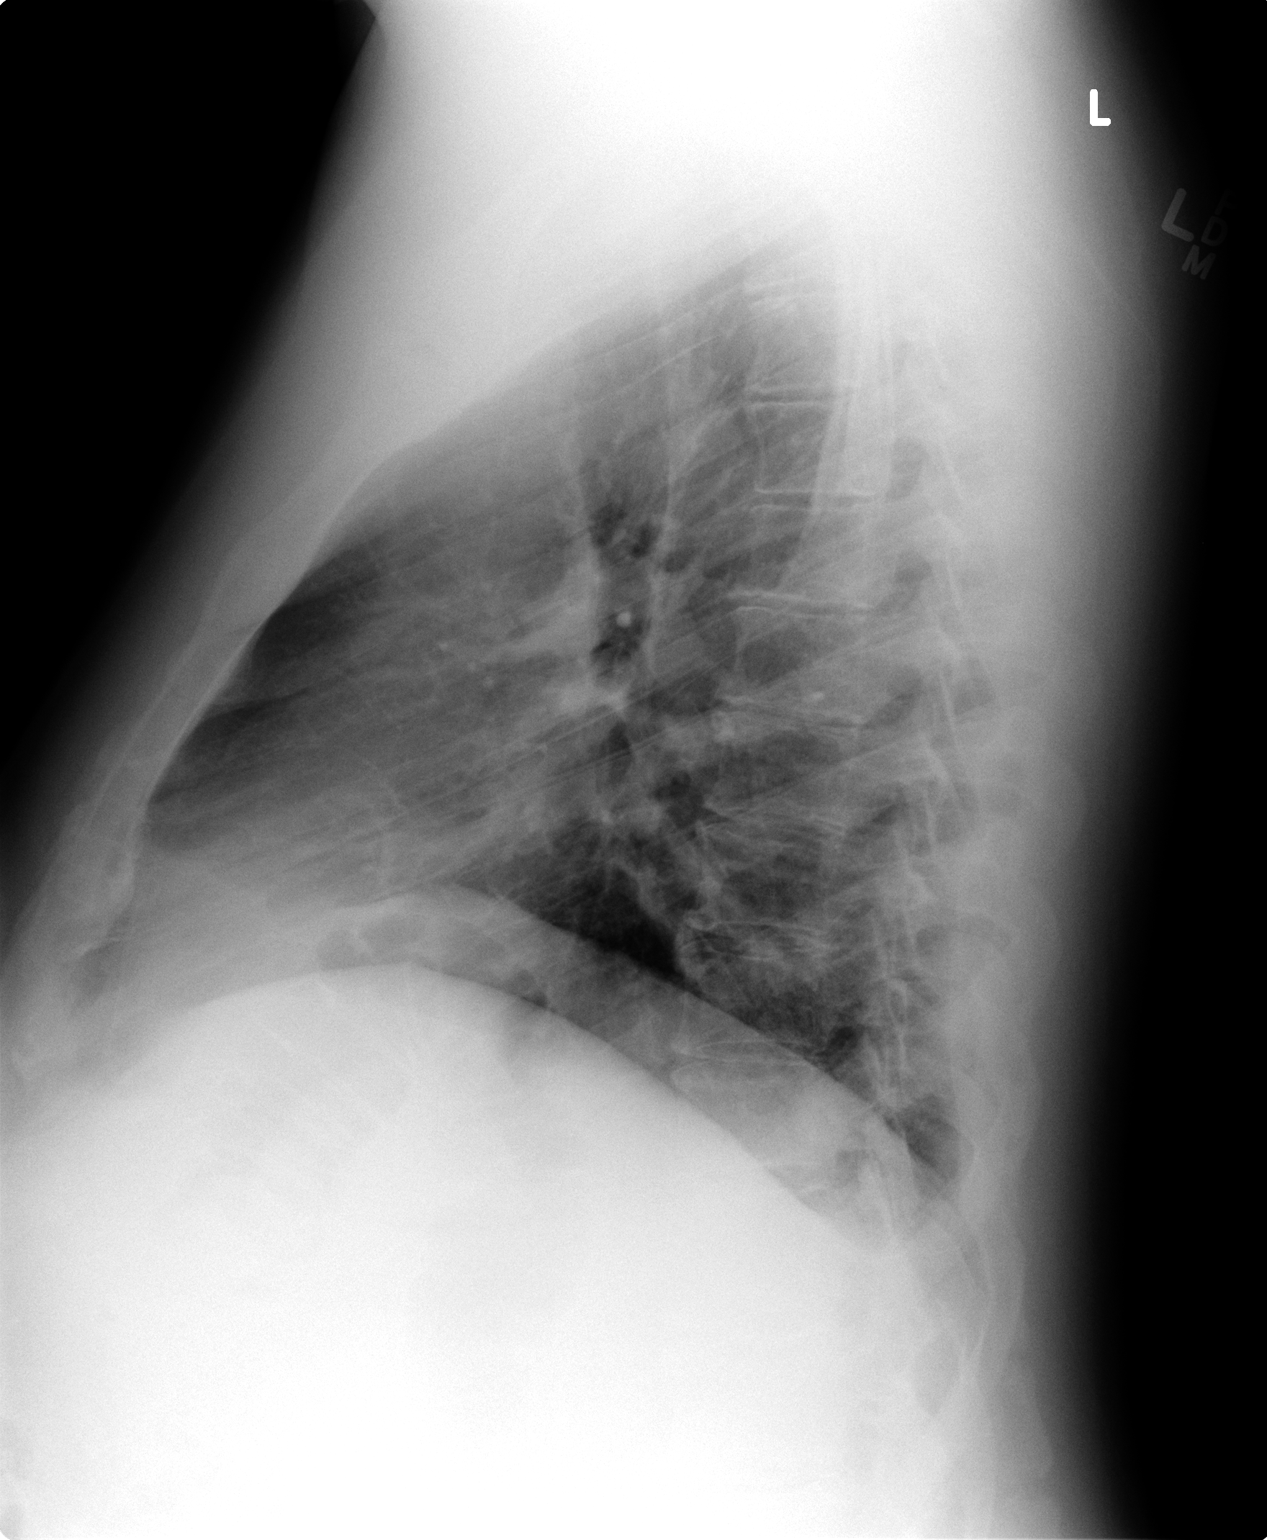

[2 of 2 positions shown; findings below may reference images not displayed]

FINDINGS: Lungs are clear. Heart size is normal. No pneumothorax or pleural
effusion.
IMPRESSION: Negative chest.

## 2014-05-14 ENCOUNTER — Encounter: Payer: Self-pay | Admitting: *Deleted

## 2014-05-14 IMAGING — RF DG ESOPHAGUS
7 of 11 series · 14 of 24 positions shown · non-contrast
Comparison: None

FLUOROSCOPY TIME:  0 min 48 seconds

CLINICAL DATA: Dysphagia.

EXAM:
ESOPHOGRAM / BARIUM SWALLOW / BARIUM TABLET STUDY
TECHNIQUE: Combined double contrast and single contrast examination performed
using effervescent crystals, thick barium liquid, and thin barium
liquid. The patient was observed with fluoroscopy swallowing a 13mm
barium sulphate tablet.

[Series 1: run · 2 of 6 slices shown (1 of 7)]
[im 1/6]
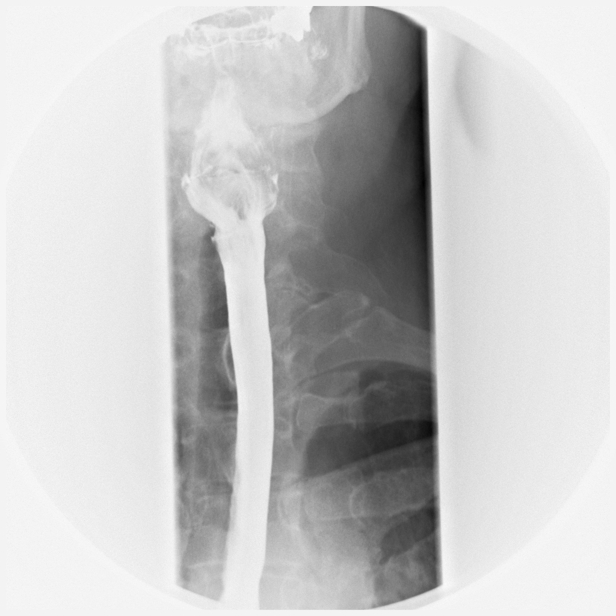
[im 6/6]
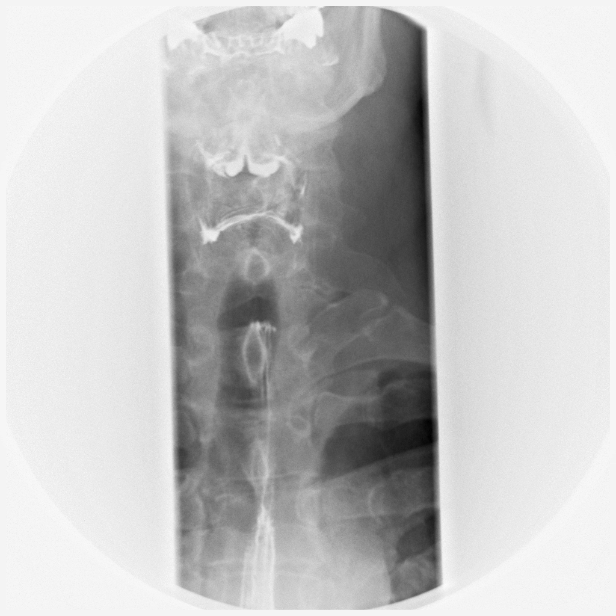

[Series 3: run · 7 of 22 slices shown (2 of 7)]
[im 1/22]
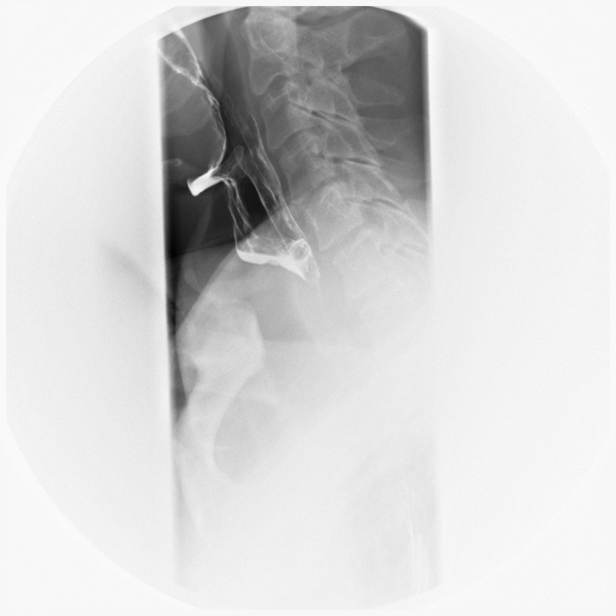
[im 4/22]
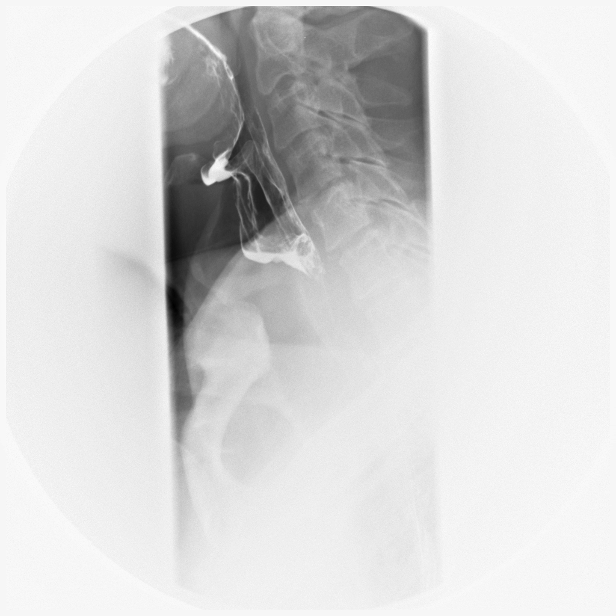
[im 6/22]
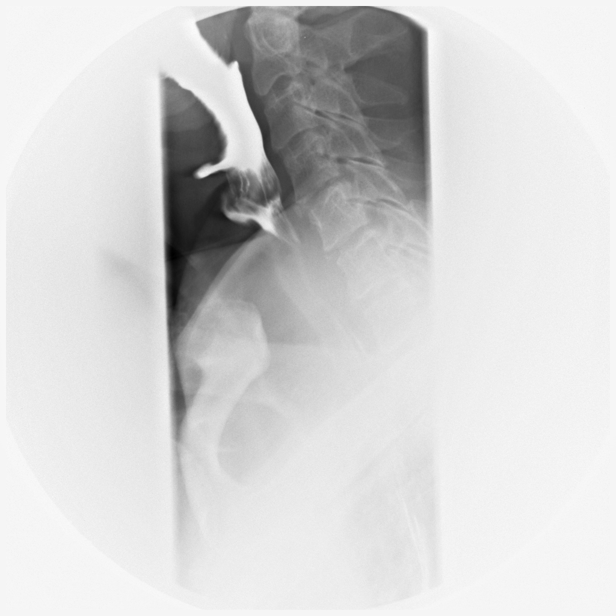
[im 10/22]
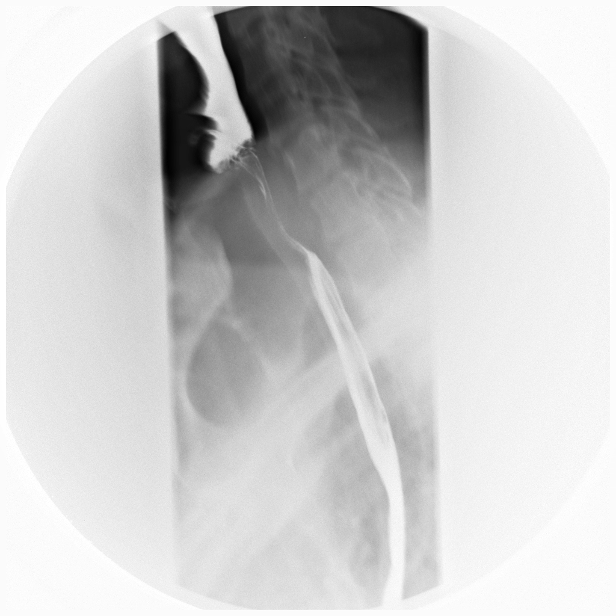
[im 14/22]
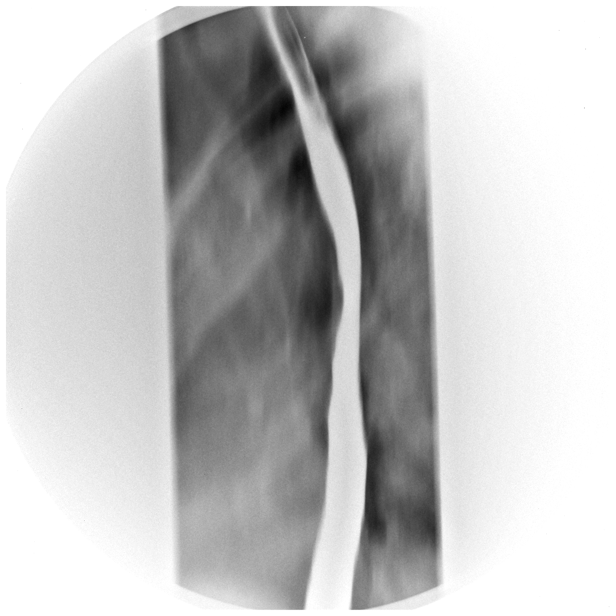
[im 16/22]
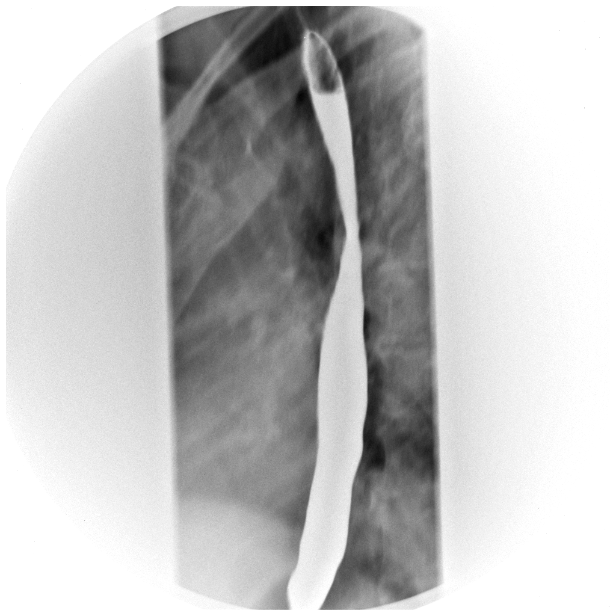
[im 20/22]
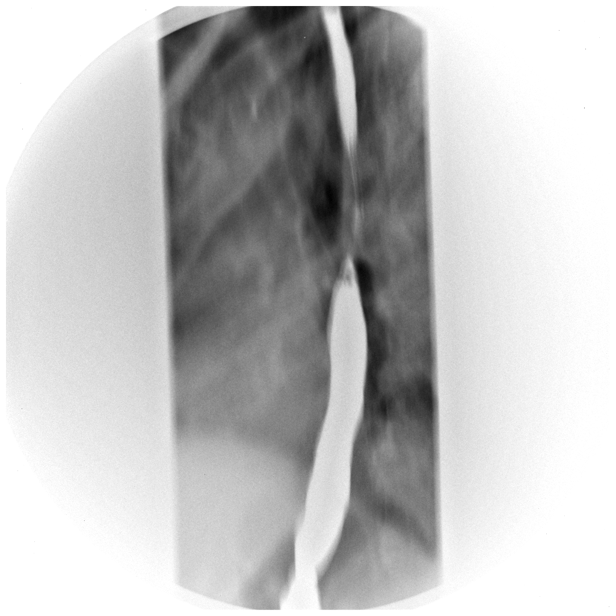

[Series 4: run · 1 of 1 slices shown (3 of 7)]
[im 1/1]
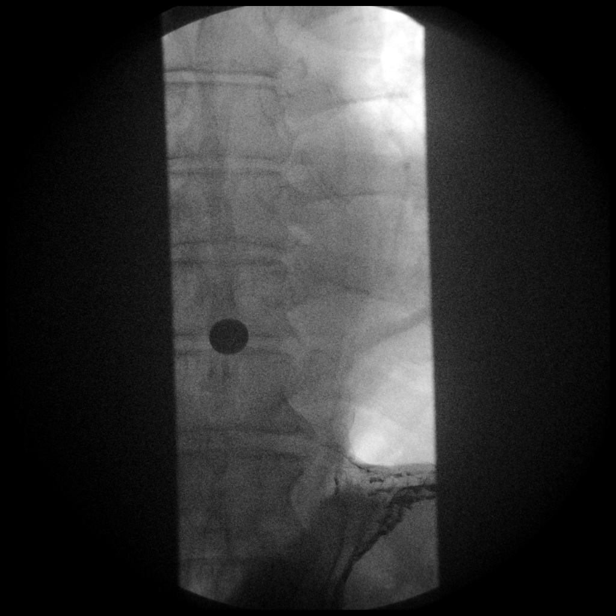

[Series 6: run · 1 of 1 slices shown (4 of 7)]
[im 1/1]
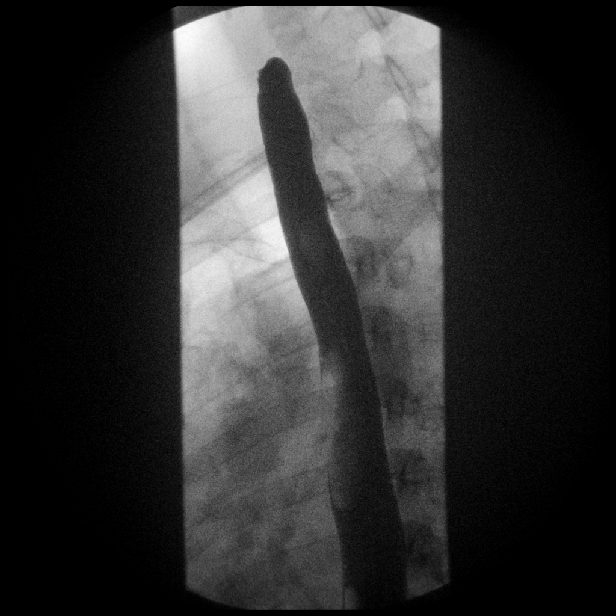

[Series 7: run · 1 of 1 slices shown (5 of 7)]
[im 1/1]
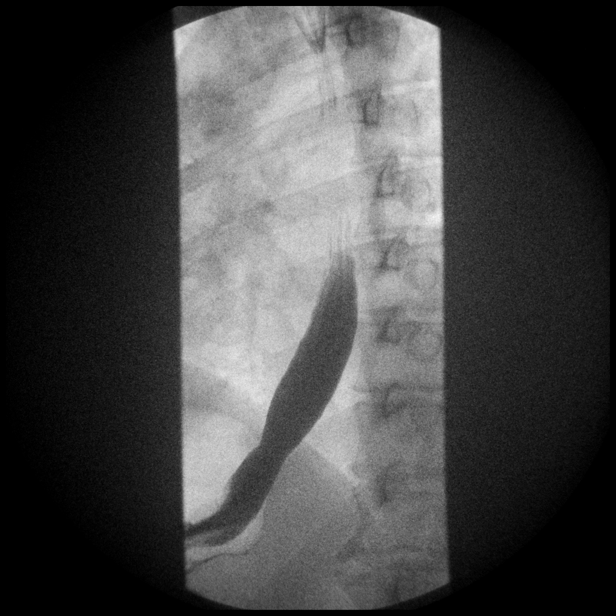

[Series 9: run · 1 of 1 slices shown (6 of 7)]
[im 1/1]
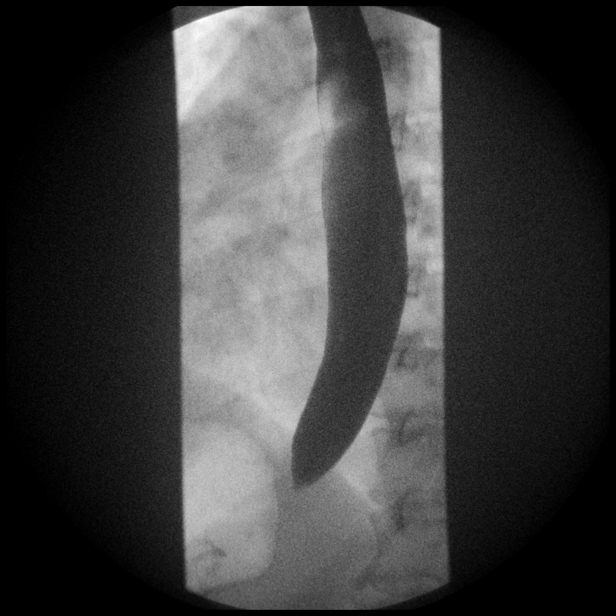

[Series 11: run · 1 of 1 slices shown (7 of 7)]
[im 1/1]
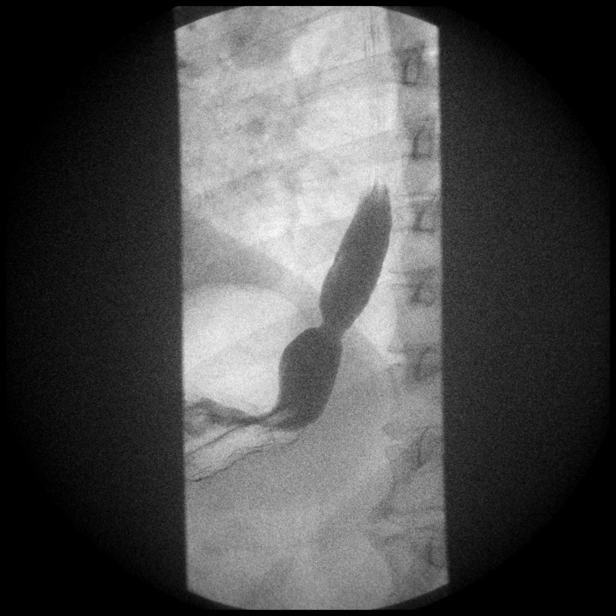

[14 of 24 positions shown; findings below may reference images not displayed]

FINDINGS: The mucosa and motility of the esophagus are normal. The patient had
flash laryngeal penetration which was asymptomatic. There is no mass
lesion or stricture or hiatal hernia. A 13 mm barium tablet passed
from the mouth to the stomach without significant delay. No
esophagitis.
IMPRESSION: Normal barium esophagram with the exception of flash laryngeal
penetration with thick barium. The patient denies any symptoms of
aspiration.

## 2014-05-19 ENCOUNTER — Telehealth: Payer: Self-pay | Admitting: Family Medicine

## 2014-05-19 DIAGNOSIS — R609 Edema, unspecified: Secondary | ICD-10-CM

## 2014-05-19 DIAGNOSIS — I10 Essential (primary) hypertension: Secondary | ICD-10-CM

## 2014-05-20 MED ORDER — FUROSEMIDE 40 MG PO TABS: 40.0000 mg | ORAL_TABLET | Freq: Every day | ORAL | Status: DC

## 2014-05-20 NOTE — Telephone Encounter (Signed)
done

## 2014-07-01 ENCOUNTER — Encounter: Payer: Self-pay | Admitting: Family Medicine

## 2014-07-01 ENCOUNTER — Ambulatory Visit (INDEPENDENT_AMBULATORY_CARE_PROVIDER_SITE_OTHER): Payer: 59 | Admitting: Family Medicine

## 2014-07-01 VITALS — BP 122/62 | HR 55 | Temp 98.1°F | Ht 71.0 in | Wt 244.2 lb

## 2014-07-01 DIAGNOSIS — I10 Essential (primary) hypertension: Secondary | ICD-10-CM | POA: Diagnosis not present

## 2014-07-01 DIAGNOSIS — E119 Type 2 diabetes mellitus without complications: Secondary | ICD-10-CM | POA: Diagnosis not present

## 2014-07-01 DIAGNOSIS — E039 Hypothyroidism, unspecified: Secondary | ICD-10-CM

## 2014-07-01 DIAGNOSIS — N400 Enlarged prostate without lower urinary tract symptoms: Secondary | ICD-10-CM | POA: Diagnosis not present

## 2014-07-01 DIAGNOSIS — G43709 Chronic migraine without aura, not intractable, without status migrainosus: Secondary | ICD-10-CM | POA: Diagnosis not present

## 2014-07-01 DIAGNOSIS — E8881 Metabolic syndrome: Secondary | ICD-10-CM | POA: Diagnosis not present

## 2014-07-01 LAB — POCT CBC

## 2014-07-01 LAB — POCT UA - MICROALBUMIN: MICROALBUMIN (UR) POC: NEGATIVE mg/L

## 2014-07-01 LAB — POCT GLYCOSYLATED HEMOGLOBIN (HGB A1C): HEMOGLOBIN A1C: 6.4

## 2014-07-01 MED ORDER — SUMATRIPTAN SUCCINATE 100 MG PO TABS: ORAL_TABLET | ORAL | Status: DC

## 2014-07-01 MED ORDER — BLOOD GLUCOSE MONITOR KIT: PACK | Status: AC

## 2014-07-01 MED ORDER — ATENOLOL 100 MG PO TABS: 100.0000 mg | ORAL_TABLET | Freq: Every day | ORAL | Status: DC

## 2014-07-01 NOTE — Progress Notes (Signed)
Subjective:  Patient ID: DEMARRION Lawson, male    DOB: [birth year]  Age: 64 y.o. MRN: [id number]  CC: Hypertension; Hypothyroidism; metabolic syndrome; and Benign Prostatic Hypertrophy   HPI Stephen Lawson presents for  follow-up of hypertension. Patient has no history of headache chest pain or shortness of breath or recent cough. Patient also denies symptoms of TIA such as numbness weakness lateralizing. Patient checks  blood pressure at home and has not had any elevated readings recently. Patient denies side effects from his medication. States taking it regularly.  Follow-up of metabolic syndrome and mild diabetes. Patient does not check blood sugar at home.Patient denies symptoms such as polyuria, polydipsia, excessive hunger, nausea No hypoglycemic spells noted. Medications as noted below. Taking them regularly without complication/adverse reaction being reported today.   Patient also suffers from migraines for which she takes sumatriptan. As long as he catches it early he does well with the medication. Of note is he also takes methadone. He is given that by his pain doctor, Dr. Darrow Bussing from pain clinic that comes to a local facility from [facility].  Patient presents for follow-up on  thyroid. She has a history of hypothyroidism for many years. It has been stable recently. Pt. denies any change in  voice, loss of hair, heat or cold intolerance. Energy level has been adequate to good. She denies constipation and diarrhea. No myxedema. Medication is as noted below. Verified that pt is taking it daily on an empty stomach. Well tolerated.   History Stephen Lawson has a past medical history of Hypertension and Back pain.   He has past surgical history that includes Back surgery; Elbow surgery; Shoulder surgery; and Wrist surgery.   His family history includes Diabetes in his brother; Heart disease in his father and mother.He reports that he quit smoking about 45 years ago. His smoking use included Cigarettes.  He started smoking about 47 years ago. He smoked 0.50 packs per day. He uses smokeless tobacco. He reports that he does not drink alcohol or use illicit drugs.  Current Outpatient Prescriptions on File Prior to Visit  Medication Sig Dispense Refill  . clobetasol cream (TEMOVATE) 7.25 % Apply 1 application topically 2 (two) times daily. 30 g 3  . diazepam (VALIUM) 5 MG tablet 5 mg every 12 (twelve) hours as needed.   1  . furosemide (LASIX) 40 MG tablet Take 1 tablet (40 mg total) by mouth daily. 90 tablet 1  . KLOR-CON M20 20 MEQ tablet TAKE 1 TABLET DAILY 90 tablet 0  . levothyroxine (SYNTHROID, LEVOTHROID) 50 MCG tablet Take 1 tablet (50 mcg total) by mouth daily. 90 tablet 3  . metFORMIN (GLUCOPHAGE) 500 MG tablet Take 1 tablet (500 mg total) by mouth 2 (two) times daily with a meal. 180 tablet 3  . methadone (DOLOPHINE) 10 MG tablet Take 10 mg by mouth 2 (two) times daily.    . Oxycodone HCl 10 MG TABS Take 1 tablet by mouth daily.  0   Current Facility-Administered Medications on File Prior to Visit  Medication Dose Route Frequency Provider Last Rate Last Dose  . levalbuterol (XOPENEX) nebulizer solution 1.25 mg  1.25 mg Nebulization Once Lysbeth Penner, FNP      . levalbuterol Penne Lash) nebulizer solution 1.25 mg  1.25 mg Nebulization Once Lysbeth Penner, FNP        ROS Review of Systems  Constitutional: Negative for fever, chills and diaphoresis.  HENT: Negative for congestion, rhinorrhea and sore throat.   Respiratory:  Negative for cough, shortness of breath and wheezing.   Cardiovascular: Negative for chest pain.  Gastrointestinal: Negative for nausea, vomiting, abdominal pain, diarrhea, constipation and abdominal distention.  Genitourinary: Negative for dysuria and frequency.  Musculoskeletal: Negative for joint swelling and arthralgias.  Skin: Negative for rash.  Neurological: Negative for headaches.    Objective:  BP 122/62 mmHg  Pulse 55  Temp(Src) 98.1 F (36.7  C) (Oral)  Ht 5' 11"  (1.803 m)  Wt 244 lb 3.2 oz (110.768 kg)  BMI 34.07 kg/m2  BP Readings from Last 3 Encounters:  07/01/14 122/62  04/03/14 114/65  02/14/14 145/75    Wt Readings from Last 3 Encounters:  07/01/14 244 lb 3.2 oz (110.768 kg)  04/03/14 253 lb 3.2 oz (114.851 kg)  02/14/14 251 lb 3.2 oz (113.944 kg)     Physical Exam  Constitutional: He is oriented to person, place, and time. He appears well-developed and well-nourished. No distress.  HENT:  Head: Normocephalic and atraumatic.  Right Ear: External ear normal.  Left Ear: External ear normal.  Nose: Nose normal.  Mouth/Throat: Oropharynx is clear and moist.  Eyes: Conjunctivae and EOM are normal. Pupils are equal, round, and reactive to light.  Neck: Normal range of motion. Neck supple. No thyromegaly present.  Cardiovascular: Normal rate, regular rhythm and normal heart sounds.   No murmur heard. Pulmonary/Chest: Effort normal and breath sounds normal. No respiratory distress. He has no wheezes. He has no rales.  Abdominal: Soft. Bowel sounds are normal. He exhibits no distension. There is no tenderness.  Lymphadenopathy:    He has no cervical adenopathy.  Neurological: He is alert and oriented to person, place, and time. He has normal reflexes.  Skin: Skin is warm and dry.  Psychiatric: He has a normal mood and affect. His behavior is normal. Judgment and thought content normal.    Lab Results  Component Value Date   HGBA1C 6.4 07/01/2014   HGBA1C 6.6% 03/25/2014   HGBA1C 6.5 12/02/2013    Lab Results  Component Value Date   WBC 8.0 03/25/2014   HGB 14.8 03/25/2014   HCT 48.9 03/25/2014   PLT 246 06/08/2010   GLUCOSE 110* 03/25/2014   CHOL 128 03/25/2014   TRIG 107 03/25/2014   HDL 34* 03/25/2014   LDLCALC 73 03/25/2014   ALT 28 03/25/2014   AST 22 03/25/2014   NA 143 03/25/2014   K 4.5 03/25/2014   CL 99 03/25/2014   CREATININE 1.23 03/25/2014   BUN 15 03/25/2014   CO2 30* 03/25/2014     TSH 3.610 02/14/2014   PSA 0.3 10/25/2012   HGBA1C 6.4 07/01/2014    No results found.  Assessment & Plan:   Stephen Lawson was seen today for hypertension, hypothyroidism, metabolic syndrome and benign prostatic hypertrophy.  Diagnoses and all orders for this visit:  Essential hypertension Orders: -     POCT CBC -     CMP14+EGFR -     atenolol (TENORMIN) 100 MG tablet; Take 1 tablet (100 mg total) by mouth daily.  Metabolic syndrome  BPH (benign prostatic hypertrophy) Orders: -     CMP14+EGFR -     PSA, total and free  Hypothyroidism, unspecified hypothyroidism type Orders: -     CMP14+EGFR -     TSH -     T4, Free  Diabetes mellitus type 2, uncomplicated Orders: -     POCT glycosylated hemoglobin (Hb A1C) -     POCT UA - Microalbumin -  CMP14+EGFR -     Lipid panel -     blood glucose meter kit and supplies KIT; Dispense based on patient and insurance preference. Use up to four times daily as directed. (FOR ICD-9 250.00, 250.01).  Chronic migraine without aura without status migrainosus, not intractable Orders: -     SUMAtriptan (IMITREX) 100 MG tablet; TAKE 1 TABLET EVERY 2 HOURS AS NEEDED FOR MIGRAINE-MAY REPEAT IN 2 HOURS IF HEADACHE PERSISTS   I have discontinued Stephen Lawson's atenolol. I have also changed his atenolol. Additionally, I am having him start on blood glucose meter kit and supplies. Lastly, I am having him maintain his methadone, metFORMIN, clobetasol cream, diazepam, levothyroxine, KLOR-CON M20, Oxycodone HCl, furosemide, and SUMAtriptan. We will continue to administer levalbuterol and levalbuterol.  Meds ordered this encounter  Medications  . SUMAtriptan (IMITREX) 100 MG tablet    Sig: TAKE 1 TABLET EVERY 2 HOURS AS NEEDED FOR MIGRAINE-MAY REPEAT IN 2 HOURS IF HEADACHE PERSISTS    Dispense:  15 tablet    Refill:  6  . atenolol (TENORMIN) 100 MG tablet    Sig: Take 1 tablet (100 mg total) by mouth daily.    Dispense:  90 tablet    Refill:  3  .  blood glucose meter kit and supplies KIT    Sig: Dispense based on patient and insurance preference. Use up to four times daily as directed. (FOR ICD-9 250.00, 250.01).    Dispense:  1 each    Refill:  0    Order Specific Question:  Number of strips    Answer:  63    Order Specific Question:  Number of lancets    Answer:  60     Follow-up: Return in about 6 months (around 12/31/2014) for diabetes.  Claretta Fraise, M.D.

## 2014-07-01 NOTE — Patient Instructions (Signed)

## 2014-07-02 LAB — PSA, TOTAL AND FREE
PSA, Free Pct: 43.3 %
PSA, Free: 0.13 ng/mL
Prostate Specific Ag, Serum: 0.3 ng/mL (ref 0.0–4.0)

## 2014-07-02 LAB — CMP14+EGFR
ALBUMIN: 4.3 g/dL (ref 3.6–4.8)
ALK PHOS: 82 IU/L (ref 39–117)
ALT: 21 IU/L (ref 0–44)
AST: 19 IU/L (ref 0–40)
Albumin/Globulin Ratio: 1.5 (ref 1.1–2.5)
BUN/Creatinine Ratio: 13 (ref 10–22)
BUN: 15 mg/dL (ref 8–27)
Bilirubin Total: 0.4 mg/dL (ref 0.0–1.2)
CALCIUM: 9.1 mg/dL (ref 8.6–10.2)
CHLORIDE: 98 mmol/L (ref 97–108)
CO2: 28 mmol/L (ref 18–29)
CREATININE: 1.16 mg/dL (ref 0.76–1.27)
GFR calc Af Amer: 78 mL/min/{1.73_m2} (ref >59)
GFR, EST NON AFRICAN AMERICAN: 67 mL/min/{1.73_m2} (ref >59)
Globulin, Total: 2.8 g/dL (ref 1.5–4.5)
Glucose: 106 mg/dL — ABNORMAL HIGH (ref 65–99)
Potassium: 4.2 mmol/L (ref 3.5–5.2)
SODIUM: 141 mmol/L (ref 134–144)
TOTAL PROTEIN: 7.1 g/dL (ref 6.0–8.5)

## 2014-07-02 LAB — CBC WITH DIFFERENTIAL/PLATELET
BASOS: 1 %
Basophils Absolute: 0 10*3/uL (ref 0.0–0.2)
EOS (ABSOLUTE): 0.3 10*3/uL (ref 0.0–0.4)
Eos: 4 %
HEMOGLOBIN: 14.3 g/dL (ref 12.6–17.7)
Hematocrit: 43.3 % (ref 37.5–51.0)
IMMATURE GRANS (ABS): 0 10*3/uL (ref 0.0–0.1)
IMMATURE GRANULOCYTES: 0 %
LYMPHS: 30 %
Lymphocytes Absolute: 2.3 10*3/uL (ref 0.7–3.1)
MCH: 26.7 pg (ref 26.6–33.0)
MCHC: 33 g/dL (ref 31.5–35.7)
MCV: 81 fL (ref 79–97)
MONOCYTES: 7 %
Monocytes Absolute: 0.5 10*3/uL (ref 0.1–0.9)
NEUTROS PCT: 58 %
Neutrophils Absolute: 4.4 10*3/uL (ref 1.4–7.0)
Platelets: 279 10*3/uL (ref 150–379)
RBC: 5.36 x10E6/uL (ref 4.14–5.80)
RDW: 14.5 % (ref 12.3–15.4)
WBC: 7.6 10*3/uL (ref 3.4–10.8)

## 2014-07-02 LAB — LIPID PANEL
CHOLESTEROL TOTAL: 131 mg/dL (ref 100–199)
Chol/HDL Ratio: 3.6 ratio units (ref 0.0–5.0)
HDL: 36 mg/dL — AB (ref >39)
LDL Calculated: 80 mg/dL (ref 0–99)
Triglycerides: 74 mg/dL (ref 0–149)
VLDL Cholesterol Cal: 15 mg/dL (ref 5–40)

## 2014-07-02 LAB — TSH: TSH: 4.4 u[IU]/mL (ref 0.450–4.500)

## 2014-07-02 LAB — T4, FREE: Free T4: 1.18 ng/dL (ref 0.82–1.77)

## 2014-07-03 ENCOUNTER — Other Ambulatory Visit: Payer: Self-pay | Admitting: Family Medicine

## 2014-07-03 MED ORDER — LEVOTHYROXINE SODIUM 75 MCG PO TABS: 75.0000 ug | ORAL_TABLET | Freq: Every day | ORAL | Status: DC

## 2014-07-04 ENCOUNTER — Ambulatory Visit: Payer: 59 | Admitting: Family Medicine

## 2014-07-05 ENCOUNTER — Telehealth: Payer: Self-pay | Admitting: *Deleted

## 2014-07-05 NOTE — Telephone Encounter (Signed)
lmtcb regarding test results. 

## 2014-07-05 NOTE — Telephone Encounter (Signed)
-----   Message from Claretta Fraise, MD sent at 07/03/2014  5:16 PM EDT ----- Your thyroid result shows your dose to be too low. I have sent in a prescription for a higher dose that should help you feel better. I would like to see you again in approximately 6-8 weeks for this. Come by a couple of days early to have your blood test, drawn so we can go over the results when you arrive for your appointment.   Nurse, please put in future order for TSH and free T4. Thanks, WS.

## 2014-07-21 ENCOUNTER — Encounter: Payer: Self-pay | Admitting: *Deleted

## 2014-07-21 ENCOUNTER — Telehealth: Payer: Self-pay | Admitting: Family Medicine

## 2014-07-21 DIAGNOSIS — G43709 Chronic migraine without aura, not intractable, without status migrainosus: Secondary | ICD-10-CM

## 2014-07-21 MED ORDER — SUMATRIPTAN SUCCINATE 100 MG PO TABS: ORAL_TABLET | ORAL | Status: DC

## 2014-07-21 NOTE — Telephone Encounter (Signed)
Patient aware that he should be on levothyroxine 61mcg and he also needs a refill on imitrex and would like 6 mth refills if possible

## 2014-07-21 NOTE — Telephone Encounter (Signed)
Patient aware rx was sent to pharmacy for imitrex.

## 2014-09-03 ENCOUNTER — Other Ambulatory Visit: Payer: Self-pay | Admitting: Family Medicine

## 2014-12-19 ENCOUNTER — Ambulatory Visit (INDEPENDENT_AMBULATORY_CARE_PROVIDER_SITE_OTHER): Payer: 59 | Admitting: Family Medicine

## 2014-12-19 ENCOUNTER — Encounter: Payer: Self-pay | Admitting: Family Medicine

## 2014-12-19 VITALS — BP 153/74 | HR 68 | Temp 97.1°F | Ht 71.0 in | Wt 253.8 lb

## 2014-12-19 DIAGNOSIS — E119 Type 2 diabetes mellitus without complications: Secondary | ICD-10-CM

## 2014-12-19 DIAGNOSIS — G43709 Chronic migraine without aura, not intractable, without status migrainosus: Secondary | ICD-10-CM

## 2014-12-19 DIAGNOSIS — I10 Essential (primary) hypertension: Secondary | ICD-10-CM

## 2014-12-19 DIAGNOSIS — E038 Other specified hypothyroidism: Secondary | ICD-10-CM

## 2014-12-19 DIAGNOSIS — G43009 Migraine without aura, not intractable, without status migrainosus: Secondary | ICD-10-CM

## 2014-12-19 LAB — POCT GLYCOSYLATED HEMOGLOBIN (HGB A1C): HEMOGLOBIN A1C: 6.7

## 2014-12-19 MED ORDER — SUMATRIPTAN SUCCINATE 100 MG PO TABS: ORAL_TABLET | ORAL | Status: DC

## 2014-12-19 MED ORDER — METFORMIN HCL 500 MG PO TABS: ORAL_TABLET | ORAL | Status: DC

## 2014-12-19 NOTE — Progress Notes (Signed)
Subjective:  Patient ID: Stephen Lawson, male    DOB: 11-07-1950  Age: 64 y.o. MRN: [id number]  CC: Diabetes; Hypertension; and Hypothyroidism   HPI GERRICK RAY presents for  follow-up of hypertension. Patient has no history of chest pain or shortness of breath or recent cough. Patient also denies symptoms of TIA such as numbness weakness lateralizing. Patient checks  blood pressure at home and has not had any elevated readings recently. Patient denies side effects from his medication. States taking it regularly.  Patient also  in for follow-up of elevated cholesterol. Doing well without complaints on current medication. Denies side effects of statin including myalgia and arthralgia and nausea. Also in today for liver function testing. Currently no chest pain, shortness of breath or other cardiovascular related symptoms noted.  Migraines increasing. Waking pt. Up at night 2-3 nights a week. 6-7/10 pain. Relief with sumatriptan. Frequently has to take 2. Took atenolol for prevention in the past. Dr. Earley Favor has tried him on "everything" for prevention.  Taking metformin for DM. Denies side effects. Not checking glucose regularly. Brings in monitor. Few readings available, but avg. Is 188 for 2 weeks. No hypoglycemic spells reported.  Pain followed by Pain management.   History Posey has a past medical history of Hypertension and Back pain.   He has past surgical history that includes Back surgery; Elbow surgery; Shoulder surgery; and Wrist surgery.   His family history includes Diabetes in his brother; Heart disease in his father and mother.He reports that he quit smoking about 46 years ago. His smoking use included Cigarettes. He started smoking about 48 years ago. He smoked 0.50 packs per day. He uses smokeless tobacco. He reports that he does not drink alcohol or use illicit drugs.  Current Outpatient Prescriptions on File Prior to Visit  Medication Sig Dispense Refill  . atenolol  (TENORMIN) 100 MG tablet Take 1 tablet (100 mg total) by mouth daily. 90 tablet 3  . blood glucose meter kit and supplies KIT Dispense based on patient and insurance preference. Use up to four times daily as directed. (FOR ICD-9 250.00, 250.01). 1 each 0  . clobetasol cream (TEMOVATE) 6.81 % Apply 1 application topically 2 (two) times daily. 30 g 3  . diazepam (VALIUM) 5 MG tablet 5 mg every 12 (twelve) hours as needed.   1  . furosemide (LASIX) 40 MG tablet Take 1 tablet (40 mg total) by mouth daily. 90 tablet 1  . KLOR-CON M20 20 MEQ tablet TAKE 1 TABLET DAILY 90 tablet 0  . methadone (DOLOPHINE) 10 MG tablet Take 10 mg by mouth 2 (two) times daily.    . Oxycodone HCl 10 MG TABS Take 1 tablet by mouth daily.  0   Current Facility-Administered Medications on File Prior to Visit  Medication Dose Route Frequency Provider Last Rate Last Dose  . levalbuterol (XOPENEX) nebulizer solution 1.25 mg  1.25 mg Nebulization Once Lysbeth Penner, FNP      . levalbuterol Penne Lash) nebulizer solution 1.25 mg  1.25 mg Nebulization Once Lysbeth Penner, FNP        ROS Review of Systems  Constitutional: Negative for fever, chills, diaphoresis and unexpected weight change.  HENT: Negative for congestion, hearing loss, rhinorrhea and sore throat.   Eyes: Negative for visual disturbance.  Respiratory: Negative for cough and shortness of breath.   Cardiovascular: Negative for chest pain.  Gastrointestinal: Negative for abdominal pain, diarrhea and constipation.  Genitourinary: Negative for dysuria and flank pain.  Skin: Negative for rash.  Neurological: Negative for dizziness and syncope.  Psychiatric/Behavioral: Negative for sleep disturbance and dysphoric mood.    Objective:  BP 153/74 mmHg  Pulse 68  Temp(Src) 97.1 F (36.2 C) (Oral)  Ht 5' 11"  (1.803 m)  Wt 253 lb 12.8 oz (115.123 kg)  BMI 35.41 kg/m2  SpO2 97%  BP Readings from Last 3 Encounters:  12/19/14 153/74  07/01/14 122/62    04/03/14 114/65    Wt Readings from Last 3 Encounters:  12/19/14 253 lb 12.8 oz (115.123 kg)  07/01/14 244 lb 3.2 oz (110.768 kg)  04/03/14 253 lb 3.2 oz (114.851 kg)     Physical Exam  Constitutional: He is oriented to person, place, and time. He appears well-developed and well-nourished. No distress.  HENT:  Head: Normocephalic and atraumatic.  Right Ear: External ear normal.  Left Ear: External ear normal.  Nose: Nose normal.  Mouth/Throat: Oropharynx is clear and moist.  Eyes: Conjunctivae and EOM are normal. Pupils are equal, round, and reactive to light.  Neck: Normal range of motion. Neck supple. No thyromegaly present.  Cardiovascular: Normal rate, regular rhythm and normal heart sounds.   No murmur heard. Pulmonary/Chest: Effort normal and breath sounds normal. No respiratory distress. He has no wheezes. He has no rales.  Abdominal: Soft. Bowel sounds are normal. He exhibits no distension. There is no tenderness.  Musculoskeletal: Normal range of motion. He exhibits no edema.  Lymphadenopathy:    He has no cervical adenopathy.  Neurological: He is alert and oriented to person, place, and time. He has normal reflexes.  Skin: Skin is warm and dry.  Psychiatric: He has a normal mood and affect. His behavior is normal. Judgment and thought content normal.    Lab Results  Component Value Date   HGBA1C 6.7 12/19/2014   HGBA1C 6.4 07/01/2014   HGBA1C 6.6% 03/25/2014    Lab Results  Component Value Date   WBC 6.6 12/19/2014   HGB 14.8 03/25/2014   HCT 45.2 12/19/2014   PLT 246 06/08/2010   GLUCOSE 117* 12/19/2014   CHOL 127 12/19/2014   TRIG 67 12/19/2014   HDL 34* 12/19/2014   LDLCALC 80 12/19/2014   ALT 25 12/19/2014   AST 20 12/19/2014   NA 141 12/19/2014   K 4.5 12/19/2014   CL 100 12/19/2014   CREATININE 1.13 12/19/2014   BUN 17 12/19/2014   CO2 27 12/19/2014   TSH 4.630* 12/19/2014   PSA 0.3 07/01/2014   HGBA1C 6.7 12/19/2014    No results  found.  Assessment & Plan:   Jorel was seen today for diabetes, hypertension and hypothyroidism.  Diagnoses and all orders for this visit:  Diabetes mellitus without complication ([place]) -     POCT glycosylated hemoglobin (Hb A1C) -     Lipid panel -     Microalbumin / creatinine urine ratio -     CMP14+EGFR  Essential hypertension, benign -     Lipid panel -     CBC with Differential/Platelet  Other specified hypothyroidism -     TSH -     T4, Free  Migraine without aura and without status migrainosus, not intractable  Chronic migraine without aura without status migrainosus, not intractable -     SUMAtriptan (IMITREX) 100 MG tablet; TAKE 1 TABLET EVERY 2 HOURS AS NEEDED FOR MIGRAINE-MAY REPEAT IN 2 HOURS IF HEADACHE PERSISTS  Other orders -     metFORMIN (GLUCOPHAGE) 500 MG tablet; TAKE 1 TABLET TWICE  A DAY  WITH MEALS -     Microalbumin / creatinine urine ratio -     T4, free -     TSH   I am having Mr. Catoe maintain his methadone, clobetasol cream, diazepam, KLOR-CON M20, Oxycodone HCl, furosemide, atenolol, blood glucose meter kit and supplies, SUMAtriptan, and metFORMIN. We will continue to administer levalbuterol and levalbuterol.  Meds ordered this encounter  Medications  . SUMAtriptan (IMITREX) 100 MG tablet    Sig: TAKE 1 TABLET EVERY 2 HOURS AS NEEDED FOR MIGRAINE-MAY REPEAT IN 2 HOURS IF HEADACHE PERSISTS    Dispense:  15 tablet    Refill:  6  . metFORMIN (GLUCOPHAGE) 500 MG tablet    Sig: TAKE 1 TABLET TWICE A DAY  WITH MEALS    Dispense:  180 tablet    Refill:  3     Follow-up: Return in about 6 months (around 06/19/2015) for diabetes, hypertension.  Claretta Fraise, M.D.

## 2014-12-20 ENCOUNTER — Other Ambulatory Visit: Payer: Self-pay | Admitting: Family Medicine

## 2014-12-20 LAB — T4, FREE: Free T4: 1.13 ng/dL (ref 0.82–1.77)

## 2014-12-20 LAB — LIPID PANEL
Chol/HDL Ratio: 3.7 ratio units (ref 0.0–5.0)
Cholesterol, Total: 127 mg/dL (ref 100–199)
HDL: 34 mg/dL — ABNORMAL LOW (ref >39)
LDL CALC: 80 mg/dL (ref 0–99)
TRIGLYCERIDES: 67 mg/dL (ref 0–149)
VLDL Cholesterol Cal: 13 mg/dL (ref 5–40)

## 2014-12-20 LAB — CBC WITH DIFFERENTIAL/PLATELET
BASOS ABS: 0 10*3/uL (ref 0.0–0.2)
Basos: 0 %
EOS (ABSOLUTE): 0.2 10*3/uL (ref 0.0–0.4)
Eos: 4 %
Hematocrit: 45.2 % (ref 37.5–51.0)
Hemoglobin: 14.8 g/dL (ref 12.6–17.7)
IMMATURE GRANS (ABS): 0 10*3/uL (ref 0.0–0.1)
Immature Granulocytes: 0 %
Lymphocytes Absolute: 2.1 10*3/uL (ref 0.7–3.1)
Lymphs: 31 %
MCH: 27.1 pg (ref 26.6–33.0)
MCHC: 32.7 g/dL (ref 31.5–35.7)
MCV: 83 fL (ref 79–97)
Monocytes Absolute: 0.4 10*3/uL (ref 0.1–0.9)
Monocytes: 7 %
NEUTROS ABS: 3.9 10*3/uL (ref 1.4–7.0)
NEUTROS PCT: 58 %
PLATELETS: 253 10*3/uL (ref 150–379)
RBC: 5.47 x10E6/uL (ref 4.14–5.80)
RDW: 13.5 % (ref 12.3–15.4)
WBC: 6.6 10*3/uL (ref 3.4–10.8)

## 2014-12-20 LAB — CMP14+EGFR
A/G RATIO: 1.5 (ref 1.1–2.5)
ALBUMIN: 4.2 g/dL (ref 3.6–4.8)
ALK PHOS: 80 IU/L (ref 39–117)
ALT: 25 IU/L (ref 0–44)
AST: 20 IU/L (ref 0–40)
BILIRUBIN TOTAL: 0.5 mg/dL (ref 0.0–1.2)
BUN / CREAT RATIO: 15 (ref 10–22)
BUN: 17 mg/dL (ref 8–27)
CO2: 27 mmol/L (ref 18–29)
CREATININE: 1.13 mg/dL (ref 0.76–1.27)
Calcium: 9.3 mg/dL (ref 8.6–10.2)
Chloride: 100 mmol/L (ref 96–106)
GFR calc Af Amer: 80 mL/min/{1.73_m2} (ref >59)
GFR calc non Af Amer: 69 mL/min/{1.73_m2} (ref >59)
GLOBULIN, TOTAL: 2.8 g/dL (ref 1.5–4.5)
Glucose: 117 mg/dL — ABNORMAL HIGH (ref 65–99)
POTASSIUM: 4.5 mmol/L (ref 3.5–5.2)
SODIUM: 141 mmol/L (ref 134–144)
Total Protein: 7 g/dL (ref 6.0–8.5)

## 2014-12-20 LAB — MICROALBUMIN / CREATININE URINE RATIO
CREATININE, UR: 149.9 mg/dL
MICROALB/CREAT RATIO: 10.6 mg/g creat (ref 0.0–30.0)
Microalbumin, Urine: 15.9 ug/mL

## 2014-12-20 LAB — TSH: TSH: 4.63 u[IU]/mL — AB (ref 0.450–4.500)

## 2014-12-20 MED ORDER — LEVOTHYROXINE SODIUM 88 MCG PO TABS: 88.0000 ug | ORAL_TABLET | Freq: Every day | ORAL | Status: DC

## 2014-12-22 ENCOUNTER — Telehealth: Payer: Self-pay | Admitting: *Deleted

## 2014-12-22 NOTE — Telephone Encounter (Signed)
Pt notified of results Verbalizes understanding He also stated he has not been taking Levothyroxine regularly He wants to try taking meds regularly and have labs rechecked on the old dose

## 2015-01-01 ENCOUNTER — Ambulatory Visit: Payer: 59 | Admitting: Family Medicine

## 2015-02-22 ENCOUNTER — Other Ambulatory Visit: Payer: Self-pay | Admitting: Family Medicine

## 2015-03-16 ENCOUNTER — Telehealth: Payer: Self-pay | Admitting: Family Medicine

## 2015-03-16 ENCOUNTER — Other Ambulatory Visit: Payer: Self-pay | Admitting: Family Medicine

## 2015-03-16 DIAGNOSIS — G43709 Chronic migraine without aura, not intractable, without status migrainosus: Secondary | ICD-10-CM

## 2015-03-16 MED ORDER — SUMATRIPTAN SUCCINATE 100 MG PO TABS: ORAL_TABLET | ORAL | Status: DC

## 2015-03-16 NOTE — Telephone Encounter (Signed)
This refill request was sent to [facility] mail order pharmacy, patient gets this medication at [place].  Can you please send this refill request to them instead.

## 2015-03-16 NOTE — Telephone Encounter (Signed)
Question answered. 

## 2015-03-16 NOTE — Telephone Encounter (Signed)
The requested med has been sent to the pharmacy.  Please let the patient know. Thanks, WS 

## 2015-03-16 NOTE — Telephone Encounter (Signed)
rx sent to pharmacy and pt is aware. 

## 2015-05-13 ENCOUNTER — Encounter: Payer: 59 | Admitting: Family Medicine

## 2015-05-14 ENCOUNTER — Encounter: Payer: Self-pay | Admitting: Family Medicine

## 2015-06-18 ENCOUNTER — Ambulatory Visit: Payer: 59 | Admitting: Family Medicine

## 2015-06-26 ENCOUNTER — Other Ambulatory Visit: Payer: Self-pay

## 2015-06-26 ENCOUNTER — Other Ambulatory Visit: Payer: 59

## 2015-06-26 DIAGNOSIS — I10 Essential (primary) hypertension: Secondary | ICD-10-CM

## 2015-06-26 DIAGNOSIS — Z139 Encounter for screening, unspecified: Secondary | ICD-10-CM

## 2015-06-27 LAB — CBC WITH DIFFERENTIAL/PLATELET
BASOS ABS: 0 10*3/uL (ref 0.0–0.2)
BASOS: 0 %
EOS (ABSOLUTE): 0.3 10*3/uL (ref 0.0–0.4)
Eos: 4 %
HEMOGLOBIN: 14.9 g/dL (ref 12.6–17.7)
Hematocrit: 45.9 % (ref 37.5–51.0)
IMMATURE GRANS (ABS): 0 10*3/uL (ref 0.0–0.1)
IMMATURE GRANULOCYTES: 0 %
LYMPHS: 33 %
Lymphocytes Absolute: 2.9 10*3/uL (ref 0.7–3.1)
MCH: 27.2 pg (ref 26.6–33.0)
MCHC: 32.5 g/dL (ref 31.5–35.7)
MCV: 84 fL (ref 79–97)
MONOCYTES: 10 %
Monocytes Absolute: 0.8 10*3/uL (ref 0.1–0.9)
NEUTROS PCT: 53 %
Neutrophils Absolute: 4.7 10*3/uL (ref 1.4–7.0)
PLATELETS: 290 10*3/uL (ref 150–379)
RBC: 5.48 x10E6/uL (ref 4.14–5.80)
RDW: 13.9 % (ref 12.3–15.4)
WBC: 8.8 10*3/uL (ref 3.4–10.8)

## 2015-06-27 LAB — CMP14+EGFR
A/G RATIO: 1.5 (ref 1.2–2.2)
ALT: 49 IU/L — AB (ref 0–44)
AST: 25 IU/L (ref 0–40)
Albumin: 4.4 g/dL (ref 3.6–4.8)
Alkaline Phosphatase: 90 IU/L (ref 39–117)
BUN/Creatinine Ratio: 21 (ref 10–24)
BUN: 23 mg/dL (ref 8–27)
Bilirubin Total: 0.4 mg/dL (ref 0.0–1.2)
CALCIUM: 9.6 mg/dL (ref 8.6–10.2)
CO2: 27 mmol/L (ref 18–29)
Chloride: 97 mmol/L (ref 96–106)
Creatinine, Ser: 1.11 mg/dL (ref 0.76–1.27)
GFR, EST AFRICAN AMERICAN: 81 mL/min/{1.73_m2} (ref >59)
GFR, EST NON AFRICAN AMERICAN: 70 mL/min/{1.73_m2} (ref >59)
GLUCOSE: 124 mg/dL — AB (ref 65–99)
Globulin, Total: 3 g/dL (ref 1.5–4.5)
POTASSIUM: 4.8 mmol/L (ref 3.5–5.2)
Sodium: 141 mmol/L (ref 134–144)
TOTAL PROTEIN: 7.4 g/dL (ref 6.0–8.5)

## 2015-06-27 LAB — LIPID PANEL
CHOLESTEROL TOTAL: 126 mg/dL (ref 100–199)
Chol/HDL Ratio: 3.4 ratio units (ref 0.0–5.0)
HDL: 37 mg/dL — AB (ref >39)
LDL CALC: 76 mg/dL (ref 0–99)
Triglycerides: 63 mg/dL (ref 0–149)
VLDL CHOLESTEROL CAL: 13 mg/dL (ref 5–40)

## 2015-06-27 LAB — PSA, TOTAL AND FREE
PSA, Free Pct: 46.7 %
PSA, Free: 0.14 ng/mL
Prostate Specific Ag, Serum: 0.3 ng/mL (ref 0.0–4.0)

## 2015-06-29 ENCOUNTER — Encounter: Payer: Self-pay | Admitting: Family Medicine

## 2015-06-29 ENCOUNTER — Ambulatory Visit (INDEPENDENT_AMBULATORY_CARE_PROVIDER_SITE_OTHER): Payer: 59 | Admitting: Family Medicine

## 2015-06-29 VITALS — BP 126/63 | HR 60 | Temp 97.3°F | Ht 71.0 in | Wt 240.8 lb

## 2015-06-29 DIAGNOSIS — G8929 Other chronic pain: Secondary | ICD-10-CM

## 2015-06-29 DIAGNOSIS — M549 Dorsalgia, unspecified: Secondary | ICD-10-CM | POA: Diagnosis not present

## 2015-06-29 DIAGNOSIS — I10 Essential (primary) hypertension: Secondary | ICD-10-CM

## 2015-06-29 DIAGNOSIS — E119 Type 2 diabetes mellitus without complications: Secondary | ICD-10-CM

## 2015-06-29 LAB — BAYER DCA HB A1C WAIVED: HB A1C (BAYER DCA - WAIVED): 6.7 % (ref <7.0)

## 2015-06-29 MED ORDER — LEVOTHYROXINE SODIUM 88 MCG PO TABS: 88.0000 ug | ORAL_TABLET | Freq: Every day | ORAL | Status: DC

## 2015-06-29 MED ORDER — ATENOLOL 100 MG PO TABS: 100.0000 mg | ORAL_TABLET | Freq: Every day | ORAL | Status: DC

## 2015-06-29 MED ORDER — METFORMIN HCL 500 MG PO TABS: ORAL_TABLET | ORAL | Status: DC

## 2015-06-29 NOTE — Progress Notes (Signed)
   HPI  Patient presents today for follow-up.  Hypertension Good medication compliance Patient explains he has some residual right-sided chest pain in the midaxillary line that is left over from an episode of pneumonia 2-3 months ago. Otherwise he has no chest pain He's been watching his diet carefully.  Diabetes He states he has prediabetes, not checking his blood sugar, only on metformin. I explained is perfectly acceptable not to be checking his blood sugar as he is only on metformin and A1c is 6.7 six months ago. He's not able to exercise due to chronic back pain. He has been having right lower back pain that radiates to his right hip/groin recently.  He's been watching his diet closely He works as a Banker at [employer]: Smoking status noted ROS: Per HPI  Objective: BP 126/63 mmHg  Pulse 60  Temp(Src) 97.3 F (36.3 C) (Oral)  Ht 5\' 11"  (1.803 m)  Wt 240 lb 12.8 oz (109.226 kg)  BMI 33.60 kg/m2 Gen: NAD, alert, cooperative with exam HEENT: NCAT CV: RRR, good S1/S2, no murmur Resp: CTABL, no wheezes, non-labored Ext: No edema, warm Neuro: Alert and oriented, No gross deficits  MSK Right hip with negative logroll, small amount of discomfort with flexion and abduction of the lower extremity  Assessment and plan:  # Type 2 diabetes A1c pending Fasting blood sugar 124 Tolerating metformin well, continue Needs ophthalmic exam, discussed, he will follow-up with them. Needs foot exam next visit  # Hypertension Well-controlled on atenolol Refilled   # Chronic back pain Has reduced exercise capacity due to chronic back pain     Orders Placed This Encounter  Procedures  . Bayer DCA Hb A1c Waived    Meds ordered this encounter  Medications  . levothyroxine (SYNTHROID, LEVOTHROID) 88 MCG tablet    Sig: Take 1 tablet (88 mcg total) by mouth daily.    Dispense:  90 tablet    Refill:  3  . atenolol (TENORMIN) 100 MG tablet    Sig: Take 1 tablet (100  mg total) by mouth daily.    Dispense:  90 tablet    Refill:  3  . metFORMIN (GLUCOPHAGE) 500 MG tablet    Sig: TAKE 1 TABLET TWICE A DAY  WITH MEALS    Dispense:  180 tablet    Refill:  [place], MD Ord 06/29/2015, 8:55 AM

## 2015-06-29 NOTE — Patient Instructions (Addendum)
Great to meet you!  I have refilled the medicines  I also recommend: An opthalmologic exam to look for diabetic effect on the eyes Follow up every 3 months

## 2015-08-25 ENCOUNTER — Other Ambulatory Visit: Payer: Self-pay | Admitting: Family Medicine

## 2015-08-25 DIAGNOSIS — G43709 Chronic migraine without aura, not intractable, without status migrainosus: Secondary | ICD-10-CM

## 2015-09-29 ENCOUNTER — Other Ambulatory Visit: Payer: 59

## 2015-09-29 DIAGNOSIS — I10 Essential (primary) hypertension: Secondary | ICD-10-CM

## 2015-09-29 DIAGNOSIS — R7303 Prediabetes: Secondary | ICD-10-CM

## 2015-09-29 DIAGNOSIS — Z139 Encounter for screening, unspecified: Secondary | ICD-10-CM

## 2015-09-29 LAB — BAYER DCA HB A1C WAIVED: HB A1C: 6.5 % (ref <7.0)

## 2015-09-30 LAB — CMP14+EGFR
A/G RATIO: 1.6 (ref 1.2–2.2)
ALT: 22 IU/L (ref 0–44)
AST: 19 IU/L (ref 0–40)
Albumin: 4.3 g/dL (ref 3.6–4.8)
Alkaline Phosphatase: 86 IU/L (ref 39–117)
BUN/Creatinine Ratio: 19 (ref 10–24)
BUN: 20 mg/dL (ref 8–27)
CHLORIDE: 100 mmol/L (ref 96–106)
CO2: 27 mmol/L (ref 18–29)
Calcium: 9.4 mg/dL (ref 8.6–10.2)
Creatinine, Ser: 1.05 mg/dL (ref 0.76–1.27)
GFR calc non Af Amer: 75 mL/min/{1.73_m2} (ref >59)
GFR, EST AFRICAN AMERICAN: 86 mL/min/{1.73_m2} (ref >59)
Globulin, Total: 2.7 g/dL (ref 1.5–4.5)
Glucose: 116 mg/dL — ABNORMAL HIGH (ref 65–99)
POTASSIUM: 5 mmol/L (ref 3.5–5.2)
Sodium: 142 mmol/L (ref 134–144)
Total Protein: 7 g/dL (ref 6.0–8.5)

## 2015-09-30 LAB — HEPATITIS C ANTIBODY: Hep C Virus Ab: <0.1 s/co ratio (ref 0.0–0.9)

## 2015-09-30 LAB — TSH: TSH: 1.99 u[IU]/mL (ref 0.450–4.500)

## 2015-10-02 ENCOUNTER — Ambulatory Visit (INDEPENDENT_AMBULATORY_CARE_PROVIDER_SITE_OTHER): Payer: 59 | Admitting: Family Medicine

## 2015-10-02 ENCOUNTER — Encounter: Payer: Self-pay | Admitting: Family Medicine

## 2015-10-02 VITALS — BP 136/82 | HR 60 | Temp 97.5°F | Ht 71.0 in | Wt 248.0 lb

## 2015-10-02 DIAGNOSIS — E119 Type 2 diabetes mellitus without complications: Secondary | ICD-10-CM | POA: Diagnosis not present

## 2015-10-02 DIAGNOSIS — I499 Cardiac arrhythmia, unspecified: Secondary | ICD-10-CM

## 2015-10-02 DIAGNOSIS — E1159 Type 2 diabetes mellitus with other circulatory complications: Secondary | ICD-10-CM | POA: Insufficient documentation

## 2015-10-02 DIAGNOSIS — I10 Essential (primary) hypertension: Secondary | ICD-10-CM | POA: Diagnosis not present

## 2015-10-02 NOTE — Progress Notes (Signed)
   HPI  Patient presents today here for three-month follow-up for diabetes.  Patient explains that he feels well and is doing well. He has good metformin compliance and does not check her blood sugar. He does watch his diet, his exercise is limited by back pain.  Hypertension Does not check blood pressure at home No chest pain, dyspnea, palpitations, leg edema. He is concerned because he was told he had a small cardiac abnormality on his rhythm strip while he was having a recent colonoscopy. However he is completely asymptomatic, he denies dizziness, weakness, lightheadedness, or other concerns. He will monitor closely.    PMH: Smoking status noted ROS: Per HPI  Objective: BP 136/82   Pulse 60   Temp 97.5 F (36.4 C) (Oral)   Ht 5\' 11"  (1.803 m)   Wt 248 lb (112.5 kg)   BMI 34.59 kg/m  Gen: NAD, alert, cooperative with exam HEENT: NCAT CV: RRR, good S1/S2, no murmur Resp: CTABL, no wheezes, non-labored Ext: No edema, warm Neuro: Alert and oriented, No gross deficits   Diabetic Foot Exam - Simple   Simple Foot Form Visual Inspection No deformities, no ulcerations, no other skin breakdown bilaterally:  Yes Sensation Testing Intact to touch and monofilament testing bilaterally:  Yes Pulse Check Posterior Tibialis and Dorsalis pulse intact bilaterally:  Yes Comments Right great toe with toenail removal.     Assessment and plan:  # Type 2 diabetes Well-controlled on metformin Foot exam is normal today except for right great toe removal by podiatry Follow-up 3 months  # Hypertension Well-controlled on only atenolol No signs of dangerous arrhythmia Discussed and offered EKG today, however he would like to monitor other than perform EKG today  # Healthcare maintenance Hepatitis C antibody is negative.  Unspecified cardiac arrhythmia Some concern by his CRNA while getting a colonoscopy, however it was not described do not have notes to clarify what their  concern was He states that they explain to just mention it to his doctor. He's asymptomatic at this time, defer workup for now. Awaiting results from colonoscopy Cardiac exam normal today    Laroy Apple, MD [facility] 10/02/2015, 8:40 AM

## 2015-10-07 ENCOUNTER — Encounter: Payer: Self-pay | Admitting: *Deleted

## 2015-11-03 ENCOUNTER — Encounter: Payer: Self-pay | Admitting: Family Medicine

## 2015-11-04 ENCOUNTER — Other Ambulatory Visit: Payer: Self-pay | Admitting: Family Medicine

## 2015-11-04 DIAGNOSIS — I10 Essential (primary) hypertension: Secondary | ICD-10-CM

## 2015-11-23 LAB — HM DIABETES EYE EXAM

## 2016-01-01 ENCOUNTER — Ambulatory Visit: Payer: 59 | Admitting: Family Medicine

## 2016-01-05 ENCOUNTER — Other Ambulatory Visit: Payer: 59

## 2016-01-05 DIAGNOSIS — I10 Essential (primary) hypertension: Secondary | ICD-10-CM

## 2016-01-05 DIAGNOSIS — E119 Type 2 diabetes mellitus without complications: Secondary | ICD-10-CM

## 2016-01-05 LAB — BAYER DCA HB A1C WAIVED: HB A1C (BAYER DCA - WAIVED): 6.6 % (ref <7.0)

## 2016-01-06 ENCOUNTER — Encounter: Payer: Self-pay | Admitting: Family Medicine

## 2016-01-06 ENCOUNTER — Ambulatory Visit (INDEPENDENT_AMBULATORY_CARE_PROVIDER_SITE_OTHER): Payer: 59 | Admitting: Family Medicine

## 2016-01-06 VITALS — BP 135/67 | HR 71 | Temp 96.8°F | Ht 71.0 in | Wt 244.6 lb

## 2016-01-06 DIAGNOSIS — E1142 Type 2 diabetes mellitus with diabetic polyneuropathy: Secondary | ICD-10-CM | POA: Diagnosis not present

## 2016-01-06 DIAGNOSIS — I10 Essential (primary) hypertension: Secondary | ICD-10-CM

## 2016-01-06 DIAGNOSIS — L84 Corns and callosities: Secondary | ICD-10-CM | POA: Diagnosis not present

## 2016-01-06 LAB — CMP14+EGFR
A/G RATIO: 1.5 (ref 1.2–2.2)
ALT: 28 IU/L (ref 0–44)
AST: 19 IU/L (ref 0–40)
Albumin: 4.3 g/dL (ref 3.6–4.8)
Alkaline Phosphatase: 85 IU/L (ref 39–117)
BILIRUBIN TOTAL: 0.4 mg/dL (ref 0.0–1.2)
BUN / CREAT RATIO: 16 (ref 10–24)
BUN: 16 mg/dL (ref 8–27)
CHLORIDE: 101 mmol/L (ref 96–106)
CO2: 28 mmol/L (ref 18–29)
Calcium: 9.1 mg/dL (ref 8.6–10.2)
Creatinine, Ser: 1.01 mg/dL (ref 0.76–1.27)
GFR calc non Af Amer: 78 mL/min/{1.73_m2} (ref >59)
GFR, EST AFRICAN AMERICAN: 90 mL/min/{1.73_m2} (ref >59)
Globulin, Total: 2.9 g/dL (ref 1.5–4.5)
Glucose: 136 mg/dL — ABNORMAL HIGH (ref 65–99)
Potassium: 4.7 mmol/L (ref 3.5–5.2)
Sodium: 144 mmol/L (ref 134–144)
TOTAL PROTEIN: 7.2 g/dL (ref 6.0–8.5)

## 2016-01-06 LAB — CBC WITH DIFFERENTIAL/PLATELET
BASOS: 1 %
Basophils Absolute: 0 10*3/uL (ref 0.0–0.2)
EOS (ABSOLUTE): 0.3 10*3/uL (ref 0.0–0.4)
Eos: 3 %
HEMATOCRIT: 46.2 % (ref 37.5–51.0)
Hemoglobin: 15 g/dL (ref 13.0–17.7)
IMMATURE GRANS (ABS): 0 10*3/uL (ref 0.0–0.1)
Immature Granulocytes: 0 %
Lymphocytes Absolute: 2.3 10*3/uL (ref 0.7–3.1)
Lymphs: 29 %
MCH: 26.7 pg (ref 26.6–33.0)
MCHC: 32.5 g/dL (ref 31.5–35.7)
MCV: 82 fL (ref 79–97)
MONOS ABS: 0.6 10*3/uL (ref 0.1–0.9)
Monocytes: 8 %
NEUTROS ABS: 4.7 10*3/uL (ref 1.4–7.0)
Neutrophils: 59 %
PLATELETS: 281 10*3/uL (ref 150–379)
RBC: 5.62 x10E6/uL (ref 4.14–5.80)
RDW: 14.8 % (ref 12.3–15.4)
WBC: 7.8 10*3/uL (ref 3.4–10.8)

## 2016-01-06 MED ORDER — GABAPENTIN 100 MG PO CAPS: 100.0000 mg | ORAL_CAPSULE | Freq: Three times a day (TID) | ORAL | 0 refills | Status: DC

## 2016-01-06 NOTE — Patient Instructions (Signed)
Great to see you!  Your diabetes looks great, Keep up the good work!  Come back in 3 months unless you need [place] sooner.

## 2016-01-06 NOTE — Progress Notes (Signed)
   HPI  Patient presents today for follow-up of diabetes, hypertension, and with complaints of numbness and tingling in the hands and feet.  Type 2 diabetes Good medication compliance, not checking blood sugars. Watching diet very well. Active, although he has retired in the last month.  Hypertension Good medication compliance No chest pain or dyspnea.  Hands and feet numbness has been going on for about 6 months and seems to be getting worse. Symptoms are most irritating during the daytime.  PMH: Smoking status noted ROS: Per HPI  Objective: BP 135/67   Pulse 71   Temp (!) 96.8 F (36 C) (Oral)   Ht 5\' 11"  (1.803 m)   Wt 244 lb 9.6 oz (110.9 kg)   BMI 34.11 kg/m  Gen: NAD, alert, cooperative with exam HEENT: NCAT CV: RRR, good S1/S2, no murmur Resp: CTABL, no wheezes, non-labored Ext: No edema, warm Neuro: Alert and oriented, No gross deficits  Diabetic Foot Exam - Simple   Simple Foot Form Visual Inspection See comments:  Yes Sensation Testing Intact to touch and monofilament testing bilaterally:  Yes Pulse Check Posterior Tibialis and Dorsalis pulse intact bilaterally:  Yes Comments Left foot with tender callus on the lateral forefoot.     Assessment and plan:  # T2DM A1c 6.6, stable Continue metformin only Now with neuropathy and foot callus*  # Diabetic neuropathy Started gabapentin, new problem Discussed titration 100 300 mg 3 times daily  # Foot callus Referring podiatry, no clear ulcer or signs of infection. Possibly pre-ulcerative callus  # Hypertension Well-controlled on atenolol, patient has stopped Lasix.   Orders Placed This Encounter  Procedures  . Ambulatory referral to Podiatry    Referral Priority:   Routine    Referral Type:   Consultation    Referral Reason:   Specialty Services Required    Requested Specialty:   Podiatry    Number of Visits Requested:   1    Meds ordered this encounter  Medications  . gabapentin  (NEURONTIN) 100 MG capsule    Sig: Take 1-3 capsules (100-300 mg total) by mouth 3 (three) times daily.    Dispense:  270 capsule    Refill:  [place], MD [place] 01/06/2016, 9:40 AM

## 2016-01-22 ENCOUNTER — Ambulatory Visit (INDEPENDENT_AMBULATORY_CARE_PROVIDER_SITE_OTHER): Payer: 59

## 2016-01-22 ENCOUNTER — Encounter: Payer: Self-pay | Admitting: Podiatry

## 2016-01-22 ENCOUNTER — Ambulatory Visit (INDEPENDENT_AMBULATORY_CARE_PROVIDER_SITE_OTHER): Payer: 59 | Admitting: Podiatry

## 2016-01-22 DIAGNOSIS — M79672 Pain in left foot: Secondary | ICD-10-CM | POA: Diagnosis not present

## 2016-01-22 DIAGNOSIS — M779 Enthesopathy, unspecified: Secondary | ICD-10-CM

## 2016-01-22 DIAGNOSIS — Q828 Other specified congenital malformations of skin: Secondary | ICD-10-CM

## 2016-01-22 MED ORDER — TRIAMCINOLONE ACETONIDE 10 MG/ML IJ SUSP
10.0000 mg | Freq: Once | INTRAMUSCULAR | Status: AC
  Administered 2016-01-22: 10 mg

## 2016-01-22 NOTE — Progress Notes (Signed)
   Subjective:    Patient ID: Stephen Lawson, male    DOB: December 20, 1950, 66 y.o.   MRN: [id number]  HPI  Chief Complaint  Patient presents with  . Mass    Left lateral side x 9 months. Pt states that "the growth is painful and has grew in size over the months"       Review of Systems     Objective:   Physical Exam        Assessment & Plan:

## 2016-01-22 NOTE — Progress Notes (Signed)
Subjective:     Patient ID: Stephen Lawson, male   DOB: 09/09/1950, 66 y.o.   MRN: [id number]  HPI patient presents with pain in the outside of the left foot that has been present for around 9 months. Does not remember stepping on something and states it's gradually become more discomforting since and especially worsened in the last several months   Review of Systems  All other systems reviewed and are negative.      Objective:   Physical Exam  Constitutional: He is oriented to person, place, and time.  Cardiovascular: Intact distal pulses.   Musculoskeletal: Normal range of motion.  Neurological: He is oriented to person, place, and time.  Skin: Skin is warm.  Nursing note and vitals reviewed.  neurovascular status was found to be intact with muscle strength adequate range of motion within normal limits. Patient's found to have plantar flexion of the fifth metatarsal left with keratotic lesion fluid buildup around the fifth MPJ and quite a bit of pain when palpated. Patient is noted to have good digital perfusion and is well oriented 3     Assessment:     Inflammatory capsulitis fifth MPJ left with pain deformity and lesion formation secondary to pressure    Plan:     H&P and x-rays reviewed and discussed a conservative approach to this condition with the possibilities for further treatment depending on response. Today I injected the plantar capsule fifth MPJ 3 mg dexamethasone Kenalog 5 mg Xylocaine allowed anesthesia to occur and under sterile conditions debrided porokeratotic lesion with sharp technique. Applied sterile dressing to the area advised on reduced activity today and will reappoint as needed when symptoms indicate  X-ray report indicates that there is plantar flexion of the metatarsal with no indications of stress fracture or arthritic condition

## 2016-02-11 ENCOUNTER — Other Ambulatory Visit: Payer: Self-pay | Admitting: Family Medicine

## 2016-02-11 DIAGNOSIS — G43709 Chronic migraine without aura, not intractable, without status migrainosus: Secondary | ICD-10-CM

## 2016-04-05 ENCOUNTER — Other Ambulatory Visit: Payer: Self-pay | Admitting: Family Medicine

## 2016-04-05 DIAGNOSIS — E119 Type 2 diabetes mellitus without complications: Secondary | ICD-10-CM

## 2016-04-07 ENCOUNTER — Ambulatory Visit: Payer: 59 | Admitting: Family Medicine

## 2016-04-11 ENCOUNTER — Ambulatory Visit: Payer: 59 | Admitting: Family Medicine

## 2016-04-21 ENCOUNTER — Ambulatory Visit: Payer: 59 | Admitting: Family Medicine

## 2016-06-12 ENCOUNTER — Other Ambulatory Visit: Payer: Self-pay | Admitting: Family Medicine

## 2016-07-05 ENCOUNTER — Other Ambulatory Visit: Payer: Self-pay | Admitting: Family Medicine

## 2016-07-05 DIAGNOSIS — E119 Type 2 diabetes mellitus without complications: Secondary | ICD-10-CM

## 2016-07-11 ENCOUNTER — Other Ambulatory Visit: Payer: Medicare Other

## 2016-07-11 DIAGNOSIS — Z125 Encounter for screening for malignant neoplasm of prostate: Secondary | ICD-10-CM

## 2016-07-11 DIAGNOSIS — I10 Essential (primary) hypertension: Secondary | ICD-10-CM | POA: Diagnosis not present

## 2016-07-11 DIAGNOSIS — E1142 Type 2 diabetes mellitus with diabetic polyneuropathy: Secondary | ICD-10-CM | POA: Diagnosis not present

## 2016-07-11 LAB — BAYER DCA HB A1C WAIVED: HB A1C (BAYER DCA - WAIVED): 7.2 % — ABNORMAL HIGH (ref <7.0)

## 2016-07-12 ENCOUNTER — Ambulatory Visit (INDEPENDENT_AMBULATORY_CARE_PROVIDER_SITE_OTHER): Payer: Medicare Other | Admitting: Family Medicine

## 2016-07-12 ENCOUNTER — Encounter: Payer: Self-pay | Admitting: Family Medicine

## 2016-07-12 ENCOUNTER — Ambulatory Visit (INDEPENDENT_AMBULATORY_CARE_PROVIDER_SITE_OTHER): Payer: Medicare Other

## 2016-07-12 VITALS — BP 132/68 | HR 73 | Temp 97.8°F | Ht 71.0 in | Wt 248.4 lb

## 2016-07-12 DIAGNOSIS — E1142 Type 2 diabetes mellitus with diabetic polyneuropathy: Secondary | ICD-10-CM

## 2016-07-12 DIAGNOSIS — M25551 Pain in right hip: Secondary | ICD-10-CM

## 2016-07-12 DIAGNOSIS — M25552 Pain in left hip: Secondary | ICD-10-CM | POA: Diagnosis not present

## 2016-07-12 DIAGNOSIS — G43709 Chronic migraine without aura, not intractable, without status migrainosus: Secondary | ICD-10-CM | POA: Diagnosis not present

## 2016-07-12 LAB — CMP14+EGFR
ALBUMIN: 4.1 g/dL (ref 3.6–4.8)
ALK PHOS: 102 IU/L (ref 39–117)
ALT: 31 IU/L (ref 0–44)
AST: 19 IU/L (ref 0–40)
Albumin/Globulin Ratio: 1.3 (ref 1.2–2.2)
BUN / CREAT RATIO: 17 (ref 10–24)
BUN: 18 mg/dL (ref 8–27)
CHLORIDE: 103 mmol/L (ref 96–106)
CO2: 25 mmol/L (ref 20–29)
Calcium: 9.3 mg/dL (ref 8.6–10.2)
Creatinine, Ser: 1.08 mg/dL (ref 0.76–1.27)
GFR calc Af Amer: 83 mL/min/{1.73_m2} (ref >59)
GFR calc non Af Amer: 72 mL/min/{1.73_m2} (ref >59)
GLOBULIN, TOTAL: 3.1 g/dL (ref 1.5–4.5)
GLUCOSE: 140 mg/dL — AB (ref 65–99)
Potassium: 4.6 mmol/L (ref 3.5–5.2)
SODIUM: 144 mmol/L (ref 134–144)
Total Protein: 7.2 g/dL (ref 6.0–8.5)

## 2016-07-12 LAB — CBC WITH DIFFERENTIAL/PLATELET
BASOS ABS: 0 10*3/uL (ref 0.0–0.2)
Basos: 0 %
EOS (ABSOLUTE): 0.5 10*3/uL — ABNORMAL HIGH (ref 0.0–0.4)
Eos: 6 %
HEMOGLOBIN: 14.8 g/dL (ref 13.0–17.7)
Hematocrit: 45 % (ref 37.5–51.0)
Immature Grans (Abs): 0 10*3/uL (ref 0.0–0.1)
Immature Granulocytes: 0 %
LYMPHS ABS: 3.2 10*3/uL — AB (ref 0.7–3.1)
Lymphs: 41 %
MCH: 27.3 pg (ref 26.6–33.0)
MCHC: 32.9 g/dL (ref 31.5–35.7)
MCV: 83 fL (ref 79–97)
MONOCYTES: 8 %
MONOS ABS: 0.7 10*3/uL (ref 0.1–0.9)
NEUTROS ABS: 3.6 10*3/uL (ref 1.4–7.0)
Neutrophils: 45 %
Platelets: 269 10*3/uL (ref 150–379)
RBC: 5.43 x10E6/uL (ref 4.14–5.80)
RDW: 14.4 % (ref 12.3–15.4)
WBC: 8 10*3/uL (ref 3.4–10.8)

## 2016-07-12 LAB — TSH: TSH: 4.4 u[IU]/mL (ref 0.450–4.500)

## 2016-07-12 MED ORDER — SUMATRIPTAN SUCCINATE 100 MG PO TABS
ORAL_TABLET | ORAL | 6 refills | Status: DC
Start: 2016-07-12 — End: 2016-11-22

## 2016-07-12 NOTE — Patient Instructions (Signed)
Great to see yoU!  You will hear from [place] for a sports medicine referral.   Come back in 3 month sunless you need [place] sooner.

## 2016-07-12 NOTE — Progress Notes (Signed)
   HPI  Patient presents today here for hip pain and chronic medical problems.  Diabetes Patient has reduced metformin to 1 pill once daily, he is watching his diet very closely. Not checking blood sugars. Asymptomatic.  Migraines Stable Needs refill of sumatriptan.  Hip pain Bilateral lateral hip pain that's been going on for about 6 months and seem to be getting worse. No overt injury. Patient is managed by pain management long-term methadone, they have not made any steps for diagnosis. Pain worsened with external rotation and abduction of the hips bilaterally  PMH: Smoking status noted ROS: Per HPI  Objective: BP 132/68   Pulse 73   Temp 97.8 F (36.6 C) (Oral)   Ht 5\' 11"  (1.803 m)   Wt 248 lb 6.4 oz (112.7 kg)   BMI 34.64 kg/m  Gen: NAD, alert, cooperative with exam HEENT: NCAT CV: RRR, good S1/S2, no murmur Resp: CTABL, no wheezes, non-labored Ext: No edema, warm Neuro: Alert and oriented, No gross deficits  Assessment and plan:  # Type 2 diabetes A1c slightly elevated at 7.2, ideally he would be less than 7. Continue current regimen of 500 mg metformin once daily, I have given him the option to go back up to twice daily or continue to watch his diet even closer.   # Bilateral hip pain Lateral hip pain bilaterally, no injury, plain film today. Refer to sports medicine for their device  # Chronic migraine Refill sumatriptan, stable    Orders Placed This Encounter  Procedures  . DG HIPS BILAT W OR W/O PELVIS 2V    Standing Status:   Future    Standing Expiration Date:   07/12/2017    Order Specific Question:   Reason for Exam (SYMPTOM  OR DIAGNOSIS REQUIRED)    Answer:   BL ;lateral hip pain    Order Specific Question:   Preferred imaging location?    Answer:   Internal  . Microalbumin / creatinine urine ratio  . Ambulatory referral to Sports Medicine    Referral Priority:   Routine    Referral Type:   Consultation    Referred to Provider:   Gerda Diss, DO    Number of Visits Requested:   1    Meds ordered this encounter  Medications  . SUMAtriptan (IMITREX) 100 MG tablet    Sig: TAKE 1 TABLET EVERY 2 HOURS AS NEEDED FOR MIGRAINE-MAY REPEAT IN 2 HOURS IF HEADACHE PERSISTS    Dispense:  18 tablet    Refill:  6    Laroy Apple, MD [facility] 07/12/2016, 8:40 AM

## 2016-07-13 ENCOUNTER — Encounter: Payer: Self-pay | Admitting: Family Medicine

## 2016-07-13 LAB — MICROALBUMIN / CREATININE URINE RATIO
CREATININE, UR: 158.3 mg/dL
MICROALBUM., U, RANDOM: 11.3 ug/mL
Microalb/Creat Ratio: 7.1 mg/g creat (ref 0.0–30.0)

## 2016-07-25 ENCOUNTER — Encounter: Payer: Self-pay | Admitting: Sports Medicine

## 2016-07-25 ENCOUNTER — Ambulatory Visit (INDEPENDENT_AMBULATORY_CARE_PROVIDER_SITE_OTHER): Payer: Medicare Other | Admitting: Sports Medicine

## 2016-07-25 VITALS — BP 142/78 | HR 65 | Ht 71.0 in | Wt 251.4 lb

## 2016-07-25 DIAGNOSIS — I10 Essential (primary) hypertension: Secondary | ICD-10-CM

## 2016-07-25 DIAGNOSIS — M25551 Pain in right hip: Secondary | ICD-10-CM | POA: Diagnosis not present

## 2016-07-25 DIAGNOSIS — G8929 Other chronic pain: Secondary | ICD-10-CM

## 2016-07-25 DIAGNOSIS — M47817 Spondylosis without myelopathy or radiculopathy, lumbosacral region: Secondary | ICD-10-CM | POA: Diagnosis not present

## 2016-07-25 DIAGNOSIS — E1142 Type 2 diabetes mellitus with diabetic polyneuropathy: Secondary | ICD-10-CM

## 2016-07-25 DIAGNOSIS — M545 Low back pain, unspecified: Secondary | ICD-10-CM

## 2016-07-25 DIAGNOSIS — M25552 Pain in left hip: Secondary | ICD-10-CM | POA: Diagnosis not present

## 2016-07-25 MED ORDER — AMITRIPTYLINE HCL 25 MG PO TABS: 25.0000 mg | ORAL_TABLET | Freq: Every day | ORAL | 3 refills | Status: DC

## 2016-07-25 NOTE — Progress Notes (Signed)
OFFICE VISIT NOTE Stephen Lawson. Stephen Lawson, [place] [facility] [place] - 66 y.o. male MRN [id number]  Date of birth: 17-Sep-1950  Visit Date: 07/25/2016  PCP: Timmothy Euler, MD   Referred by: Timmothy Euler, MD  Burlene Arnt, CMA acting as scribe for Dr. Paulla Fore.  SUBJECTIVE:   Chief Complaint  Patient presents with  . New Patient (Initial Visit)    bilateral hip pain   HPI: As below and per problem based documentation when appropriate.  Pt presents today with complaint of bilateral hip pain. Pain seems to be mostly on the lateral aspect of the hip. Xray was done 07/12/2016 and showed the following: FINDINGS: Mild joint space narrowing and spurring in the hip joints bilaterally. SI joints are symmetric and unremarkable. No acute bony abnormality. Specifically, no fracture, subluxation, or dislocation. Soft tissues are intact. IMPRESSION: Mild symmetric degenerative changes in the hips.  No acute findings.  Pain has been present x 6 months. No known injury or trauma. He has spinal fusion in 2009 but prior to that he was in to weight lifting, he did this from age [age].   The pain is described as sharp stabbing pain that causes him to almost fall at times. Pain is rated as 5/10 but 10/10 when stabbing pain is present.  Worsened with external rotation and abduction. Pain is worse when bearing weight, walking.  Improves with rest.  Therapies tried include: Pt is seen by pain management specialist and takes Methadone. He has not tried using ice or heat on the area.   Other associated symptoms include: Pt has hx of spinal fusion (Dr. Vertell Limber), back pain, arthritis, and bilateral knee pain. Pain radiates toward the groin at times. He denies radiating pain into the legs.   Pt denies night sweats, unintentional weight gain or weight loss.     Review of Systems  Constitutional: Negative for chills and fever.    Respiratory: Negative for shortness of breath and wheezing.   Cardiovascular: Negative for chest pain, palpitations and leg swelling.  Musculoskeletal: Positive for back pain and joint pain. Negative for falls.  Neurological: Positive for tingling (DM II, tingling in feet/toes) and headaches (migraines since age 23). Negative for dizziness.  Endo/Heme/Allergies: Negative for environmental allergies. Does not bruise/bleed easily.    Otherwise per HPI.  HISTORY & PERTINENT PRIOR DATA:  No specialty comments available. He reports that he quit smoking about 47 years ago. His smoking use included Cigarettes. He started smoking about 49 years ago. He smoked 0.50 packs per day. He uses smokeless tobacco. No results for input(s): HGBA1C, LABURIC in the last 8760 hours. Medications & Allergies reviewed per EMR Patient Active Problem List   Diagnosis Date Noted  . T2DM (type 2 diabetes mellitus) ([place]) 10/02/2015  . HTN (hypertension) 10/02/2015  . Chronic back pain 06/29/2015  . Allergic rhinitis 02/25/2013  . Shortness of breath 01/22/2013  . Dysphagia, unspecified(787.20) 01/22/2013  . Fatty liver 12/06/2012  . H/O asbestos exposure 12/06/2012  . Edema 12/06/2012   Past Medical History:  Diagnosis Date  . Back pain   . Hypertension    Family History  Problem Relation Age of Onset  . Heart disease Mother   . Heart disease Father   . Diabetes Brother    Past Surgical History:  Procedure Laterality Date  . BACK SURGERY    . ELBOW SURGERY    . SHOULDER SURGERY    .  WRIST SURGERY     Social History   Occupational History  . Not on file.   Social History Main Topics  . Smoking status: Former Smoker    Packs/day: 0.50    Types: Cigarettes    Start date: 01/04/1967    Quit date: 01/03/1969  . Smokeless tobacco: Current User  . Alcohol use No  . Drug use: No  . Sexual activity: Not on file    OBJECTIVE:  VS:  HT:5\' 11"  (180.3 cm)   WT:251 lb 6.4 oz (114 kg)  BMI:35.1     BP:(!) 142/78  HR:65bpm  TEMP: ( )  RESP:95 % EXAM: Findings:  WDWN, NAD, Non-toxic appearing Alert & appropriately interactive Not depressed or anxious appearing No increased work of breathing. Pupils are equal. EOM intact without nystagmus No clubbing or cyanosis of the extremities appreciated No significant rashes/lesions/ulcerations overlying the examined area. DP & PT pulses 2+/4.  Only small amount of pretibial edema bilaterally.  No clubbing or cyanosis Sensation intact to light touch in lower extremities.  Back & Lower Extremities: Markedly tight hamstrings bilaterally but no radicular symptoms or pain radiating to the low back. Generalized paraspinal muscle spasms in the bilateral low back with no significant midline tenderness..   Bilateral hip girdles and bilateral medius and maximus musculature.  No significant pain over the greater trochanter. Good internal and external rotation of the hips which is pain-free.  No significant crepitation of pain with axial loading and circumduction.  Manual muscle testing is 5+/5 in BLE myotomes without focality Lower extremity DTRs 2+/4 diffusely and symmetric He has no significant pain with Bosie Clos testing, Corky Sox testing or logroll.  Only minimal pain with terminal FADIR and this is symmetric bilaterally. Hip flexor range of motion is markedly limited with a functional limitation at approximately 60, physiologically I am only able to get him to neutral.  He is overall great strength with hip abduction is slightly hip flexed position but wants to get him to neutral he is unable to resist even minimal resistance indicating significant glute medius weakness.      Dg Hips Bilat W Or W/o Pelvis 2v  Result Date: 07/12/2016 CLINICAL DATA:  Bilateral hip pain. No known in CT abdomen and pelvis 06/08/2010 EXAM: DG HIP (WITH OR WITHOUT PELVIS) 2V BILAT COMPARISON:  None. FINDINGS: Mild joint space narrowing and spurring in the hip joints  bilaterally. SI joints are symmetric and unremarkable. No acute bony abnormality. Specifically, no fracture, subluxation, or dislocation. Soft tissues are intact. IMPRESSION: Mild symmetric degenerative changes in the hips.  No acute findings. Electronically Signed   By: Rolm Baptise M.D.   On: 07/12/2016 09:01   ASSESSMENT & PLAN:     ICD-10-CM   1. Pain of both hip joints M25.551    M25.552   2. Spondylosis of lumbosacral region without myelopathy or radiculopathy M47.817 amitriptyline (ELAVIL) 25 MG tablet  3. Type 2 diabetes mellitus with diabetic polyneuropathy, without long-term current use of insulin (HCC) E11.42   4. Diabetic polyneuropathy associated with type 2 diabetes mellitus (HCC) E11.42   5. Essential hypertension I10   6. Lumbar back pain M54.5 amitriptyline (ELAVIL) 25 MG tablet  7. Chronic midline low back pain without sciatica M54.5    G89.29   ================================================================= Chronic back pain Patient has a long-standing history of low back pain setting chronic pain syndrome.  Ultimately the hip girdle pain that he is experiencing is likely coming from his significant psoas contracture.  He has marked  limitations in a functional range of motion when looking at his ability to extend his hip.  He has overall good with motion of bilateral hips from an internal/external rotation strength with no significant evidence of advanced degenerative changes on x-ray.  Given the pain-free range of motion and mainly gluteal based pain with marked hip abductor weakness long time was spent today discussing the pathophysiology of low back pain as it relates to chronically tight hip flexors.  We discussed the options for referral to physical therapy but he would like to try home exercises and I have provided him to be he has try to perform on a daily basis.  Cautioned him on avoiding any type of activity that seems to exacerbate his pain.  We also discussed  neuromodulation with amitriptyline and he would like to try this at a low dose.  Given his other medical conditions he would like to avoid corticosteroids at this time which I agree with however if any lack of improvement can consider this and/or further diagnostic imaging/diagnostic/therapeutic epidural steroids.  Possibly facet blocks but his pain does seem to be more hip girdle related this time.  ================================================================= Follow-up: Return in about 4 weeks (around 08/22/2016).   CMA/ATC served as Education administrator during this visit. History, Physical, and Plan performed by medical provider. Documentation and orders reviewed and attested to.       Teresa Coombs, [facility] Physician

## 2016-07-25 NOTE — Patient Instructions (Addendum)
Also check out the YouTube Video from Dr. Eric Goodman.  I would like to see you try performing this 5-6 days per week.    A good intro video is: "Independence from Pain 7-minute Video" - https://www.youtube.com/watch?[email or web address]   His more advanced video is: "Powerful Posture and Pain Relief: 12 minutes of Foundation Training" - https://youtu.be/[email or web address]   Do not try to attempt this entire video when first beginning.    Try breaking of each exercise that he goes into shorter segments.  Otherwise if they perform an exercise for 45 seconds, start with 15 seconds and rest and then resume with a begin the new activity.  Work your way up to doing this 12 minute video and I expect to see significant improvements in your pain.  

## 2016-07-28 NOTE — Assessment & Plan Note (Signed)
Patient has a long-standing history of low back pain setting chronic pain syndrome.  Ultimately the hip girdle pain that he is experiencing is likely coming from his significant psoas contracture.  He has marked limitations in a functional range of motion when looking at his ability to extend his hip.  He has overall good with motion of bilateral hips from an internal/external rotation strength with no significant evidence of advanced degenerative changes on x-ray.  Given the pain-free range of motion and mainly gluteal based pain with marked hip abductor weakness long time was spent today discussing the pathophysiology of low back pain as it relates to chronically tight hip flexors.  We discussed the options for referral to physical therapy but he would like to try home exercises and I have provided him to be he has try to perform on a daily basis.  Cautioned him on avoiding any type of activity that seems to exacerbate his pain.  We also discussed neuromodulation with amitriptyline and he would like to try this at a low dose.  Given his other medical conditions he would like to avoid corticosteroids at this time which I agree with however if any lack of improvement can consider this and/or further diagnostic imaging/diagnostic/therapeutic epidural steroids.  Possibly facet blocks but his pain does seem to be more hip girdle related this time.

## 2016-08-22 ENCOUNTER — Ambulatory Visit: Payer: Medicare Other | Admitting: Sports Medicine

## 2016-09-10 ENCOUNTER — Other Ambulatory Visit: Payer: Self-pay | Admitting: Family Medicine

## 2016-09-24 ENCOUNTER — Other Ambulatory Visit: Payer: Self-pay | Admitting: Family Medicine

## 2016-10-04 ENCOUNTER — Other Ambulatory Visit: Payer: Self-pay | Admitting: Family Medicine

## 2016-10-04 DIAGNOSIS — I10 Essential (primary) hypertension: Secondary | ICD-10-CM

## 2016-10-04 MED ORDER — ATENOLOL 100 MG PO TABS: 100.0000 mg | ORAL_TABLET | Freq: Every day | ORAL | 1 refills | Status: DC

## 2016-10-04 NOTE — Telephone Encounter (Signed)
Medication refilled, patient aware 

## 2016-10-17 ENCOUNTER — Ambulatory Visit: Payer: Medicare Other | Admitting: Family Medicine

## 2016-11-18 ENCOUNTER — Other Ambulatory Visit: Payer: Medicare Other

## 2016-11-18 DIAGNOSIS — I10 Essential (primary) hypertension: Secondary | ICD-10-CM | POA: Diagnosis not present

## 2016-11-18 DIAGNOSIS — E1142 Type 2 diabetes mellitus with diabetic polyneuropathy: Secondary | ICD-10-CM

## 2016-11-18 LAB — BAYER DCA HB A1C WAIVED: HB A1C (BAYER DCA - WAIVED): 6.9 % (ref <7.0)

## 2016-11-19 LAB — CMP14+EGFR
A/G RATIO: 1.8 (ref 1.2–2.2)
ALBUMIN: 4.2 g/dL (ref 3.6–4.8)
ALT: 23 IU/L (ref 0–44)
AST: 21 IU/L (ref 0–40)
Alkaline Phosphatase: 84 IU/L (ref 39–117)
BUN/Creatinine Ratio: 18 (ref 10–24)
BUN: 18 mg/dL (ref 8–27)
Bilirubin Total: <0.2 mg/dL (ref 0.0–1.2)
CALCIUM: 8.7 mg/dL (ref 8.6–10.2)
CO2: 26 mmol/L (ref 20–29)
CREATININE: 0.98 mg/dL (ref 0.76–1.27)
Chloride: 104 mmol/L (ref 96–106)
GFR, EST AFRICAN AMERICAN: 93 mL/min/{1.73_m2} (ref >59)
GFR, EST NON AFRICAN AMERICAN: 81 mL/min/{1.73_m2} (ref >59)
GLOBULIN, TOTAL: 2.4 g/dL (ref 1.5–4.5)
GLUCOSE: 132 mg/dL — AB (ref 65–99)
Potassium: 4.5 mmol/L (ref 3.5–5.2)
SODIUM: 144 mmol/L (ref 134–144)
TOTAL PROTEIN: 6.6 g/dL (ref 6.0–8.5)

## 2016-11-19 LAB — CBC WITH DIFFERENTIAL/PLATELET
BASOS ABS: 0 10*3/uL (ref 0.0–0.2)
Basos: 1 %
EOS (ABSOLUTE): 0.4 10*3/uL (ref 0.0–0.4)
Eos: 6 %
Hematocrit: 44.6 % (ref 37.5–51.0)
Hemoglobin: 14.3 g/dL (ref 13.0–17.7)
IMMATURE GRANS (ABS): 0 10*3/uL (ref 0.0–0.1)
IMMATURE GRANULOCYTES: 0 %
LYMPHS: 41 %
Lymphocytes Absolute: 2.8 10*3/uL (ref 0.7–3.1)
MCH: 27.7 pg (ref 26.6–33.0)
MCHC: 32.1 g/dL (ref 31.5–35.7)
MCV: 86 fL (ref 79–97)
MONOS ABS: 0.4 10*3/uL (ref 0.1–0.9)
Monocytes: 6 %
NEUTROS PCT: 46 %
Neutrophils Absolute: 3.3 10*3/uL (ref 1.4–7.0)
PLATELETS: 243 10*3/uL (ref 150–379)
RBC: 5.17 x10E6/uL (ref 4.14–5.80)
RDW: 14.4 % (ref 12.3–15.4)
WBC: 7 10*3/uL (ref 3.4–10.8)

## 2016-11-19 LAB — TSH: TSH: 5.11 u[IU]/mL — AB (ref 0.450–4.500)

## 2016-11-21 ENCOUNTER — Ambulatory Visit: Payer: Medicare Other | Admitting: Family Medicine

## 2016-11-22 ENCOUNTER — Encounter: Payer: Self-pay | Admitting: Family Medicine

## 2016-11-22 ENCOUNTER — Ambulatory Visit (INDEPENDENT_AMBULATORY_CARE_PROVIDER_SITE_OTHER): Payer: Medicare Other | Admitting: Family Medicine

## 2016-11-22 VITALS — BP 141/74 | HR 67 | Temp 97.3°F | Ht 71.0 in | Wt 268.2 lb

## 2016-11-22 DIAGNOSIS — G43909 Migraine, unspecified, not intractable, without status migrainosus: Secondary | ICD-10-CM | POA: Insufficient documentation

## 2016-11-22 DIAGNOSIS — E039 Hypothyroidism, unspecified: Secondary | ICD-10-CM | POA: Insufficient documentation

## 2016-11-22 DIAGNOSIS — G43709 Chronic migraine without aura, not intractable, without status migrainosus: Secondary | ICD-10-CM | POA: Diagnosis not present

## 2016-11-22 DIAGNOSIS — E1142 Type 2 diabetes mellitus with diabetic polyneuropathy: Secondary | ICD-10-CM | POA: Diagnosis not present

## 2016-11-22 MED ORDER — LEVOTHYROXINE SODIUM 100 MCG PO TABS: 100.0000 ug | ORAL_TABLET | Freq: Every day | ORAL | 3 refills | Status: DC

## 2016-11-22 MED ORDER — SUMATRIPTAN SUCCINATE 100 MG PO TABS: ORAL_TABLET | ORAL | 11 refills | Status: DC

## 2016-11-22 NOTE — Progress Notes (Signed)
   HPI  Patient presents today here to follow-up for chronic medical conditions.  Diabetes Good medication compliance and tolerance.  Hypothyroidism Asymptomatic, good medication compliance.  Migraine headaches.  Patient states he has approximately 20-25 headaches a month, he is a little bit upset and requests sumatriptan enough to cover his headaches. He states that he previously saw a neurologist for 15 years who stated that he had tried every prophylactic medication available.   PMH: Smoking status noted ROS: Per HPI  Objective: BP (!) 141/74   Pulse 67   Temp (!) 97.3 F (36.3 C) (Oral)   Ht 5\' 11"  (1.803 m)   Wt 268 lb 3.2 oz (121.7 kg)   BMI 37.41 kg/m  Gen: NAD, alert, cooperative with exam HEENT: NCAT CV: RRR, good S1/S2, no murmur Resp: CTABL, no wheezes, non-labored Abd: SNTND, BS present, no guarding or organomegaly Ext: No edema, warm Neuro: Alert and oriented, No gross deficits  Diabetic Foot Exam - Simple   Simple Foot Form Diabetic Foot exam was performed with the following findings:  Yes 11/22/2016  1:32 PM  Visual Inspection No deformities, no ulcerations, no other skin breakdown bilaterally:  Yes Sensation Testing Intact to touch and monofilament testing bilaterally:  Yes Pulse Check Posterior Tibialis and Dorsalis pulse intact bilaterally:  Yes Comments      Assessment and plan:  #Migraine headaches Uncontrolled, patient states that he has tried everything available for prophylactic therapy Has good benefit with sumatriptan, request 30 pills a month, this has been given, we did discuss side effects of using long-term high-dose sumatriptan  #Type 2 diabetes Controlled No changes  #Hypothyroidism TSH slightly elevated, titrate to 100 mcg, repeat TSH next visit 3 months   Meds ordered this encounter  Medications  . levothyroxine (SYNTHROID, LEVOTHROID) 100 MCG tablet    Sig: Take 1 tablet (100 mcg total) by mouth daily.    Dispense:   90 tablet    Refill:  3  . SUMAtriptan (IMITREX) 100 MG tablet    Sig: TAKE 1 TABLET EVERY 2 HOURS AS NEEDED FOR MIGRAINE-MAY REPEAT IN 2 HOURS IF HEADACHE PERSISTS    Dispense:  30 tablet    Refill:  [place], MD [facility] 11/22/2016, 1:32 PM

## 2016-11-22 NOTE — Patient Instructions (Signed)
Great to see you!  Come back in 3 months, I will plan to repeat your thyroid testing and follow up for diabetes.   Consider Aimovig or Ajovy for migraines

## 2017-01-25 ENCOUNTER — Telehealth: Payer: Self-pay | Admitting: Family Medicine

## 2017-01-25 DIAGNOSIS — E119 Type 2 diabetes mellitus without complications: Secondary | ICD-10-CM

## 2017-01-25 MED ORDER — METFORMIN HCL 500 MG PO TABS: ORAL_TABLET | ORAL | 1 refills | Status: DC

## 2017-01-25 NOTE — Telephone Encounter (Signed)
What is the name of the medication? metFORMIN (GLUCOPHAGE) 500 MG tablet   Have you contacted your pharmacy to request a refill? [facility]  Which pharmacy would you like this sent to? [facility]   Patient notified that their request is being sent to the clinical staff for review and that they should receive a call once it is complete. If they do not receive a call within 24 hours they can check with their pharmacy or our office.

## 2017-01-25 NOTE — Telephone Encounter (Signed)
Med sent to pharmacy per patients request

## 2017-02-17 ENCOUNTER — Other Ambulatory Visit: Payer: Medicare Other

## 2017-02-17 DIAGNOSIS — E039 Hypothyroidism, unspecified: Secondary | ICD-10-CM | POA: Diagnosis not present

## 2017-02-17 DIAGNOSIS — I10 Essential (primary) hypertension: Secondary | ICD-10-CM

## 2017-02-17 DIAGNOSIS — E119 Type 2 diabetes mellitus without complications: Secondary | ICD-10-CM | POA: Diagnosis not present

## 2017-02-17 LAB — BAYER DCA HB A1C WAIVED: HB A1C (BAYER DCA - WAIVED): 6.2 % (ref <7.0)

## 2017-02-18 LAB — CBC WITH DIFFERENTIAL/PLATELET
BASOS: 0 %
Basophils Absolute: 0 10*3/uL (ref 0.0–0.2)
EOS (ABSOLUTE): 0.2 10*3/uL (ref 0.0–0.4)
EOS: 2 %
HEMATOCRIT: 43.2 % (ref 37.5–51.0)
Hemoglobin: 14.1 g/dL (ref 13.0–17.7)
IMMATURE GRANULOCYTES: 0 %
Immature Grans (Abs): 0 10*3/uL (ref 0.0–0.1)
LYMPHS ABS: 2.6 10*3/uL (ref 0.7–3.1)
Lymphs: 34 %
MCH: 26.4 pg — AB (ref 26.6–33.0)
MCHC: 32.6 g/dL (ref 31.5–35.7)
MCV: 81 fL (ref 79–97)
MONOS ABS: 0.7 10*3/uL (ref 0.1–0.9)
Monocytes: 9 %
NEUTROS PCT: 55 %
Neutrophils Absolute: 4.3 10*3/uL (ref 1.4–7.0)
PLATELETS: 294 10*3/uL (ref 150–379)
RBC: 5.35 x10E6/uL (ref 4.14–5.80)
RDW: 14.6 % (ref 12.3–15.4)
WBC: 7.8 10*3/uL (ref 3.4–10.8)

## 2017-02-18 LAB — LIPID PANEL
CHOL/HDL RATIO: 3.4 ratio (ref 0.0–5.0)
Cholesterol, Total: 109 mg/dL (ref 100–199)
HDL: 32 mg/dL — ABNORMAL LOW (ref >39)
LDL CALC: 66 mg/dL (ref 0–99)
TRIGLYCERIDES: 54 mg/dL (ref 0–149)
VLDL Cholesterol Cal: 11 mg/dL (ref 5–40)

## 2017-02-18 LAB — CMP14+EGFR
A/G RATIO: 1.7 (ref 1.2–2.2)
ALK PHOS: 82 IU/L (ref 39–117)
ALT: 23 IU/L (ref 0–44)
AST: 18 IU/L (ref 0–40)
Albumin: 4.3 g/dL (ref 3.6–4.8)
BUN / CREAT RATIO: 16 (ref 10–24)
BUN: 16 mg/dL (ref 8–27)
Bilirubin Total: 0.3 mg/dL (ref 0.0–1.2)
CALCIUM: 9.1 mg/dL (ref 8.6–10.2)
CO2: 28 mmol/L (ref 20–29)
CREATININE: 1.02 mg/dL (ref 0.76–1.27)
Chloride: 99 mmol/L (ref 96–106)
GFR calc Af Amer: 89 mL/min/{1.73_m2} (ref >59)
GFR, EST NON AFRICAN AMERICAN: 77 mL/min/{1.73_m2} (ref >59)
GLOBULIN, TOTAL: 2.6 g/dL (ref 1.5–4.5)
Glucose: 108 mg/dL — ABNORMAL HIGH (ref 65–99)
POTASSIUM: 4.3 mmol/L (ref 3.5–5.2)
SODIUM: 143 mmol/L (ref 134–144)
Total Protein: 6.9 g/dL (ref 6.0–8.5)

## 2017-02-18 LAB — TSH: TSH: 4.18 u[IU]/mL (ref 0.450–4.500)

## 2017-02-22 ENCOUNTER — Ambulatory Visit: Payer: Medicare Other | Admitting: Family Medicine

## 2017-02-23 ENCOUNTER — Encounter: Payer: Self-pay | Admitting: Family Medicine

## 2017-03-28 ENCOUNTER — Other Ambulatory Visit: Payer: Self-pay | Admitting: *Deleted

## 2017-03-28 DIAGNOSIS — I10 Essential (primary) hypertension: Secondary | ICD-10-CM

## 2017-03-28 MED ORDER — ATENOLOL 100 MG PO TABS: 100.0000 mg | ORAL_TABLET | Freq: Every day | ORAL | 0 refills | Status: DC

## 2017-05-11 DIAGNOSIS — H6123 Impacted cerumen, bilateral: Secondary | ICD-10-CM | POA: Insufficient documentation

## 2017-05-11 DIAGNOSIS — H6122 Impacted cerumen, left ear: Secondary | ICD-10-CM | POA: Diagnosis not present

## 2017-05-14 DIAGNOSIS — R0602 Shortness of breath: Secondary | ICD-10-CM | POA: Diagnosis not present

## 2017-05-14 DIAGNOSIS — R05 Cough: Secondary | ICD-10-CM | POA: Diagnosis not present

## 2017-05-14 DIAGNOSIS — R0609 Other forms of dyspnea: Secondary | ICD-10-CM | POA: Diagnosis not present

## 2017-06-02 ENCOUNTER — Ambulatory Visit: Payer: Medicare Other | Admitting: Family Medicine

## 2017-06-15 ENCOUNTER — Ambulatory Visit (INDEPENDENT_AMBULATORY_CARE_PROVIDER_SITE_OTHER): Payer: Medicare Other | Admitting: Family Medicine

## 2017-06-15 ENCOUNTER — Encounter: Payer: Self-pay | Admitting: Family Medicine

## 2017-06-15 VITALS — BP 150/72 | HR 65 | Temp 97.8°F | Ht 71.0 in | Wt 244.0 lb

## 2017-06-15 DIAGNOSIS — G43709 Chronic migraine without aura, not intractable, without status migrainosus: Secondary | ICD-10-CM

## 2017-06-15 DIAGNOSIS — I10 Essential (primary) hypertension: Secondary | ICD-10-CM

## 2017-06-15 DIAGNOSIS — E1142 Type 2 diabetes mellitus with diabetic polyneuropathy: Secondary | ICD-10-CM | POA: Diagnosis not present

## 2017-06-15 LAB — BAYER DCA HB A1C WAIVED: HB A1C (BAYER DCA - WAIVED): 6.4 % (ref <7.0)

## 2017-06-15 MED ORDER — LEVOTHYROXINE SODIUM 100 MCG PO TABS: 100.0000 ug | ORAL_TABLET | Freq: Every day | ORAL | 3 refills | Status: DC

## 2017-06-15 MED ORDER — METFORMIN HCL 500 MG PO TABS: ORAL_TABLET | ORAL | 1 refills | Status: DC

## 2017-06-15 MED ORDER — ATENOLOL 100 MG PO TABS: 100.0000 mg | ORAL_TABLET | Freq: Every day | ORAL | 3 refills | Status: DC

## 2017-06-15 MED ORDER — SUMATRIPTAN SUCCINATE 100 MG PO TABS: ORAL_TABLET | ORAL | 11 refills | Status: DC

## 2017-06-15 NOTE — Patient Instructions (Signed)
Great to see you!   

## 2017-06-15 NOTE — Progress Notes (Signed)
   HPI  Patient presents today for follow-up chronic medical conditions.  Type 2 diabetes Good medication compliance and tolerance Watching diet very closely.  Hypertension Usually well controlled at doctor's office visits, good medication compliance No headache or chest pain. No recently missed pills.  PMH: Smoking status noted ROS: Per HPI  Objective: BP (!) 180/84   Pulse 65   Temp 97.8 F (36.6 C) (Oral)   Ht 5\' 11"  (1.803 m)   Wt 244 lb (110.7 kg)   BMI 34.03 kg/m  Gen: NAD, alert, cooperative with exam HEENT: NCAT CV: RRR, good S1/S2, no murmur Resp: CTABL, no wheezes, non-labored Ext: No edema, warm Neuro: Alert and oriented, No gross deficits  Assessment and plan:  #Hypertension Elevated today, usually well controlled No changes Improved after rechcek  #Type 2 diabetes Well-controlled No changes Continue to monitor A1c every 3 months  #Chronic migraine Stable, needs refill of Imitrex   Orders Placed This Encounter  Procedures  . Bayer DCA Hb A1c Waived    Meds ordered this encounter  Medications  . SUMAtriptan (IMITREX) 100 MG tablet    Sig: TAKE 1 TABLET EVERY 2 HOURS AS NEEDED FOR MIGRAINE-MAY REPEAT IN 2 HOURS IF HEADACHE PERSISTS    Dispense:  30 tablet    Refill:  11  . atenolol (TENORMIN) 100 MG tablet    Sig: Take 1 tablet (100 mg total) by mouth daily.    Dispense:  90 tablet    Refill:  3  . metFORMIN (GLUCOPHAGE) 500 MG tablet    Sig: TAKE 1 TABLET TWICE A DAY  WITH MEALS    Dispense:  180 tablet    Refill:  1  . levothyroxine (SYNTHROID, LEVOTHROID) 100 MCG tablet    Sig: Take 1 tablet (100 mcg total) by mouth daily.    Dispense:  90 tablet    Refill:  [place], MD [place] 06/15/2017, 9:03 AM

## 2017-09-19 ENCOUNTER — Ambulatory Visit: Payer: Medicare Other | Admitting: Family Medicine

## 2017-10-05 ENCOUNTER — Encounter: Payer: Self-pay | Admitting: Family Medicine

## 2017-10-05 ENCOUNTER — Ambulatory Visit (INDEPENDENT_AMBULATORY_CARE_PROVIDER_SITE_OTHER): Payer: Medicare Other | Admitting: Family Medicine

## 2017-10-05 VITALS — BP 151/75 | HR 67 | Temp 97.7°F | Ht 71.0 in | Wt 245.2 lb

## 2017-10-05 DIAGNOSIS — Z23 Encounter for immunization: Secondary | ICD-10-CM

## 2017-10-05 DIAGNOSIS — G43909 Migraine, unspecified, not intractable, without status migrainosus: Secondary | ICD-10-CM | POA: Diagnosis not present

## 2017-10-05 DIAGNOSIS — E1142 Type 2 diabetes mellitus with diabetic polyneuropathy: Secondary | ICD-10-CM

## 2017-10-05 DIAGNOSIS — E039 Hypothyroidism, unspecified: Secondary | ICD-10-CM

## 2017-10-05 DIAGNOSIS — I10 Essential (primary) hypertension: Secondary | ICD-10-CM

## 2017-10-05 LAB — BAYER DCA HB A1C WAIVED: HB A1C: 6.8 % (ref <7.0)

## 2017-10-05 MED ORDER — METFORMIN HCL 500 MG PO TABS: ORAL_TABLET | ORAL | 3 refills | Status: DC

## 2017-10-05 MED ORDER — LEVOTHYROXINE SODIUM 100 MCG PO TABS: 100.0000 ug | ORAL_TABLET | Freq: Every day | ORAL | 3 refills | Status: DC

## 2017-10-05 MED ORDER — ATENOLOL 100 MG PO TABS: 100.0000 mg | ORAL_TABLET | Freq: Every day | ORAL | 3 refills | Status: DC

## 2017-10-05 NOTE — Progress Notes (Signed)
BP (!) 151/75   Pulse 67   Temp 97.7 F (36.5 C) (Oral)   Ht _0  (1.803 m)   Wt 245 lb 3.2 oz (111.2 kg)   BMI 34.20 kg/m    Subjective:    Patient ID: Stephen Lawson, male    DOB: 1950-04-12, 67 y.o.   MRN: [id number]  HPI: Stephen Lawson is a 68 y.o. male presenting on 10/05/2017 for Diabetes (3 month follow up); Hypertension; Establish Care Wendi Snipes pt); and Migraine (Patient states that it has been ongoing but has been worse last year.)   HPI Type 2 diabetes mellitus Patient comes in today for recheck of his diabetes. Patient has been currently taking metformin. Patient is not currently on an ACE inhibitor/ARB. Patient has not seen an ophthalmologist this year. Patient denies any new issues with their feet.  Patient has polyneuropathy and says that has been managed  Hypertension Patient is currently on atenolol, and their blood pressure today is 151/75. Patient denies any lightheadedness or dizziness. Patient denies headaches, blurred vision, chest pains, shortness of breath, or weakness. Denies any side effects from medication and is content with current medication.   Hypothyroidism recheck Patient is coming in for thyroid recheck today as well. They deny any issues with hair changes or heat or cold problems or diarrhea or constipation. They deny any chest pain or palpitations. They are currently on levothyroxine 100 micrograms   Migraine headaches Patient gets migraine headaches but he currently uses sumatriptan for.  He uses sumatriptan at the beginning of his migraine it usually does not last more than 2 to 4 hours.  He typically gets migraines about twice a month and describes mostly as right-sided pounding and pulsating.  He denies any numbness or weakness or any irregularity in the migraine but are typically about the way that they have been.  He also denies any aura.  Relevant past medical, surgical, family and social history reviewed and updated as indicated. Interim  medical history since our last visit reviewed. Allergies and medications reviewed and updated.  Review of Systems  Constitutional: Negative for chills and fever.  Eyes: Negative for visual disturbance.  Respiratory: Negative for shortness of breath and wheezing.   Cardiovascular: Negative for chest pain and leg swelling.  Musculoskeletal: Negative for back pain and gait problem.  Skin: Negative for rash.  Neurological: Positive for headaches. Negative for dizziness, weakness, light-headedness and numbness.  All other systems reviewed and are negative.   Per HPI unless specifically indicated above   Allergies as of 10/05/2017      Reactions   Darvocet [propoxyphene N-acetaminophen]    Itching   Talwin [pentazocine]    itching   Ultram [tramadol]       Medication List        Accurate as of 10/05/17 10:32 AM. Always use your most recent med list.          atenolol 100 MG tablet Commonly known as:  TENORMIN Take 1 tablet (100 mg total) by mouth daily.   blood glucose meter kit and supplies Kit Dispense based on patient and insurance preference. Use up to four times daily as directed. (FOR ICD-9 250.00, 250.01).   clobetasol cream 0.05 % Commonly known as:  TEMOVATE Apply 1 application topically 2 (two) times daily.   diazepam 5 MG tablet Commonly known as:  VALIUM 5 mg every 12 (twelve) hours as needed.   levothyroxine 100 MCG tablet Commonly known as:  SYNTHROID, LEVOTHROID Take 1  tablet (100 mcg total) by mouth daily.   metFORMIN 500 MG tablet Commonly known as:  GLUCOPHAGE TAKE 1 TABLET TWICE A DAY  WITH MEALS   methadone 10 MG tablet Commonly known as:  DOLOPHINE Take 10 mg by mouth 2 (two) times daily.   SUMAtriptan 100 MG tablet Commonly known as:  IMITREX TAKE 1 TABLET EVERY 2 HOURS AS NEEDED FOR MIGRAINE-MAY REPEAT IN 2 HOURS IF HEADACHE PERSISTS          Objective:    BP (!) 151/75   Pulse 67   Temp 97.7 F (36.5 C) (Oral)   Ht _0   (1.803 m)   Wt 245 lb 3.2 oz (111.2 kg)   BMI 34.20 kg/m   Wt Readings from Last 3 Encounters:  10/05/17 245 lb 3.2 oz (111.2 kg)  06/15/17 244 lb (110.7 kg)  11/22/16 268 lb 3.2 oz (121.7 kg)    Physical Exam  Constitutional: He is oriented to person, place, and time. He appears well-developed and well-nourished. No distress.  Eyes: Pupils are equal, round, and reactive to light. Conjunctivae and EOM are normal. No scleral icterus.  Neck: Neck supple. No thyromegaly present.  Cardiovascular: Normal rate, regular rhythm, normal heart sounds and intact distal pulses.  No murmur heard. Pulmonary/Chest: Effort normal and breath sounds normal. No respiratory distress. He has no wheezes.  Musculoskeletal: Normal range of motion. He exhibits no edema.  Lymphadenopathy:    He has no cervical adenopathy.  Neurological: He is alert and oriented to person, place, and time. No cranial nerve deficit. Coordination normal.  Skin: Skin is warm and dry. No rash noted. He is not diaphoretic.  Psychiatric: He has a normal mood and affect. His behavior is normal.  Nursing note and vitals reviewed.       Assessment & Plan:   Problem List Items Addressed This Visit      Cardiovascular and Mediastinum   HTN (hypertension)   Relevant Medications   atenolol (TENORMIN) 100 MG tablet   Other Relevant Orders   Microalbumin / creatinine urine ratio   CBC with Differential/Platelet (Completed)   CMP14+EGFR (Completed)   Lipid panel (Completed)   Migraine headache   Relevant Medications   atenolol (TENORMIN) 100 MG tablet     Endocrine   T2DM (type 2 diabetes mellitus) ([place]) - Primary   Relevant Medications   metFORMIN (GLUCOPHAGE) 500 MG tablet   Other Relevant Orders   Microalbumin / creatinine urine ratio   CBC with Differential/Platelet (Completed)   CMP14+EGFR (Completed)   Lipid panel (Completed)   Bayer DCA Hb A1c Waived (Completed)   Hypothyroid   Relevant Medications   atenolol  (TENORMIN) 100 MG tablet   levothyroxine (SYNTHROID, LEVOTHROID) 100 MCG tablet   Other Relevant Orders   CBC with Differential/Platelet (Completed)   TSH (Completed)       Follow up plan: Return in about 3 months (around 01/05/2018), or if symptoms worsen or fail to improve, for Type 2 diabetes hypertension migraines.  Counseling provided for all of the vaccine components Orders Placed This Encounter  Procedures  . Microalbumin / creatinine urine ratio  . CBC with Differential/Platelet  . CMP14+EGFR  . Lipid panel  . TSH  . Bayer [facility] [place] Hb A1c Lee, MD [facility] 10/05/2017, 10:32 AM

## 2017-10-06 LAB — CBC WITH DIFFERENTIAL/PLATELET
BASOS ABS: 0.1 10*3/uL (ref 0.0–0.2)
Basos: 1 %
EOS (ABSOLUTE): 0.4 10*3/uL (ref 0.0–0.4)
EOS: 5 %
HEMOGLOBIN: 14.7 g/dL (ref 13.0–17.7)
Hematocrit: 45 % (ref 37.5–51.0)
IMMATURE GRANULOCYTES: 0 %
Immature Grans (Abs): 0 10*3/uL (ref 0.0–0.1)
LYMPHS: 29 %
Lymphocytes Absolute: 2.3 10*3/uL (ref 0.7–3.1)
MCH: 27.1 pg (ref 26.6–33.0)
MCHC: 32.7 g/dL (ref 31.5–35.7)
MCV: 83 fL (ref 79–97)
MONOCYTES: 9 %
Monocytes Absolute: 0.7 10*3/uL (ref 0.1–0.9)
NEUTROS PCT: 56 %
Neutrophils Absolute: 4.4 10*3/uL (ref 1.4–7.0)
Platelets: 266 10*3/uL (ref 150–450)
RBC: 5.43 x10E6/uL (ref 4.14–5.80)
RDW: 13.4 % (ref 12.3–15.4)
WBC: 7.8 10*3/uL (ref 3.4–10.8)

## 2017-10-06 LAB — TSH: TSH: 5.26 u[IU]/mL — AB (ref 0.450–4.500)

## 2017-10-06 LAB — LIPID PANEL
CHOL/HDL RATIO: 3.8 ratio (ref 0.0–5.0)
Cholesterol, Total: 124 mg/dL (ref 100–199)
HDL: 33 mg/dL — AB (ref >39)
LDL Calculated: 76 mg/dL (ref 0–99)
Triglycerides: 77 mg/dL (ref 0–149)
VLDL CHOLESTEROL CAL: 15 mg/dL (ref 5–40)

## 2017-10-06 LAB — CMP14+EGFR
ALBUMIN: 4.5 g/dL (ref 3.6–4.8)
ALT: 23 IU/L (ref 0–44)
AST: 20 IU/L (ref 0–40)
Albumin/Globulin Ratio: 2 (ref 1.2–2.2)
Alkaline Phosphatase: 85 IU/L (ref 39–117)
BUN/Creatinine Ratio: 15 (ref 10–24)
BUN: 15 mg/dL (ref 8–27)
Bilirubin Total: 0.4 mg/dL (ref 0.0–1.2)
CALCIUM: 9.1 mg/dL (ref 8.6–10.2)
CO2: 24 mmol/L (ref 20–29)
CREATININE: 1.02 mg/dL (ref 0.76–1.27)
Chloride: 100 mmol/L (ref 96–106)
GFR calc Af Amer: 88 mL/min/{1.73_m2} (ref >59)
GFR, EST NON AFRICAN AMERICAN: 76 mL/min/{1.73_m2} (ref >59)
GLOBULIN, TOTAL: 2.3 g/dL (ref 1.5–4.5)
GLUCOSE: 149 mg/dL — AB (ref 65–99)
Potassium: 4.4 mmol/L (ref 3.5–5.2)
SODIUM: 142 mmol/L (ref 134–144)
Total Protein: 6.8 g/dL (ref 6.0–8.5)

## 2017-10-08 ENCOUNTER — Encounter: Payer: Self-pay | Admitting: Family Medicine

## 2018-01-08 ENCOUNTER — Other Ambulatory Visit: Payer: Medicare Other

## 2018-01-08 DIAGNOSIS — E039 Hypothyroidism, unspecified: Secondary | ICD-10-CM | POA: Diagnosis not present

## 2018-01-08 DIAGNOSIS — I10 Essential (primary) hypertension: Secondary | ICD-10-CM

## 2018-01-08 DIAGNOSIS — E1142 Type 2 diabetes mellitus with diabetic polyneuropathy: Secondary | ICD-10-CM | POA: Diagnosis not present

## 2018-01-08 LAB — BAYER DCA HB A1C WAIVED: HB A1C (BAYER DCA - WAIVED): 7.1 % — ABNORMAL HIGH (ref <7.0)

## 2018-01-09 ENCOUNTER — Encounter: Payer: Self-pay | Admitting: Family Medicine

## 2018-01-09 ENCOUNTER — Ambulatory Visit (INDEPENDENT_AMBULATORY_CARE_PROVIDER_SITE_OTHER): Payer: Medicare Other | Admitting: Family Medicine

## 2018-01-09 VITALS — BP 155/79 | HR 61 | Temp 97.3°F | Ht 71.0 in | Wt 245.2 lb

## 2018-01-09 DIAGNOSIS — E039 Hypothyroidism, unspecified: Secondary | ICD-10-CM | POA: Diagnosis not present

## 2018-01-09 DIAGNOSIS — G43909 Migraine, unspecified, not intractable, without status migrainosus: Secondary | ICD-10-CM

## 2018-01-09 DIAGNOSIS — I10 Essential (primary) hypertension: Secondary | ICD-10-CM

## 2018-01-09 DIAGNOSIS — E1142 Type 2 diabetes mellitus with diabetic polyneuropathy: Secondary | ICD-10-CM | POA: Diagnosis not present

## 2018-01-09 LAB — CBC WITH DIFFERENTIAL/PLATELET
Basophils Absolute: 0 10*3/uL (ref 0.0–0.2)
Basos: 1 %
EOS (ABSOLUTE): 0.3 10*3/uL (ref 0.0–0.4)
Eos: 4 %
Hematocrit: 41.2 % (ref 37.5–51.0)
Hemoglobin: 14 g/dL (ref 13.0–17.7)
Immature Grans (Abs): 0 10*3/uL (ref 0.0–0.1)
Immature Granulocytes: 0 %
Lymphocytes Absolute: 2.3 10*3/uL (ref 0.7–3.1)
Lymphs: 35 %
MCH: 27.5 pg (ref 26.6–33.0)
MCHC: 34 g/dL (ref 31.5–35.7)
MCV: 81 fL (ref 79–97)
Monocytes Absolute: 0.6 10*3/uL (ref 0.1–0.9)
Monocytes: 9 %
Neutrophils Absolute: 3.4 10*3/uL (ref 1.4–7.0)
Neutrophils: 51 %
Platelets: 230 10*3/uL (ref 150–450)
RBC: 5.09 x10E6/uL (ref 4.14–5.80)
RDW: 13.3 % (ref 11.6–15.4)
WBC: 6.5 10*3/uL (ref 3.4–10.8)

## 2018-01-09 LAB — LIPID PANEL
Chol/HDL Ratio: 3.6 ratio (ref 0.0–5.0)
Cholesterol, Total: 128 mg/dL (ref 100–199)
HDL: 36 mg/dL — ABNORMAL LOW (ref >39)
LDL CALC: 79 mg/dL (ref 0–99)
Triglycerides: 67 mg/dL (ref 0–149)
VLDL Cholesterol Cal: 13 mg/dL (ref 5–40)

## 2018-01-09 LAB — CMP14+EGFR
ALT: 24 IU/L (ref 0–44)
AST: 18 IU/L (ref 0–40)
Albumin/Globulin Ratio: 2 (ref 1.2–2.2)
Albumin: 4.4 g/dL (ref 3.6–4.8)
Alkaline Phosphatase: 83 IU/L (ref 39–117)
BUN/Creatinine Ratio: 13 (ref 10–24)
BUN: 13 mg/dL (ref 8–27)
Bilirubin Total: 0.3 mg/dL (ref 0.0–1.2)
CO2: 29 mmol/L (ref 20–29)
Calcium: 9.2 mg/dL (ref 8.6–10.2)
Chloride: 101 mmol/L (ref 96–106)
Creatinine, Ser: 1.03 mg/dL (ref 0.76–1.27)
GFR calc Af Amer: 87 mL/min/{1.73_m2} (ref >59)
GFR calc non Af Amer: 75 mL/min/{1.73_m2} (ref >59)
Globulin, Total: 2.2 g/dL (ref 1.5–4.5)
Glucose: 128 mg/dL — ABNORMAL HIGH (ref 65–99)
Potassium: 4.6 mmol/L (ref 3.5–5.2)
Sodium: 141 mmol/L (ref 134–144)
Total Protein: 6.6 g/dL (ref 6.0–8.5)

## 2018-01-09 LAB — TSH: TSH: 5.19 u[IU]/mL — ABNORMAL HIGH (ref 0.450–4.500)

## 2018-01-09 MED ORDER — LEVOTHYROXINE SODIUM 25 MCG PO TABS: 12.5000 ug | ORAL_TABLET | Freq: Every day | ORAL | 0 refills | Status: DC

## 2018-01-09 MED ORDER — AMLODIPINE BESYLATE 5 MG PO TABS: 5.0000 mg | ORAL_TABLET | Freq: Every day | ORAL | 3 refills | Status: DC

## 2018-01-09 NOTE — Progress Notes (Signed)
BP (!) 155/79   Pulse 61   Temp (!) 97.3 F (36.3 C) (Oral)   Ht _0  (1.803 m)   Wt 245 lb 3.2 oz (111.2 kg)   BMI 34.20 kg/m    Subjective:    Patient ID: Stephen Lawson, male    DOB: [birth year], 68 y.o.   MRN: [id number]  HPI: Stephen Lawson is a 68 y.o. male presenting on 01/09/2018 for Diabetes (53monthfollow up) and Hypertension   HPI Hypothyroidism recheck Patient is coming in for thyroid recheck today as well. They deny any issues with hair changes or heat or cold problems or diarrhea or constipation. They deny any chest pain or palpitations. They are currently on levothyroxine 100 micrograms.  He had blood work done just recently where his TSH was 5 we will increase his thyroid level  Hypertension Patient is currently on atenolol, and their blood pressure today is 155/79. Patient denies any lightheadedness or dizziness. Patient denies headaches, blurred vision, chest pains, shortness of breath, or weakness. Denies any side effects from medication and is content with current medication.   Chronic migraines Patient says is chronic migraines are mostly controlled, he needs refill for Imitrex  Relevant past medical, surgical, family and social history reviewed and updated as indicated. Interim medical history since our last visit reviewed. Allergies and medications reviewed and updated.  Review of Systems  Constitutional: Negative for chills and fever.  Eyes: Negative for visual disturbance.  Respiratory: Negative for shortness of breath and wheezing.   Cardiovascular: Negative for chest pain and leg swelling.  Musculoskeletal: Negative for back pain and gait problem.  Skin: Negative for rash.  Neurological: Negative for dizziness, weakness, light-headedness and numbness.  All other systems reviewed and are negative.   Per HPI unless specifically indicated above   Allergies as of 01/09/2018      Reactions   Darvocet [propoxyphene N-acetaminophen]    Itching   Talwin  [pentazocine]    itching   Ultram [tramadol]       Medication List       Accurate as of January 09, 2018  8:33 AM. Always use your most recent med list.        amLODipine 5 MG tablet Commonly known as:  NORVASC Take 1 tablet (5 mg total) by mouth daily.   atenolol 100 MG tablet Commonly known as:  TENORMIN Take 1 tablet (100 mg total) by mouth daily.   blood glucose meter kit and supplies Kit Dispense based on patient and insurance preference. Use up to four times daily as directed. (FOR ICD-9 250.00, 250.01).   clobetasol cream 0.05 % Commonly known as:  TEMOVATE Apply 1 application topically 2 (two) times daily.   diazepam 5 MG tablet Commonly known as:  VALIUM 5 mg every 12 (twelve) hours as needed.   levothyroxine 100 MCG tablet Commonly known as:  SYNTHROID, LEVOTHROID Take 1 tablet (100 mcg total) by mouth daily.   levothyroxine 25 MCG tablet Commonly known as:  SYNTHROID, LEVOTHROID Take 0.5 tablets (12.5 mcg total) by mouth daily before breakfast.   metFORMIN 500 MG tablet Commonly known as:  GLUCOPHAGE TAKE 1 TABLET TWICE A DAY  WITH MEALS   methadone 10 MG tablet Commonly known as:  DOLOPHINE Take 10 mg by mouth 2 (two) times daily.   SUMAtriptan 100 MG tablet Commonly known as:  IMITREX TAKE 1 TABLET EVERY 2 HOURS AS NEEDED FOR MIGRAINE-MAY REPEAT IN 2 HOURS IF HEADACHE PERSISTS  Objective:    BP (!) 155/79   Pulse 61   Temp (!) 97.3 F (36.3 C) (Oral)   Ht _0  (1.803 m)   Wt 245 lb 3.2 oz (111.2 kg)   BMI 34.20 kg/m   Wt Readings from Last 3 Encounters:  01/09/18 245 lb 3.2 oz (111.2 kg)  10/05/17 245 lb 3.2 oz (111.2 kg)  06/15/17 244 lb (110.7 kg)    Physical Exam Vitals signs and nursing note reviewed.  Constitutional:      General: He is not in acute distress.    Appearance: He is well-developed. He is not diaphoretic.  Eyes:     General: No scleral icterus.    Conjunctiva/sclera: Conjunctivae normal.  Neck:      Musculoskeletal: Neck supple.     Thyroid: No thyromegaly.  Cardiovascular:     Rate and Rhythm: Normal rate and regular rhythm.     Heart sounds: Normal heart sounds. No murmur.  Pulmonary:     Effort: Pulmonary effort is normal. No respiratory distress.     Breath sounds: Normal breath sounds. No wheezing.  Musculoskeletal: Normal range of motion.  Lymphadenopathy:     Cervical: No cervical adenopathy.  Skin:    General: Skin is warm and dry.     Findings: No rash.  Neurological:     Mental Status: He is alert and oriented to person, place, and time.     Coordination: Coordination normal.  Psychiatric:        Behavior: Behavior normal.        Assessment & Plan:   Problem List Items Addressed This Visit      Cardiovascular and Mediastinum   HTN (hypertension)   Relevant Medications   amLODipine (NORVASC) 5 MG tablet   Migraine headache   Relevant Medications   amLODipine (NORVASC) 5 MG tablet     Endocrine   T2DM (type 2 diabetes mellitus) ([facility]) - Primary   Relevant Orders   Bayer DCA Hb A1c Waived   Hypothyroid   Relevant Medications   levothyroxine (SYNTHROID, LEVOTHROID) 25 MCG tablet   Other Relevant Orders   TSH      Will increase dose to 112 for levothyroxine and add amlodipine for blood pressure Follow up plan: Return in about 3 months (around 04/10/2018), or if symptoms worsen or fail to improve, for Type 2 diabetes and thyroid.  Counseling provided for all of the vaccine components Orders Placed This Encounter  Procedures  . Bayer DCA Hb A1c Waived  . TSH    Caryl Pina, MD [place] 01/09/2018, 8:33 AM

## 2018-01-18 ENCOUNTER — Encounter: Payer: Self-pay | Admitting: *Deleted

## 2018-04-03 ENCOUNTER — Other Ambulatory Visit: Payer: Self-pay | Admitting: Family Medicine

## 2018-04-20 ENCOUNTER — Other Ambulatory Visit: Payer: Medicare Other

## 2018-04-20 ENCOUNTER — Other Ambulatory Visit: Payer: Self-pay

## 2018-04-20 ENCOUNTER — Ambulatory Visit: Payer: Medicare Other | Admitting: Family Medicine

## 2018-04-20 VITALS — BP 158/73 | HR 64

## 2018-04-20 DIAGNOSIS — E1142 Type 2 diabetes mellitus with diabetic polyneuropathy: Secondary | ICD-10-CM

## 2018-04-20 DIAGNOSIS — E039 Hypothyroidism, unspecified: Secondary | ICD-10-CM | POA: Diagnosis not present

## 2018-04-20 DIAGNOSIS — I10 Essential (primary) hypertension: Secondary | ICD-10-CM | POA: Diagnosis not present

## 2018-04-20 LAB — BAYER DCA HB A1C WAIVED: HB A1C (BAYER DCA - WAIVED): 7.1 % — ABNORMAL HIGH (ref <7.0)

## 2018-04-21 LAB — CMP14+EGFR
ALT: 24 IU/L (ref 0–44)
AST: 21 IU/L (ref 0–40)
Albumin/Globulin Ratio: 1.5 (ref 1.2–2.2)
Albumin: 4.5 g/dL (ref 3.8–4.8)
Alkaline Phosphatase: 92 IU/L (ref 39–117)
BUN/Creatinine Ratio: 17 (ref 10–24)
BUN: 16 mg/dL (ref 8–27)
Bilirubin Total: 0.3 mg/dL (ref 0.0–1.2)
CO2: 27 mmol/L (ref 20–29)
Calcium: 9.6 mg/dL (ref 8.6–10.2)
Chloride: 97 mmol/L (ref 96–106)
Creatinine, Ser: 0.94 mg/dL (ref 0.76–1.27)
GFR calc Af Amer: 97 mL/min/{1.73_m2} (ref >59)
GFR calc non Af Amer: 84 mL/min/{1.73_m2} (ref >59)
Globulin, Total: 3 g/dL (ref 1.5–4.5)
Glucose: 114 mg/dL — ABNORMAL HIGH (ref 65–99)
Potassium: 4.2 mmol/L (ref 3.5–5.2)
Sodium: 143 mmol/L (ref 134–144)
Total Protein: 7.5 g/dL (ref 6.0–8.5)

## 2018-04-21 LAB — CBC WITH DIFFERENTIAL/PLATELET
Basophils Absolute: 0.1 10*3/uL (ref 0.0–0.2)
Basos: 1 %
EOS (ABSOLUTE): 0.4 10*3/uL (ref 0.0–0.4)
Eos: 4 %
Hematocrit: 45.3 % (ref 37.5–51.0)
Hemoglobin: 14.7 g/dL (ref 13.0–17.7)
Immature Grans (Abs): 0 10*3/uL (ref 0.0–0.1)
Immature Granulocytes: 0 %
Lymphocytes Absolute: 2.4 10*3/uL (ref 0.7–3.1)
Lymphs: 27 %
MCH: 27.2 pg (ref 26.6–33.0)
MCHC: 32.5 g/dL (ref 31.5–35.7)
MCV: 84 fL (ref 79–97)
Monocytes Absolute: 0.8 10*3/uL (ref 0.1–0.9)
Monocytes: 9 %
Neutrophils Absolute: 5.3 10*3/uL (ref 1.4–7.0)
Neutrophils: 59 %
Platelets: 284 10*3/uL (ref 150–450)
RBC: 5.4 x10E6/uL (ref 4.14–5.80)
RDW: 13.5 % (ref 11.6–15.4)
WBC: 9 10*3/uL (ref 3.4–10.8)

## 2018-04-21 LAB — LIPID PANEL
Chol/HDL Ratio: 3.3 ratio (ref 0.0–5.0)
Cholesterol, Total: 137 mg/dL (ref 100–199)
HDL: 41 mg/dL (ref >39)
LDL Calculated: 77 mg/dL (ref 0–99)
Triglycerides: 96 mg/dL (ref 0–149)
VLDL Cholesterol Cal: 19 mg/dL (ref 5–40)

## 2018-04-21 LAB — TSH: TSH: 4.4 u[IU]/mL (ref 0.450–4.500)

## 2018-04-23 ENCOUNTER — Encounter: Payer: Self-pay | Admitting: Family Medicine

## 2018-04-23 ENCOUNTER — Other Ambulatory Visit: Payer: Self-pay

## 2018-04-23 ENCOUNTER — Ambulatory Visit (INDEPENDENT_AMBULATORY_CARE_PROVIDER_SITE_OTHER): Payer: Medicare Other | Admitting: Family Medicine

## 2018-04-23 DIAGNOSIS — E1142 Type 2 diabetes mellitus with diabetic polyneuropathy: Secondary | ICD-10-CM

## 2018-04-23 DIAGNOSIS — E039 Hypothyroidism, unspecified: Secondary | ICD-10-CM | POA: Diagnosis not present

## 2018-04-23 DIAGNOSIS — I10 Essential (primary) hypertension: Secondary | ICD-10-CM | POA: Diagnosis not present

## 2018-04-23 DIAGNOSIS — G43709 Chronic migraine without aura, not intractable, without status migrainosus: Secondary | ICD-10-CM | POA: Diagnosis not present

## 2018-04-23 DIAGNOSIS — I152 Hypertension secondary to endocrine disorders: Secondary | ICD-10-CM

## 2018-04-23 DIAGNOSIS — E1159 Type 2 diabetes mellitus with other circulatory complications: Secondary | ICD-10-CM | POA: Diagnosis not present

## 2018-04-23 MED ORDER — LEVOTHYROXINE SODIUM 112 MCG PO TABS: 112.0000 ug | ORAL_TABLET | Freq: Every day | ORAL | 3 refills | Status: DC

## 2018-04-23 MED ORDER — AMLODIPINE BESYLATE 10 MG PO TABS: 10.0000 mg | ORAL_TABLET | Freq: Every day | ORAL | 3 refills | Status: DC

## 2018-04-23 MED ORDER — SUMATRIPTAN SUCCINATE 100 MG PO TABS: ORAL_TABLET | ORAL | 11 refills | Status: DC

## 2018-04-23 NOTE — Progress Notes (Signed)
Virtual Visit via telephone Note  I connected with Stephen Lawson on 04/23/18 at 0926 by telephone and verified that I am speaking with the correct person using two identifiers. Stephen Lawson is currently located at home and no other people are currently with her during visit. The provider, Fransisca Kaufmann Nyrie Sigal, MD is located in their office at time of visit.  Call ended at 0945  I discussed the limitations, risks, security and privacy concerns of performing an evaluation and management service by telephone and the availability of in person appointments. I also discussed with the patient that there may be a patient responsible charge related to this service. The patient expressed understanding and agreed to proceed.   History and Present Illness: Type 2 diabetes mellitus Patient comes in today for recheck of his diabetes. Patient has been currently taking metformin, a1c 7.1. Patient is not currently on an ACE inhibitor/ARB. Patient has not seen an ophthalmologist this year. Patient denies any issues with their feet.   Hypothyroidism recheck Patient is coming in for thyroid recheck today as well. They deny any issues with hair changes or heat or cold problems or diarrhea or constipation. They deny any chest pain or palpitations. They are currently on levothyroxine 112.36mcrograms   Hypertension Patient is currently on amlodipine and atenolol, and their blood pressure today is 1073Xsystolic at home. Patient denies any lightheadedness or dizziness. Patient denies headaches, blurred vision, chest pains, shortness of breath, or weakness. Denies any side effects from medication and is content with current medication.   Migraines Uses sumatriptan as needed and wants to see if he needs a 30-day prescription for it, we will send the prescription and see if a prior authorization is needed for it.  Patient says it works well for him and he uses that couple times a week.  No diagnosis found.  Outpatient  Encounter Medications as of 04/23/2018  Medication Sig  . amLODipine (NORVASC) 5 MG tablet Take 1 tablet (5 mg total) by mouth daily.  .Marland Kitchenatenolol (TENORMIN) 100 MG tablet Take 1 tablet (100 mg total) by mouth daily.  . blood glucose meter kit and supplies KIT Dispense based on patient and insurance preference. Use up to four times daily as directed. (FOR ICD-9 250.00, 250.01).  . clobetasol cream (TEMOVATE) 01.06% Apply 1 application topically 2 (two) times daily.  . diazepam (VALIUM) 5 MG tablet 5 mg every 12 (twelve) hours as needed.   .Marland Kitchenlevothyroxine (SYNTHROID, LEVOTHROID) 100 MCG tablet Take 1 tablet (100 mcg total) by mouth daily.  .Marland Kitchenlevothyroxine (SYNTHROID, LEVOTHROID) 25 MCG tablet TAKE 0.5 TABLETS (12.5 MCG TOTAL) BY MOUTH DAILY BEFORE BREAKFAST.  . metFORMIN (GLUCOPHAGE) 500 MG tablet TAKE 1 TABLET TWICE A DAY  WITH MEALS  . methadone (DOLOPHINE) 10 MG tablet Take 10 mg by mouth 2 (two) times daily.  . SUMAtriptan (IMITREX) 100 MG tablet TAKE 1 TABLET EVERY 2 HOURS AS NEEDED FOR MIGRAINE-MAY REPEAT IN 2 HOURS IF HEADACHE PERSISTS   Facility-Administered Encounter Medications as of 04/23/2018  Medication  . levalbuterol (XOPENEX) nebulizer solution 1.25 mg  . levalbuterol (XOPENEX) nebulizer solution 1.25 mg    Review of Systems  Constitutional: Negative for chills and fever.  Eyes: Negative for visual disturbance.  Respiratory: Negative for shortness of breath and wheezing.   Cardiovascular: Negative for chest pain and leg swelling.  Musculoskeletal: Negative for back pain and gait problem.  Skin: Negative for rash.  Neurological: Negative for dizziness, weakness and light-headedness.  All other systems reviewed and are negative.   Observations/Objective: Patient sounds comfortable and in no acute distress  Assessment and Plan: Problem List Items Addressed This Visit      Cardiovascular and Mediastinum   Hypertension associated with diabetes ([place])   Relevant Medications    amLODipine (NORVASC) 10 MG tablet   Migraine headache   Relevant Medications   amLODipine (NORVASC) 10 MG tablet   SUMAtriptan (IMITREX) 100 MG tablet     Endocrine   T2DM (type 2 diabetes mellitus) ([place]) - Primary   Hypothyroid   Relevant Medications   levothyroxine (SYNTHROID) 112 MCG tablet       Follow Up Instructions:  Increased amlodipine to 10 mg from 5 mg   I discussed the assessment and treatment plan with the patient. The patient was provided an opportunity to ask questions and all were answered. The patient agreed with the plan and demonstrated an understanding of the instructions.   The patient was advised to call back or seek an in-person evaluation if the symptoms worsen or if the condition fails to improve as anticipated.  The above assessment and management plan was discussed with the patient. The patient verbalized understanding of and has agreed to the management plan. Patient is aware to call the clinic if symptoms persist or worsen. Patient is aware when to return to the clinic for a follow-up visit. Patient educated on when it is appropriate to go to the emergency department.    I provided 19 minutes of non-face-to-face time during this encounter.    Worthy Rancher, MD

## 2018-04-25 ENCOUNTER — Telehealth: Payer: Self-pay | Admitting: Family Medicine

## 2018-04-25 DIAGNOSIS — E1159 Type 2 diabetes mellitus with other circulatory complications: Secondary | ICD-10-CM

## 2018-04-25 DIAGNOSIS — G43709 Chronic migraine without aura, not intractable, without status migrainosus: Secondary | ICD-10-CM

## 2018-04-25 DIAGNOSIS — I152 Hypertension secondary to endocrine disorders: Secondary | ICD-10-CM

## 2018-04-25 DIAGNOSIS — I10 Essential (primary) hypertension: Principal | ICD-10-CM

## 2018-04-25 MED ORDER — LEVOTHYROXINE SODIUM 112 MCG PO TABS: 112.0000 ug | ORAL_TABLET | Freq: Every day | ORAL | 3 refills | Status: DC

## 2018-04-25 MED ORDER — SUMATRIPTAN SUCCINATE 100 MG PO TABS: ORAL_TABLET | ORAL | 11 refills | Status: DC

## 2018-04-25 MED ORDER — AMLODIPINE BESYLATE 10 MG PO TABS: 10.0000 mg | ORAL_TABLET | Freq: Every day | ORAL | 3 refills | Status: DC

## 2018-04-25 NOTE — Telephone Encounter (Signed)
Pt aware - sent to correct pharm

## 2018-07-23 ENCOUNTER — Other Ambulatory Visit: Payer: Medicare Other

## 2018-07-23 ENCOUNTER — Other Ambulatory Visit: Payer: Self-pay

## 2018-07-23 DIAGNOSIS — E1142 Type 2 diabetes mellitus with diabetic polyneuropathy: Secondary | ICD-10-CM | POA: Diagnosis not present

## 2018-07-23 DIAGNOSIS — I10 Essential (primary) hypertension: Secondary | ICD-10-CM

## 2018-07-23 DIAGNOSIS — E039 Hypothyroidism, unspecified: Secondary | ICD-10-CM | POA: Diagnosis not present

## 2018-07-23 DIAGNOSIS — E1159 Type 2 diabetes mellitus with other circulatory complications: Secondary | ICD-10-CM

## 2018-07-23 DIAGNOSIS — E119 Type 2 diabetes mellitus without complications: Secondary | ICD-10-CM

## 2018-07-23 LAB — BAYER DCA HB A1C WAIVED: HB A1C (BAYER DCA - WAIVED): 6.9 % (ref <7.0)

## 2018-07-24 ENCOUNTER — Encounter: Payer: Self-pay | Admitting: Family Medicine

## 2018-07-24 ENCOUNTER — Ambulatory Visit (INDEPENDENT_AMBULATORY_CARE_PROVIDER_SITE_OTHER): Payer: Medicare Other | Admitting: Family Medicine

## 2018-07-24 VITALS — BP 145/80 | HR 57 | Temp 97.1°F | Ht 71.0 in | Wt 232.4 lb

## 2018-07-24 DIAGNOSIS — E1159 Type 2 diabetes mellitus with other circulatory complications: Secondary | ICD-10-CM

## 2018-07-24 DIAGNOSIS — E039 Hypothyroidism, unspecified: Secondary | ICD-10-CM | POA: Diagnosis not present

## 2018-07-24 DIAGNOSIS — I10 Essential (primary) hypertension: Secondary | ICD-10-CM

## 2018-07-24 DIAGNOSIS — E1142 Type 2 diabetes mellitus with diabetic polyneuropathy: Secondary | ICD-10-CM | POA: Diagnosis not present

## 2018-07-24 LAB — CBC WITH DIFFERENTIAL/PLATELET
Basophils Absolute: 0.1 10*3/uL (ref 0.0–0.2)
Basos: 1 %
EOS (ABSOLUTE): 0.5 10*3/uL — ABNORMAL HIGH (ref 0.0–0.4)
Eos: 6 %
Hematocrit: 44.4 % (ref 37.5–51.0)
Hemoglobin: 14.6 g/dL (ref 13.0–17.7)
Immature Grans (Abs): 0 10*3/uL (ref 0.0–0.1)
Immature Granulocytes: 0 %
Lymphocytes Absolute: 2.9 10*3/uL (ref 0.7–3.1)
Lymphs: 33 %
MCH: 26.9 pg (ref 26.6–33.0)
MCHC: 32.9 g/dL (ref 31.5–35.7)
MCV: 82 fL (ref 79–97)
Monocytes Absolute: 0.7 10*3/uL (ref 0.1–0.9)
Monocytes: 8 %
Neutrophils Absolute: 4.6 10*3/uL (ref 1.4–7.0)
Neutrophils: 52 %
Platelets: 312 10*3/uL (ref 150–450)
RBC: 5.42 x10E6/uL (ref 4.14–5.80)
RDW: 13 % (ref 11.6–15.4)
WBC: 8.8 10*3/uL (ref 3.4–10.8)

## 2018-07-24 LAB — CMP14+EGFR
ALT: 23 IU/L (ref 0–44)
AST: 17 IU/L (ref 0–40)
Albumin/Globulin Ratio: 1.8 (ref 1.2–2.2)
Albumin: 4.6 g/dL (ref 3.8–4.8)
Alkaline Phosphatase: 91 IU/L (ref 39–117)
BUN/Creatinine Ratio: 13 (ref 10–24)
BUN: 11 mg/dL (ref 8–27)
Bilirubin Total: 0.2 mg/dL (ref 0.0–1.2)
CO2: 24 mmol/L (ref 20–29)
Calcium: 9.4 mg/dL (ref 8.6–10.2)
Chloride: 101 mmol/L (ref 96–106)
Creatinine, Ser: 0.87 mg/dL (ref 0.76–1.27)
GFR calc Af Amer: 103 mL/min/{1.73_m2} (ref >59)
GFR calc non Af Amer: 89 mL/min/{1.73_m2} (ref >59)
Globulin, Total: 2.6 g/dL (ref 1.5–4.5)
Glucose: 125 mg/dL — ABNORMAL HIGH (ref 65–99)
Potassium: 4.7 mmol/L (ref 3.5–5.2)
Sodium: 143 mmol/L (ref 134–144)
Total Protein: 7.2 g/dL (ref 6.0–8.5)

## 2018-07-24 LAB — LIPID PANEL
Chol/HDL Ratio: 3.5 ratio (ref 0.0–5.0)
Cholesterol, Total: 136 mg/dL (ref 100–199)
HDL: 39 mg/dL — ABNORMAL LOW (ref >39)
LDL Calculated: 86 mg/dL (ref 0–99)
Triglycerides: 57 mg/dL (ref 0–149)
VLDL Cholesterol Cal: 11 mg/dL (ref 5–40)

## 2018-07-24 LAB — TSH: TSH: 3.25 u[IU]/mL (ref 0.450–4.500)

## 2018-07-24 NOTE — Progress Notes (Signed)
BP (!) 145/80   Pulse (!) 57   Temp (!) 97.1 F (36.2 C) (Oral)   Ht _0  (1.803 m)   Wt 232 lb 6.4 oz (105.4 kg)   BMI 32.41 kg/m    Subjective:   Patient ID: Stephen Lawson, male    DOB: 1950/02/13, 68 y.o.   MRN: [id number]  HPI: Stephen Lawson is a 68 y.o. male presenting on 07/24/2018 for Diabetes (3 month follow up)   HPI Type 2 diabetes mellitus Patient comes in today for recheck of his diabetes. Patient has been currently taking metformin 500 twice a day. Patient is not currently on an ACE inhibitor/ARB. Patient has not seen an ophthalmologist this year. Patient denies any issues with their feet.  His A1c is currently 6.9, he had his blood work done yesterday  Hypertension Patient is currently on amlodipine and atenolol, and their blood pressure today is 145/80. Patient denies any lightheadedness or dizziness. Patient denies headaches, blurred vision, chest pains, shortness of breath, or weakness. Denies any side effects from medication and is content with current medication.   Hypothyroidism recheck Patient is coming in for thyroid recheck today as well. They deny any issues with hair changes or heat or cold problems or diarrhea or constipation. They deny any chest pain or palpitations. They are currently on levothyroxine 156mcrograms   Relevant past medical, surgical, family and social history reviewed and updated as indicated. Interim medical history since our last visit reviewed. Allergies and medications reviewed and updated.  Review of Systems  Constitutional: Negative for chills and fever.  Eyes: Negative for discharge.  Respiratory: Negative for shortness of breath and wheezing.   Cardiovascular: Negative for chest pain and leg swelling.  Musculoskeletal: Positive for arthralgias and back pain. Negative for gait problem.  Skin: Negative for rash.  Neurological: Negative for dizziness, weakness and light-headedness.  All other systems reviewed and are negative.   Per HPI unless specifically indicated above   Allergies as of 07/24/2018      Reactions   Darvocet [propoxyphene N-acetaminophen]    Itching   Talwin [pentazocine]    itching   Ultram [tramadol]       Medication List       Accurate as of July 24, 2018  8:06 AM. If you have any questions, ask your nurse or doctor.        amLODipine 10 MG tablet Commonly known as: NORVASC Take 1 tablet (10 mg total) by mouth daily.   atenolol 100 MG tablet Commonly known as: TENORMIN Take 1 tablet (100 mg total) by mouth daily.   blood glucose meter kit and supplies Kit Dispense based on patient and insurance preference. Use up to four times daily as directed. (FOR ICD-9 250.00, 250.01).   clobetasol cream 0.05 % Commonly known as: TEMOVATE Apply 1 application topically 2 (two) times daily.   levothyroxine 112 MCG tablet Commonly known as: SYNTHROID Take 1 tablet (112 mcg total) by mouth daily.   metFORMIN 500 MG tablet Commonly known as: GLUCOPHAGE TAKE 1 TABLET TWICE A DAY  WITH MEALS   methadone 10 MG tablet Commonly known as: DOLOPHINE Take 10 mg by mouth 2 (two) times daily.   SUMAtriptan 100 MG tablet Commonly known as: IMITREX TAKE 1 TABLET EVERY 2 HOURS AS NEEDED FOR MIGRAINE-MAY REPEAT IN 2 HOURS IF HEADACHE PERSISTS        Objective:   BP (!) 145/80   Pulse (!) 57   Temp (!) 97.1 F (  36.2 C) (Oral)   Ht _0  (1.803 m)   Wt 232 lb 6.4 oz (105.4 kg)   BMI 32.41 kg/m   Wt Readings from Last 3 Encounters:  07/24/18 232 lb 6.4 oz (105.4 kg)  01/09/18 245 lb 3.2 oz (111.2 kg)  10/05/17 245 lb 3.2 oz (111.2 kg)    Physical Exam Vitals signs and nursing note reviewed.  Constitutional:      General: He is not in acute distress.    Appearance: He is well-developed. He is not diaphoretic.  Eyes:     General: No scleral icterus.    Conjunctiva/sclera: Conjunctivae normal.  Neck:     Musculoskeletal: Neck supple.     Thyroid: No thyromegaly.   Cardiovascular:     Rate and Rhythm: Normal rate and regular rhythm.     Heart sounds: Normal heart sounds. No murmur.  Pulmonary:     Effort: Pulmonary effort is normal. No respiratory distress.     Breath sounds: Normal breath sounds. No wheezing.  Musculoskeletal:        General: No swelling.  Lymphadenopathy:     Cervical: No cervical adenopathy.  Skin:    General: Skin is warm and dry.     Findings: No rash.  Neurological:     Mental Status: He is alert and oriented to person, place, and time.     Coordination: Coordination normal.  Psychiatric:        Behavior: Behavior normal.     Results for orders placed or performed in visit on 07/23/18  CBC with Differential/Platelet  Result Value Ref Range   WBC 8.8 3.4 - 10.8 x10E3/uL   RBC 5.42 4.14 - 5.80 x10E6/uL   Hemoglobin 14.6 13.0 - 17.7 g/dL   Hematocrit 44.4 37.5 - 51.0 %   MCV 82 79 - 97 fL   MCH 26.9 26.6 - 33.0 pg   MCHC 32.9 31.5 - 35.7 g/dL   RDW 13.0 11.6 - 15.4 %   Platelets 312 150 - 450 x10E3/uL   Neutrophils 52 Not Estab. %   Lymphs 33 Not Estab. %   Monocytes 8 Not Estab. %   Eos 6 Not Estab. %   Basos 1 Not Estab. %   Neutrophils Absolute 4.6 1.4 - 7.0 x10E3/uL   Lymphocytes Absolute 2.9 0.7 - 3.1 x10E3/uL   Monocytes Absolute 0.7 0.1 - 0.9 x10E3/uL   EOS (ABSOLUTE) 0.5 (H) 0.0 - 0.4 x10E3/uL   Basophils Absolute 0.1 0.0 - 0.2 x10E3/uL   Immature Granulocytes 0 Not Estab. %   Immature Grans (Abs) 0.0 0.0 - 0.1 x10E3/uL  CMP14+EGFR  Result Value Ref Range   Glucose 125 (H) 65 - 99 mg/dL   BUN 11 8 - 27 mg/dL   Creatinine, Ser 0.87 0.76 - 1.27 mg/dL   GFR calc non Af Amer 89 >59 mL/min/1.73   GFR calc Af Amer 103 >59 mL/min/1.73   BUN/Creatinine Ratio 13 10 - 24   Sodium 143 134 - 144 mmol/L   Potassium 4.7 3.5 - 5.2 mmol/L   Chloride 101 96 - 106 mmol/L   CO2 24 20 - 29 mmol/L   Calcium 9.4 8.6 - 10.2 mg/dL   Total Protein 7.2 6.0 - 8.5 g/dL   Albumin 4.6 3.8 - 4.8 g/dL   Globulin, Total  2.6 1.5 - 4.5 g/dL   Albumin/Globulin Ratio 1.8 1.2 - 2.2   Bilirubin Total 0.2 0.0 - 1.2 mg/dL   Alkaline Phosphatase 91 39 - 117 IU/L  AST 17 0 - 40 IU/L   ALT 23 0 - 44 IU/L  Lipid panel  Result Value Ref Range   Cholesterol, Total 136 100 - 199 mg/dL   Triglycerides 57 0 - 149 mg/dL   HDL 39 (L) >39 mg/dL   VLDL Cholesterol Cal 11 5 - 40 mg/dL   LDL Calculated 86 0 - 99 mg/dL   Chol/HDL Ratio 3.5 0.0 - 5.0 ratio  Bayer DCA Hb A1c Waived  Result Value Ref Range   HB A1C (BAYER DCA - WAIVED) 6.9 <7.0 %  TSH  Result Value Ref Range   TSH 3.250 0.450 - 4.500 uIU/mL    Assessment & Plan:   Problem List Items Addressed This Visit      Cardiovascular and Mediastinum   Hypertension associated with diabetes ([place])     Endocrine   T2DM (type 2 diabetes mellitus) ([place]) - Primary   Hypothyroid    Patient has chronic arthritis that limits his activity levels but he has been trying to maintain activity and has lost some weight, is chasing around grandkids currently.  No changes in medication, continue currently and make sure he sees an eye doctor.  Follow up plan: Return in about 3 months (around 10/24/2018), or if symptoms worsen or fail to improve, for dm and htn.  Counseling provided for all of the vaccine components No orders of the defined types were placed in this encounter.   Caryl Pina, MD [facility] 07/24/2018, 8:06 AM

## 2018-08-10 ENCOUNTER — Other Ambulatory Visit: Payer: Self-pay | Admitting: Family Medicine

## 2018-08-13 ENCOUNTER — Telehealth: Payer: Self-pay | Admitting: Family Medicine

## 2018-08-13 ENCOUNTER — Encounter: Payer: Self-pay | Admitting: Family Medicine

## 2018-08-13 NOTE — Telephone Encounter (Signed)
Tobacco status changed to .25 pack of cigarettes daily.  Per patient request

## 2018-10-25 ENCOUNTER — Other Ambulatory Visit: Payer: Medicare Other

## 2018-10-25 ENCOUNTER — Other Ambulatory Visit: Payer: Self-pay

## 2018-10-25 ENCOUNTER — Other Ambulatory Visit: Payer: Self-pay | Admitting: Family Medicine

## 2018-10-25 DIAGNOSIS — E1142 Type 2 diabetes mellitus with diabetic polyneuropathy: Secondary | ICD-10-CM

## 2018-10-25 DIAGNOSIS — E039 Hypothyroidism, unspecified: Secondary | ICD-10-CM

## 2018-10-25 DIAGNOSIS — H6123 Impacted cerumen, bilateral: Secondary | ICD-10-CM | POA: Diagnosis not present

## 2018-10-25 NOTE — Progress Notes (Signed)
Placed orders for patient come in fasting today

## 2018-10-26 ENCOUNTER — Encounter: Payer: Self-pay | Admitting: Family Medicine

## 2018-10-26 ENCOUNTER — Ambulatory Visit (INDEPENDENT_AMBULATORY_CARE_PROVIDER_SITE_OTHER): Payer: Medicare Other | Admitting: Family Medicine

## 2018-10-26 DIAGNOSIS — I10 Essential (primary) hypertension: Secondary | ICD-10-CM | POA: Diagnosis not present

## 2018-10-26 DIAGNOSIS — E039 Hypothyroidism, unspecified: Secondary | ICD-10-CM

## 2018-10-26 DIAGNOSIS — I152 Hypertension secondary to endocrine disorders: Secondary | ICD-10-CM

## 2018-10-26 DIAGNOSIS — E1142 Type 2 diabetes mellitus with diabetic polyneuropathy: Secondary | ICD-10-CM

## 2018-10-26 DIAGNOSIS — E1159 Type 2 diabetes mellitus with other circulatory complications: Secondary | ICD-10-CM | POA: Diagnosis not present

## 2018-10-26 LAB — SPECIMEN STATUS

## 2018-10-26 LAB — SPECIMEN STATUS REPORT

## 2018-10-26 LAB — TSH: TSH: 2.36 u[IU]/mL (ref 0.450–4.500)

## 2018-10-26 MED ORDER — ATENOLOL 100 MG PO TABS: 100.0000 mg | ORAL_TABLET | Freq: Every day | ORAL | 3 refills | Status: DC

## 2018-10-26 MED ORDER — METFORMIN HCL 500 MG PO TABS: ORAL_TABLET | ORAL | 3 refills | Status: DC

## 2018-10-26 NOTE — Progress Notes (Signed)
Virtual Visit via telephone Note  I connected with Stephen Lawson on 10/26/18 at 0800 by telephone and verified that I am speaking with the correct person using two identifiers. Stephen Lawson is currently located at home and no other people are currently with her during visit. The provider, Fransisca Kaufmann Dettinger, MD is located in their office at time of visit.  Call ended at [phone]  I discussed the limitations, risks, security and privacy concerns of performing an evaluation and management service by telephone and the availability of in person appointments. I also discussed with the patient that there may be a patient responsible charge related to this service. The patient expressed understanding and agreed to proceed.   History and Present Illness: Type 2 diabetes mellitus Patient comes in today for recheck of his diabetes. Patient has been currently taking metformin, he does not check blood sugars. Patient is not currently on an ACE inhibitor/ARB. Patient has not seen an ophthalmologist this year. Patient denies any issues with their feet.   Hypothyroidism recheck Patient is coming in for thyroid recheck today as well. They deny any issues with hair changes or heat or cold problems or diarrhea or constipation. They deny any chest pain or palpitations. They are currently on levothyroxine 132mcrograms   Hypertension Patient is currently on amlodipine and atenolol, and their blood pressure is 161/72 in ENT yesterday. Patient denies any lightheadedness or dizziness. Patient denies headaches, blurred vision, chest pains, shortness of breath, or weakness. Denies any side effects from medication and is content with current medication.   No diagnosis found.  Outpatient Encounter Medications as of 10/26/2018  Medication Sig  . amLODipine (NORVASC) 10 MG tablet Take 1 tablet (10 mg total) by mouth daily.  .Marland Kitchenatenolol (TENORMIN) 100 MG tablet Take 1 tablet (100 mg total) by mouth daily.  . blood glucose  meter kit and supplies KIT Dispense based on patient and insurance preference. Use up to four times daily as directed. (FOR ICD-9 250.00, 250.01).  . clobetasol cream (TEMOVATE) 06.94% Apply 1 application topically 2 (two) times daily.  .Marland Kitchenlevothyroxine (SYNTHROID) 112 MCG tablet Take 1 tablet (112 mcg total) by mouth daily.  . metFORMIN (GLUCOPHAGE) 500 MG tablet TAKE 1 TABLET TWICE A DAY  WITH MEALS  . methadone (DOLOPHINE) 10 MG tablet Take 10 mg by mouth 2 (two) times daily.  . SUMAtriptan (IMITREX) 100 MG tablet TAKE 1 TABLET EVERY 2 HOURS AS NEEDED FOR MIGRAINE-MAY REPEAT IN 2 HOURS IF HEADACHE PERSISTS   No facility-administered encounter medications on file as of 10/26/2018.     Review of Systems  Constitutional: Negative for chills and fever.  Eyes: Negative for visual disturbance.  Respiratory: Negative for shortness of breath and wheezing.   Cardiovascular: Negative for chest pain and leg swelling.  Gastrointestinal: Negative for abdominal pain.  Musculoskeletal: Negative for back pain and gait problem.  Skin: Negative for rash.  Neurological: Negative for dizziness, weakness, light-headedness and numbness.  All other systems reviewed and are negative.   Observations/Objective: Patient sounds comfortable and in no acute distress  Assessment and Plan: Problem List Items Addressed This Visit      Cardiovascular and Mediastinum   Hy[place] with diabetes ([place]   Relevant Medications   atenolol (TENORMIN) 100 MG tablet   metFORMIN (GLUCOPHAGE) 500 MG tablet     Endocrine   T2DM (type 2 diabetes mellitus) ([place] - [place]   Relevant Medications   metFORMIN (GLUCOPHAGE) 500 MG tablet  Hypothyroid   Relevant Medications   atenolol (TENORMIN) 100 MG tablet    Other Visit Diagnoses    Essential hypertension       Relevant Medications   atenolol (TENORMIN) 100 MG tablet       Follow Up Instructions: Follow up in 3 months for type 2 diabetes and htn     I discussed the assessment and treatment plan with the patient. The patient was provided an opportunity to ask questions and all were answered. The patient agreed with the plan and demonstrated an understanding of the instructions.   The patient was advised to call back or seek an in-person evaluation if the symptoms worsen or if the condition fails to improve as anticipated.  The above assessment and management plan was discussed with the patient. The patient verbalized understanding of and has agreed to the management plan. Patient is aware to call the clinic if symptoms persist or worsen. Patient is aware when to return to the clinic for a follow-up visit. Patient educated on when it is appropriate to go to the emergency department.    I provided 11 minutes of non-face-to-face time during this encounter.    Worthy Rancher, MD

## 2018-10-29 LAB — SPECIMEN STATUS REPORT

## 2018-10-29 LAB — HGB A1C W/O EAG: Hgb A1c MFr Bld: 6.9 % — ABNORMAL HIGH (ref 4.8–5.6)

## 2018-12-20 ENCOUNTER — Other Ambulatory Visit: Payer: Self-pay | Admitting: Family Medicine

## 2018-12-20 DIAGNOSIS — E1142 Type 2 diabetes mellitus with diabetic polyneuropathy: Secondary | ICD-10-CM

## 2018-12-20 DIAGNOSIS — I10 Essential (primary) hypertension: Secondary | ICD-10-CM

## 2019-01-30 ENCOUNTER — Encounter: Payer: Self-pay | Admitting: Family Medicine

## 2019-01-30 ENCOUNTER — Ambulatory Visit (INDEPENDENT_AMBULATORY_CARE_PROVIDER_SITE_OTHER): Payer: Medicare Other | Admitting: Family Medicine

## 2019-01-30 DIAGNOSIS — I10 Essential (primary) hypertension: Secondary | ICD-10-CM

## 2019-01-30 DIAGNOSIS — E039 Hypothyroidism, unspecified: Secondary | ICD-10-CM

## 2019-01-30 DIAGNOSIS — E1159 Type 2 diabetes mellitus with other circulatory complications: Secondary | ICD-10-CM

## 2019-01-30 DIAGNOSIS — E1142 Type 2 diabetes mellitus with diabetic polyneuropathy: Secondary | ICD-10-CM

## 2019-01-30 DIAGNOSIS — I152 Hypertension secondary to endocrine disorders: Secondary | ICD-10-CM

## 2019-01-30 LAB — BAYER DCA HB A1C WAIVED: HB A1C (BAYER DCA - WAIVED): 6.9 % (ref <7.0)

## 2019-01-30 NOTE — Progress Notes (Signed)
Virtual Visit via telephone Note  I connected with Stephen Lawson on 01/30/19 at 0835 by telephone and verified that I am speaking with the correct person using two identifiers. Stephen Lawson is currently located at home and no other people are currently with her during visit. The provider, Fransisca Kaufmann Farzana Koci, MD is located in their office at time of visit.  Call ended at [place]  I discussed the limitations, risks, security and privacy concerns of performing an evaluation and management service by telephone and the availability of in person appointments. I also discussed with the patient that there may be a patient responsible charge related to this service. The patient expressed understanding and agreed to proceed.   History and Present Illness: Type 2 diabetes mellitus Patient comes in today for recheck of his diabetes. Patient has been currently taking metformin. Patient is not currently on an ACE inhibitor/ARB. Patient has not seen an ophthalmologist this year. Patient denies any issues with their feet.   Hypothyroidism recheck Patient is coming in for thyroid recheck today as well. They deny any issues with hair changes or heat or cold problems or diarrhea or constipation. They deny any chest pain or palpitations. They are currently on levothyroxine 146mcrograms   Hypertension Patient is currently on amlodipine and atenolol, and their blood pressure today is unknown. Patient denies any lightheadedness or dizziness. Patient denies headaches, blurred vision, chest pains, shortness of breath, or weakness. Denies any side effects from medication and is content with current medication.    No diagnosis found.  Outpatient Encounter Medications as of 01/30/2019  Medication Sig  . amLODipine (NORVASC) 10 MG tablet Take 1 tablet (10 mg total) by mouth daily.  .Marland Kitchenatenolol (TENORMIN) 100 MG tablet Take 1 tablet (100 mg total) by mouth daily.  . blood glucose meter kit and supplies KIT Dispense based  on patient and insurance preference. Use up to four times daily as directed. (FOR ICD-9 250.00, 250.01).  . clobetasol cream (TEMOVATE) 04.94% Apply 1 application topically 2 (two) times daily.  .Marland Kitchenlevothyroxine (SYNTHROID) 112 MCG tablet Take 1 tablet (112 mcg total) by mouth daily.  . metFORMIN (GLUCOPHAGE) 500 MG tablet TAKE 1 TABLET TWICE A DAY  WITH MEALS  . methadone (DOLOPHINE) 10 MG tablet Take 10 mg by mouth 2 (two) times daily.  . SUMAtriptan (IMITREX) 100 MG tablet TAKE 1 TABLET EVERY 2 HOURS AS NEEDED FOR MIGRAINE-MAY REPEAT IN 2 HOURS IF HEADACHE PERSISTS   No facility-administered encounter medications on file as of 01/30/2019.    Review of Systems  Constitutional: Negative for chills and fever.  Eyes: Negative for visual disturbance.  Respiratory: Negative for shortness of breath and wheezing.   Cardiovascular: Negative for chest pain and leg swelling.  Musculoskeletal: Negative for back pain and gait problem.  Skin: Negative for rash.  Neurological: Negative for dizziness, weakness and light-headedness.  All other systems reviewed and are negative.   Observations/Objective: Patient sounds comfortable and in no acute distress  Assessment and Plan: Problem List Items Addressed This Visit      Cardiovascular and Mediastinum   Hy[place]ension associated with diabetes ([place]   Relevant Orders   CBC with Differential/Platelet     Endocrine   T2DM (type 2 diabetes mellitus) ([place] - Primary   Relevant Orders   Bayer DCA Hb A1c Waived   CMP14+EGFR   Lipid panel   Hypothyroid   Relevant Orders   TSH      Continue current medication, no  changes at this point, will check blood work in the office. Follow up plan: Return in about 4 months (around 05/30/2019), or if symptoms worsen or fail to improve, for diabetes and thyroid.     I discussed the assessment and treatment plan with the patient. The patient was provided an opportunity to ask questions and all were answered.  The patient agreed with the plan and demonstrated an understanding of the instructions.   The patient was advised to call back or seek an in-person evaluation if the symptoms worsen or if the condition fails to improve as anticipated.  The above assessment and management plan was discussed with the patient. The patient verbalized understanding of and has agreed to the management plan. Patient is aware to call the clinic if symptoms persist or worsen. Patient is aware when to return to the clinic for a follow-up visit. Patient educated on when it is appropriate to go to the emergency department.    I provided 10 minutes of non-face-to-face time during this encounter.    Worthy Rancher, MD

## 2019-01-31 LAB — CBC WITH DIFFERENTIAL/PLATELET
Basophils Absolute: 0.1 10*3/uL (ref 0.0–0.2)
Basos: 1 %
EOS (ABSOLUTE): 0.4 10*3/uL (ref 0.0–0.4)
Eos: 5 %
Hematocrit: 44.8 % (ref 37.5–51.0)
Hemoglobin: 14.5 g/dL (ref 13.0–17.7)
Immature Grans (Abs): 0 10*3/uL (ref 0.0–0.1)
Immature Granulocytes: 0 %
Lymphocytes Absolute: 2.3 10*3/uL (ref 0.7–3.1)
Lymphs: 28 %
MCH: 27.1 pg (ref 26.6–33.0)
MCHC: 32.4 g/dL (ref 31.5–35.7)
MCV: 84 fL (ref 79–97)
Monocytes Absolute: 0.6 10*3/uL (ref 0.1–0.9)
Monocytes: 8 %
Neutrophils Absolute: 4.8 10*3/uL (ref 1.4–7.0)
Neutrophils: 58 %
Platelets: 336 10*3/uL (ref 150–450)
RBC: 5.35 x10E6/uL (ref 4.14–5.80)
RDW: 13.1 % (ref 11.6–15.4)
WBC: 8.2 10*3/uL (ref 3.4–10.8)

## 2019-01-31 LAB — CMP14+EGFR
ALT: 22 IU/L (ref 0–44)
AST: 20 IU/L (ref 0–40)
Albumin/Globulin Ratio: 1.8 (ref 1.2–2.2)
Albumin: 4.3 g/dL (ref 3.8–4.8)
Alkaline Phosphatase: 89 IU/L (ref 39–117)
BUN/Creatinine Ratio: 15 (ref 10–24)
BUN: 14 mg/dL (ref 8–27)
Bilirubin Total: 0.3 mg/dL (ref 0.0–1.2)
CO2: 26 mmol/L (ref 20–29)
Calcium: 9.3 mg/dL (ref 8.6–10.2)
Chloride: 101 mmol/L (ref 96–106)
Creatinine, Ser: 0.95 mg/dL (ref 0.76–1.27)
GFR calc Af Amer: 95 mL/min/{1.73_m2} (ref >59)
GFR calc non Af Amer: 82 mL/min/{1.73_m2} (ref >59)
Globulin, Total: 2.4 g/dL (ref 1.5–4.5)
Glucose: 156 mg/dL — ABNORMAL HIGH (ref 65–99)
Potassium: 4.3 mmol/L (ref 3.5–5.2)
Sodium: 140 mmol/L (ref 134–144)
Total Protein: 6.7 g/dL (ref 6.0–8.5)

## 2019-01-31 LAB — LIPID PANEL
Chol/HDL Ratio: 3.3 ratio (ref 0.0–5.0)
Cholesterol, Total: 131 mg/dL (ref 100–199)
HDL: 40 mg/dL (ref >39)
LDL Chol Calc (NIH): 80 mg/dL (ref 0–99)
Triglycerides: 47 mg/dL (ref 0–149)
VLDL Cholesterol Cal: 11 mg/dL (ref 5–40)

## 2019-01-31 LAB — TSH: TSH: 3.1 u[IU]/mL (ref 0.450–4.500)

## 2019-02-27 ENCOUNTER — Other Ambulatory Visit: Payer: Self-pay | Admitting: Family Medicine

## 2019-02-27 DIAGNOSIS — E1159 Type 2 diabetes mellitus with other circulatory complications: Secondary | ICD-10-CM

## 2019-04-30 ENCOUNTER — Other Ambulatory Visit: Payer: Self-pay | Admitting: Family Medicine

## 2019-04-30 DIAGNOSIS — G43709 Chronic migraine without aura, not intractable, without status migrainosus: Secondary | ICD-10-CM

## 2019-05-03 ENCOUNTER — Ambulatory Visit (INDEPENDENT_AMBULATORY_CARE_PROVIDER_SITE_OTHER): Payer: Medicare Other | Admitting: Family Medicine

## 2019-05-03 ENCOUNTER — Other Ambulatory Visit: Payer: Self-pay

## 2019-05-03 ENCOUNTER — Encounter: Payer: Self-pay | Admitting: Family Medicine

## 2019-05-03 VITALS — BP 136/76 | HR 63 | Temp 98.3°F | Ht 71.0 in | Wt 238.0 lb

## 2019-05-03 DIAGNOSIS — E1159 Type 2 diabetes mellitus with other circulatory complications: Secondary | ICD-10-CM

## 2019-05-03 DIAGNOSIS — E039 Hypothyroidism, unspecified: Secondary | ICD-10-CM

## 2019-05-03 DIAGNOSIS — E1142 Type 2 diabetes mellitus with diabetic polyneuropathy: Secondary | ICD-10-CM

## 2019-05-03 DIAGNOSIS — I152 Hypertension secondary to endocrine disorders: Secondary | ICD-10-CM

## 2019-05-03 DIAGNOSIS — I10 Essential (primary) hypertension: Secondary | ICD-10-CM | POA: Diagnosis not present

## 2019-05-03 LAB — BAYER DCA HB A1C WAIVED: HB A1C (BAYER DCA - WAIVED): 7.7 % — ABNORMAL HIGH (ref <7.0)

## 2019-05-03 MED ORDER — AMLODIPINE BESYLATE 10 MG PO TABS: 10.0000 mg | ORAL_TABLET | Freq: Every day | ORAL | 3 refills | Status: DC

## 2019-05-03 MED ORDER — ATENOLOL 100 MG PO TABS: 100.0000 mg | ORAL_TABLET | Freq: Every day | ORAL | 3 refills | Status: DC

## 2019-05-03 MED ORDER — LEVOTHYROXINE SODIUM 112 MCG PO TABS: 112.0000 ug | ORAL_TABLET | Freq: Every day | ORAL | 3 refills | Status: DC

## 2019-05-03 MED ORDER — METFORMIN HCL 500 MG PO TABS: ORAL_TABLET | ORAL | 3 refills | Status: DC

## 2019-05-03 NOTE — Progress Notes (Signed)
BP 136/76   Pulse 63   Temp 98.3 F (36.8 C)   Ht _0  (1.803 m)   Wt 238 lb (108 kg)   SpO2 97%   BMI 33.19 kg/m    Subjective:   Patient ID: Stephen Lawson, male    DOB: [birth year], 69 y.o.   MRN: [id number]  HPI: Stephen Lawson is a 69 y.o. male presenting on 05/03/2019 for Medical Management of Chronic Issues and Diabetes   HPI Type 2 diabetes mellitus Patient comes in today for recheck of his diabetes. Patient has been currently taking Metformin 500 twice a day, A1c is 7.7. Patient is not currently on an ACE inhibitor/ARB. Patient has not seen an ophthalmologist this year. Patient denies any issues with their feet.  Patient does have thickening and yellowing toenails on both great toes but it has been like that for some time.  Hypothyroidism recheck Patient is coming in for thyroid recheck today as well. They deny any issues with hair changes or heat or cold problems or diarrhea or constipation. They deny any chest pain or palpitations. They are currently on levothyroxine 112 micrograms   Hypertension Patient is currently on amlodipine and atenolol, and their blood pressure today is 136/76. Patient denies any lightheadedness or dizziness. Patient denies headaches, blurred vision, chest pains, shortness of breath, or weakness. Denies any side effects from medication and is content with current medication.   Relevant past medical, surgical, family and social history reviewed and updated as indicated. Interim medical history since our last visit reviewed. Allergies and medications reviewed and updated.  Review of Systems  Constitutional: Negative for chills and fever.  Eyes: Negative for visual disturbance.  Respiratory: Negative for shortness of breath and wheezing.   Cardiovascular: Negative for chest pain and leg swelling.  Musculoskeletal: Negative for back pain and gait problem.  Skin: Negative for rash.  Neurological: Negative for dizziness and weakness.  All other  systems reviewed and are negative.   Per HPI unless specifically indicated above   Allergies as of 05/03/2019      Reactions   Darvocet [propoxyphene N-acetaminophen]    Itching   Talwin [pentazocine]    itching   Ultram [tramadol]       Medication List       Accurate as of May 03, 2019  9:12 AM. If you have any questions, ask your nurse or doctor.        amLODipine 10 MG tablet Commonly known as: NORVASC TAKE 1 TABLET BY MOUTH EVERY DAY   atenolol 100 MG tablet Commonly known as: TENORMIN Take 1 tablet (100 mg total) by mouth daily.   blood glucose meter kit and supplies Kit Dispense based on patient and insurance preference. Use up to four times daily as directed. (FOR ICD-9 250.00, 250.01).   clobetasol cream 0.05 % Commonly known as: TEMOVATE Apply 1 application topically 2 (two) times daily.   levothyroxine 112 MCG tablet Commonly known as: SYNTHROID Take 1 tablet (112 mcg total) by mouth daily.   metFORMIN 500 MG tablet Commonly known as: GLUCOPHAGE TAKE 1 TABLET TWICE A DAY  WITH MEALS   methadone 10 MG tablet Commonly known as: DOLOPHINE Take 10 mg by mouth 2 (two) times daily.   SUMAtriptan 100 MG tablet Commonly known as: IMITREX TAKE 1 TABLET EVERY 2 HOURS AS NEEDED FOR MIGRAINE-MAY REPEAT IN 2 HOURS IF HEADACHE PERSISTS        Objective:   BP 136/76   Pulse 63  Temp 98.3 F (36.8 C)   Ht _0  (1.803 m)   Wt 238 lb (108 kg)   SpO2 97%   BMI 33.19 kg/m   Wt Readings from Last 3 Encounters:  05/03/19 238 lb (108 kg)  07/24/18 232 lb 6.4 oz (105.4 kg)  01/09/18 245 lb 3.2 oz (111.2 kg)    Physical Exam Vitals and nursing note reviewed.  Constitutional:      General: He is not in acute distress.    Appearance: He is well-developed. He is not diaphoretic.  Eyes:     General: No scleral icterus.    Conjunctiva/sclera: Conjunctivae normal.  Neck:     Thyroid: No thyromegaly.  Cardiovascular:     Rate and Rhythm: Normal rate  and regular rhythm.     Heart sounds: Normal heart sounds. No murmur.  Pulmonary:     Effort: Pulmonary effort is normal. No respiratory distress.     Breath sounds: Normal breath sounds. No wheezing.  Musculoskeletal:        General: Normal range of motion.     Cervical back: Neck supple.  Lymphadenopathy:     Cervical: No cervical adenopathy.  Skin:    General: Skin is warm and dry.     Findings: No rash.  Neurological:     Mental Status: He is alert and oriented to person, place, and time.     Coordination: Coordination normal.  Psychiatric:        Behavior: Behavior normal.    Diabetic Foot Exam - Simple   Simple Foot Form Diabetic Foot exam was performed with the following findings: Yes 05/03/2019  9:34 AM  Visual Inspection No deformities, no ulcerations, no other skin breakdown bilaterally: Yes Sensation Testing Intact to touch and monofilament testing bilaterally: Yes Pulse Check Posterior Tibialis and Dorsalis pulse intact bilaterally: Yes Comments Calluses on both medial aspects of the feet near the great toe.  Patient has onychomycosis in the left fifth toenail and left first toenail and some in the right first toenail with splitting and most of his toenail got on the right first toenail.  It is been like that for quite some time.       Assessment & Plan:   Problem List Items Addressed This Visit      Cardiovascular and Mediastinum   Hypertension associated with diabetes ([place])   Relevant Medications   metFORMIN (GLUCOPHAGE) 500 MG tablet   atenolol (TENORMIN) 100 MG tablet   amLODipine (NORVASC) 10 MG tablet     Endocrine   T2DM (type 2 diabetes mellitus) ([place]) - Primary   Relevant Medications   metFORMIN (GLUCOPHAGE) 500 MG tablet   Other Relevant Orders   Bayer DCA Hb A1c Waived   Hypothyroid   Relevant Medications   levothyroxine (SYNTHROID) 112 MCG tablet   atenolol (TENORMIN) 100 MG tablet   Other Relevant Orders   TSH    Other Visit Diagnoses     Essential hypertension       Relevant Medications   atenolol (TENORMIN) 100 MG tablet   amLODipine (NORVASC) 10 MG tablet    A1c 7.7, will increase his Metformin to take 2 in the morning and 1 in the evening.  Follow up plan: Return in about 3 months (around 08/02/2019), or if symptoms worsen or fail to improve, for Diabetes recheck.  Counseling provided for all of the vaccine components Orders Placed This Encounter  Procedures  . Bayer [facility] Hb A1c Waived    Caryl Pina, MD  [place] 05/03/2019, 9:12 AM

## 2019-05-04 LAB — TSH: TSH: 2.2 u[IU]/mL (ref 0.450–4.500)

## 2019-05-07 DIAGNOSIS — D485 Neoplasm of uncertain behavior of skin: Secondary | ICD-10-CM | POA: Diagnosis not present

## 2019-05-07 DIAGNOSIS — L280 Lichen simplex chronicus: Secondary | ICD-10-CM | POA: Diagnosis not present

## 2019-05-07 DIAGNOSIS — C44222 Squamous cell carcinoma of skin of right ear and external auricular canal: Secondary | ICD-10-CM | POA: Diagnosis not present

## 2019-05-23 DIAGNOSIS — H524 Presbyopia: Secondary | ICD-10-CM | POA: Diagnosis not present

## 2019-05-23 DIAGNOSIS — H2513 Age-related nuclear cataract, bilateral: Secondary | ICD-10-CM | POA: Diagnosis not present

## 2019-05-23 DIAGNOSIS — H52203 Unspecified astigmatism, bilateral: Secondary | ICD-10-CM | POA: Diagnosis not present

## 2019-05-23 DIAGNOSIS — E119 Type 2 diabetes mellitus without complications: Secondary | ICD-10-CM | POA: Diagnosis not present

## 2019-05-23 DIAGNOSIS — H5203 Hypermetropia, bilateral: Secondary | ICD-10-CM | POA: Diagnosis not present

## 2019-06-21 ENCOUNTER — Telehealth: Payer: Self-pay | Admitting: Family Medicine

## 2019-06-25 ENCOUNTER — Other Ambulatory Visit: Payer: Self-pay | Admitting: *Deleted

## 2019-06-25 DIAGNOSIS — G43709 Chronic migraine without aura, not intractable, without status migrainosus: Secondary | ICD-10-CM

## 2019-06-25 MED ORDER — SUMATRIPTAN SUCCINATE 100 MG PO TABS: ORAL_TABLET | ORAL | 9 refills | Status: DC

## 2019-07-01 ENCOUNTER — Telehealth: Payer: Self-pay | Admitting: Family Medicine

## 2019-07-01 DIAGNOSIS — G43709 Chronic migraine without aura, not intractable, without status migrainosus: Secondary | ICD-10-CM

## 2019-07-01 MED ORDER — SUMATRIPTAN SUCCINATE 100 MG PO TABS: ORAL_TABLET | ORAL | 3 refills | Status: DC

## 2019-07-01 NOTE — Telephone Encounter (Signed)
Sent prescription to [facility] mail order

## 2019-07-01 NOTE — Telephone Encounter (Signed)
Pt need rx for SUMAtriptan (IMITREX) 100 MG tablet to go to [facility] order--90 day supply

## 2019-07-01 NOTE — Telephone Encounter (Signed)
Patient has refills on 30 day quantity scripts.  Could this be changed to 90 day quantity?

## 2019-07-01 NOTE — Telephone Encounter (Signed)
Patient aware and verbalized understanding. °

## 2019-07-05 ENCOUNTER — Other Ambulatory Visit: Payer: Self-pay | Admitting: Family Medicine

## 2019-07-05 NOTE — Telephone Encounter (Signed)
Patient informed that prescription was sent to [facility] on 07/01/2019.

## 2019-07-22 ENCOUNTER — Telehealth: Payer: Self-pay | Admitting: Family Medicine

## 2019-07-22 DIAGNOSIS — G43709 Chronic migraine without aura, not intractable, without status migrainosus: Secondary | ICD-10-CM

## 2019-07-22 MED ORDER — SUMATRIPTAN SUCCINATE 100 MG PO TABS: ORAL_TABLET | ORAL | 3 refills | Status: DC

## 2019-07-22 NOTE — Telephone Encounter (Signed)
  Prescription Request  07/22/2019  What is the name of the medication or equipment? Sumatriptan  Have you contacted your pharmacy to request a refill? (if applicable) Yes  Which pharmacy would you like this sent to? [facility], [place] would only give him 9 pills of the 30 pills he was prescribed. Pt says he has had a lot of issues with [employer] and just wants [facility].  Patient notified that their request is being sent to the clinical staff for review and that they should receive a response within 2 business days.

## 2019-07-22 NOTE — Telephone Encounter (Signed)
Refill sent to pharmacy.   

## 2019-07-29 DIAGNOSIS — Z85828 Personal history of other malignant neoplasm of skin: Secondary | ICD-10-CM | POA: Diagnosis not present

## 2019-07-29 DIAGNOSIS — L821 Other seborrheic keratosis: Secondary | ICD-10-CM | POA: Diagnosis not present

## 2019-08-09 ENCOUNTER — Ambulatory Visit: Payer: Medicare Other | Admitting: Family Medicine

## 2019-09-11 ENCOUNTER — Encounter: Payer: Self-pay | Admitting: Family Medicine

## 2019-09-11 ENCOUNTER — Other Ambulatory Visit: Payer: Self-pay

## 2019-09-11 ENCOUNTER — Ambulatory Visit (INDEPENDENT_AMBULATORY_CARE_PROVIDER_SITE_OTHER): Payer: Medicare Other | Admitting: Family Medicine

## 2019-09-11 VITALS — BP 142/70 | HR 64 | Temp 98.2°F | Ht 71.0 in | Wt 234.0 lb

## 2019-09-11 DIAGNOSIS — E1159 Type 2 diabetes mellitus with other circulatory complications: Secondary | ICD-10-CM

## 2019-09-11 DIAGNOSIS — K760 Fatty (change of) liver, not elsewhere classified: Secondary | ICD-10-CM

## 2019-09-11 DIAGNOSIS — I10 Essential (primary) hypertension: Secondary | ICD-10-CM | POA: Diagnosis not present

## 2019-09-11 DIAGNOSIS — E039 Hypothyroidism, unspecified: Secondary | ICD-10-CM

## 2019-09-11 DIAGNOSIS — E1142 Type 2 diabetes mellitus with diabetic polyneuropathy: Secondary | ICD-10-CM | POA: Diagnosis not present

## 2019-09-11 LAB — BAYER DCA HB A1C WAIVED: HB A1C (BAYER DCA - WAIVED): 6.9 % (ref <7.0)

## 2019-09-11 NOTE — Progress Notes (Signed)
BP (!) 142/70   Pulse 64   Temp 98.2 F (36.8 C)   Ht _0  (1.803 m)   Wt 234 lb (106.1 kg)   SpO2 96%   BMI 32.64 kg/m    Subjective:   Patient ID: Stephen Lawson, male    DOB: 27-May-1950, 69 y.o.   MRN: [id number]  HPI: Stephen Lawson is a 69 y.o. male presenting on 09/11/2019 for Medical Management of Chronic Issues and Diabetes   HPI Hypertension Patient is currently on amlodipine and atenolol, and their blood pressure today is 142/70. Patient denies any lightheadedness or dizziness. Patient denies headaches, blurred vision, chest pains, shortness of breath, or weakness. Denies any side effects from medication and is content with current medication.   Hyperlipidemia and fatty liver disease Patient is coming in for recheck of his hyperlipidemia. The patient is currently taking none currently. They deny any issues with myalgias or history of liver damage from it. They deny any focal numbness or weakness or chest pain.   Hypothyroidism recheck Patient is coming in for thyroid recheck today as well. They deny any issues with hair changes or heat or cold problems or diarrhea or constipation. They deny any chest pain or palpitations. They are currently on levothyroxine 167mcrograms   Relevant past medical, surgical, family and social history reviewed and updated as indicated. Interim medical history since our last visit reviewed. Allergies and medications reviewed and updated.  Review of Systems  Constitutional: Negative for chills and fever.  Eyes: Negative for visual disturbance.  Respiratory: Negative for shortness of breath and wheezing.   Cardiovascular: Negative for chest pain and leg swelling.  Musculoskeletal: Negative for back pain and gait problem.  Skin: Negative for rash.  Neurological: Negative for dizziness, weakness and numbness.  All other systems reviewed and are negative.   Per HPI unless specifically indicated above   Allergies as of 09/11/2019       Reactions   Darvocet [propoxyphene N-acetaminophen]    Itching   Talwin [pentazocine]    itching   Ultram [tramadol]       Medication List       Accurate as of September 11, 2019  3:32 PM. If you have any questions, ask your nurse or doctor.        amLODipine 10 MG tablet Commonly known as: NORVASC Take 1 tablet (10 mg total) by mouth daily.   atenolol 100 MG tablet Commonly known as: TENORMIN Take 1 tablet (100 mg total) by mouth daily.   blood glucose meter kit and supplies Kit Dispense based on patient and insurance preference. Use up to four times daily as directed. (FOR ICD-9 250.00, 250.01).   clobetasol cream 0.05 % Commonly known as: TEMOVATE Apply 1 application topically 2 (two) times daily.   levothyroxine 112 MCG tablet Commonly known as: SYNTHROID Take 1 tablet (112 mcg total) by mouth daily.   metFORMIN 500 MG tablet Commonly known as: GLUCOPHAGE Take 2 tablets in the a.m. and 1 in the PM   methadone 10 MG tablet Commonly known as: DOLOPHINE Take 10 mg by mouth 2 (two) times daily.   SUMAtriptan 100 MG tablet Commonly known as: IMITREX May repeat in 2 hours if headache persists or recurs.        Objective:   BP (!) 142/70   Pulse 64   Temp 98.2 F (36.8 C)   Ht _1  (1.803 m)   Wt 234 lb (106.1 kg)   SpO2 96%  BMI 32.64 kg/m   Wt Readings from Last 3 Encounters:  09/11/19 234 lb (106.1 kg)  05/03/19 238 lb (108 kg)  07/24/18 232 lb 6.4 oz (105.4 kg)    Physical Exam Vitals and nursing note reviewed.  Constitutional:      General: He is not in acute distress.    Appearance: He is well-developed. He is not diaphoretic.  Eyes:     General: No scleral icterus.    Conjunctiva/sclera: Conjunctivae normal.  Neck:     Thyroid: No thyromegaly.  Cardiovascular:     Rate and Rhythm: Normal rate and regular rhythm.     Heart sounds: Normal heart sounds. No murmur heard.   Pulmonary:     Effort: Pulmonary effort is normal. No  respiratory distress.     Breath sounds: Normal breath sounds. No wheezing.  Musculoskeletal:        General: Normal range of motion.     Cervical back: Neck supple.  Lymphadenopathy:     Cervical: No cervical adenopathy.  Skin:    General: Skin is warm and dry.     Findings: No rash.  Neurological:     Mental Status: He is alert and oriented to person, place, and time.     Coordination: Coordination normal.  Psychiatric:        Behavior: Behavior normal.       Assessment & Plan:   Problem List Items Addressed This Visit      Cardiovascular and Mediastinum   Hypertension associated with diabetes ([place])   Relevant Orders   CMP14+EGFR     Digestive   Fatty liver   Relevant Orders   Lipid panel     Endocrine   T2DM (type 2 diabetes mellitus) ([place]) - Primary   Relevant Orders   Bayer DCA Hb A1c Waived   CBC with Differential/Platelet   CMP14+EGFR   Hypothyroid   Relevant Orders   TSH     A1c looks good at 6.9, continue current medication.   Follow up plan: Return in about 3 months (around 12/11/2019), or if symptoms worsen or fail to improve, for Diabetes and thyroid.  Counseling provided for all of the vaccine components Orders Placed This Encounter  Procedures  . Bayer DCA Hb A1c Waived  . CBC with Differential/Platelet  . CMP14+EGFR  . Lipid panel  . TSH    Caryl Pina, MD [facility] 09/11/2019, 3:32 PM

## 2019-09-12 LAB — CBC WITH DIFFERENTIAL/PLATELET
Basophils Absolute: 0.1 10*3/uL (ref 0.0–0.2)
Basos: 1 %
EOS (ABSOLUTE): 0.7 10*3/uL — ABNORMAL HIGH (ref 0.0–0.4)
Eos: 8 %
Hematocrit: 43.7 % (ref 37.5–51.0)
Hemoglobin: 14.4 g/dL (ref 13.0–17.7)
Immature Grans (Abs): 0 10*3/uL (ref 0.0–0.1)
Immature Granulocytes: 0 %
Lymphocytes Absolute: 2.1 10*3/uL (ref 0.7–3.1)
Lymphs: 24 %
MCH: 27.2 pg (ref 26.6–33.0)
MCHC: 33 g/dL (ref 31.5–35.7)
MCV: 83 fL (ref 79–97)
Monocytes Absolute: 0.6 10*3/uL (ref 0.1–0.9)
Monocytes: 7 %
Neutrophils Absolute: 5 10*3/uL (ref 1.4–7.0)
Neutrophils: 60 %
Platelets: 288 10*3/uL (ref 150–450)
RBC: 5.3 x10E6/uL (ref 4.14–5.80)
RDW: 13.6 % (ref 11.6–15.4)
WBC: 8.5 10*3/uL (ref 3.4–10.8)

## 2019-09-12 LAB — CMP14+EGFR
ALT: 25 IU/L (ref 0–44)
AST: 21 IU/L (ref 0–40)
Albumin/Globulin Ratio: 1.8 (ref 1.2–2.2)
Albumin: 4.6 g/dL (ref 3.8–4.8)
Alkaline Phosphatase: 89 IU/L (ref 48–121)
BUN/Creatinine Ratio: 16 (ref 10–24)
BUN: 16 mg/dL (ref 8–27)
Bilirubin Total: 0.4 mg/dL (ref 0.0–1.2)
CO2: 27 mmol/L (ref 20–29)
Calcium: 9.4 mg/dL (ref 8.6–10.2)
Chloride: 102 mmol/L (ref 96–106)
Creatinine, Ser: 1.01 mg/dL (ref 0.76–1.27)
GFR calc Af Amer: 88 mL/min/{1.73_m2} (ref >59)
GFR calc non Af Amer: 76 mL/min/{1.73_m2} (ref >59)
Globulin, Total: 2.6 g/dL (ref 1.5–4.5)
Glucose: 115 mg/dL — ABNORMAL HIGH (ref 65–99)
Potassium: 4.1 mmol/L (ref 3.5–5.2)
Sodium: 143 mmol/L (ref 134–144)
Total Protein: 7.2 g/dL (ref 6.0–8.5)

## 2019-09-12 LAB — TSH: TSH: 2.82 u[IU]/mL (ref 0.450–4.500)

## 2019-09-12 LAB — LIPID PANEL
Chol/HDL Ratio: 3.1 ratio (ref 0.0–5.0)
Cholesterol, Total: 134 mg/dL (ref 100–199)
HDL: 43 mg/dL (ref >39)
LDL Chol Calc (NIH): 78 mg/dL (ref 0–99)
Triglycerides: 60 mg/dL (ref 0–149)
VLDL Cholesterol Cal: 13 mg/dL (ref 5–40)

## 2019-11-12 ENCOUNTER — Other Ambulatory Visit: Payer: Self-pay | Admitting: Family Medicine

## 2019-11-12 DIAGNOSIS — I10 Essential (primary) hypertension: Secondary | ICD-10-CM

## 2019-11-21 ENCOUNTER — Other Ambulatory Visit: Payer: Self-pay | Admitting: Family Medicine

## 2019-11-21 DIAGNOSIS — E1142 Type 2 diabetes mellitus with diabetic polyneuropathy: Secondary | ICD-10-CM

## 2019-12-11 ENCOUNTER — Other Ambulatory Visit: Payer: Self-pay

## 2019-12-11 ENCOUNTER — Ambulatory Visit (INDEPENDENT_AMBULATORY_CARE_PROVIDER_SITE_OTHER): Payer: Medicare Other | Admitting: Family Medicine

## 2019-12-11 ENCOUNTER — Encounter: Payer: Self-pay | Admitting: Family Medicine

## 2019-12-11 VITALS — BP 150/73 | HR 67 | Ht 71.0 in | Wt 242.0 lb

## 2019-12-11 DIAGNOSIS — I152 Hypertension secondary to endocrine disorders: Secondary | ICD-10-CM | POA: Diagnosis not present

## 2019-12-11 DIAGNOSIS — I10 Essential (primary) hypertension: Secondary | ICD-10-CM | POA: Diagnosis not present

## 2019-12-11 DIAGNOSIS — E039 Hypothyroidism, unspecified: Secondary | ICD-10-CM

## 2019-12-11 DIAGNOSIS — E1159 Type 2 diabetes mellitus with other circulatory complications: Secondary | ICD-10-CM | POA: Diagnosis not present

## 2019-12-11 DIAGNOSIS — G43709 Chronic migraine without aura, not intractable, without status migrainosus: Secondary | ICD-10-CM

## 2019-12-11 DIAGNOSIS — E1142 Type 2 diabetes mellitus with diabetic polyneuropathy: Secondary | ICD-10-CM

## 2019-12-11 LAB — BAYER DCA HB A1C WAIVED: HB A1C (BAYER DCA - WAIVED): 7.2 % — ABNORMAL HIGH (ref <7.0)

## 2019-12-11 MED ORDER — SUMATRIPTAN SUCCINATE 100 MG PO TABS: ORAL_TABLET | ORAL | 3 refills | Status: DC

## 2019-12-11 MED ORDER — AMLODIPINE BESYLATE 10 MG PO TABS: 10.0000 mg | ORAL_TABLET | Freq: Every day | ORAL | 3 refills | Status: DC

## 2019-12-11 MED ORDER — ATENOLOL 100 MG PO TABS: 100.0000 mg | ORAL_TABLET | Freq: Every day | ORAL | 3 refills | Status: DC

## 2019-12-11 MED ORDER — LEVOTHYROXINE SODIUM 112 MCG PO TABS
112.0000 ug | ORAL_TABLET | Freq: Every day | ORAL | 3 refills | Status: DC
Start: 2019-12-11 — End: 2020-09-15

## 2019-12-11 MED ORDER — METFORMIN HCL 500 MG PO TABS: ORAL_TABLET | ORAL | 3 refills | Status: DC

## 2019-12-11 NOTE — Progress Notes (Signed)
BP (!) 150/73   Pulse 67   Ht _0  (1.803 m)   Wt 242 lb (109.8 kg)   SpO2 96%   BMI 33.75 kg/m    Subjective:   Patient ID: Stephen Lawson, male    DOB: 05-03-1950, 69 y.o.   MRN: [id number]  HPI: Stephen Lawson is a 69 y.o. male presenting on 12/11/2019 for Medical Management of Chronic Issues and Diabetes   HPI Type 2 diabetes mellitus Patient comes in today for recheck of his diabetes. Patient has been currently taking Metformin, A1c is 7.2-year. Patient is not currently on an ACE inhibitor/ARB. Patient has not seen an ophthalmologist this year. Patient denies any issues with their feet. The symptom started onset as an adult hypertension and hypothyroidism ARE RELATED TO DM   Hypertension Patient is currently on amlodipine and atenolol, and their blood pressure today is 150/73. Patient denies any lightheadedness or dizziness. Patient denies headaches, blurred vision, chest pains, shortness of breath, or weakness. Denies any side effects from medication and is content with current medication.   Migraine recheck Patient is coming in for migraine recheck, use sumatriptan as needed.  Patient says it works well does not have migraines very often.  Hypothyroidism recheck Patient is coming in for thyroid recheck today as well. They deny any issues with hair changes or heat or cold problems or diarrhea or constipation. They deny any chest pain or palpitations. They are currently on levothyroxine 112 micrograms   Relevant past medical, surgical, family and social history reviewed and updated as indicated. Interim medical history since our last visit reviewed. Allergies and medications reviewed and updated.  Review of Systems  Constitutional: Negative for chills and fever.  Respiratory: Negative for shortness of breath and wheezing.   Cardiovascular: Negative for chest pain and leg swelling.  Musculoskeletal: Negative for back pain and gait problem.  Skin: Negative for rash.   Neurological: Negative for dizziness, weakness and numbness.  All other systems reviewed and are negative.   Per HPI unless specifically indicated above   Allergies as of 12/11/2019      Reactions   Darvocet [propoxyphene N-acetaminophen]    Itching   Talwin [pentazocine]    itching   Ultram [tramadol]       Medication List       Accurate as of December 11, 2019  2:15 PM. If you have any questions, ask your nurse or doctor.        amLODipine 10 MG tablet Commonly known as: NORVASC Take 1 tablet (10 mg total) by mouth daily.   atenolol 100 MG tablet Commonly known as: TENORMIN TAKE 1 TABLET (100 MG TOTAL) BY MOUTH DAILY.   blood glucose meter kit and supplies Kit Dispense based on patient and insurance preference. Use up to four times daily as directed. (FOR ICD-9 250.00, 250.01).   clobetasol cream 0.05 % Commonly known as: TEMOVATE Apply 1 application topically 2 (two) times daily.   levothyroxine 112 MCG tablet Commonly known as: SYNTHROID Take 1 tablet (112 mcg total) by mouth daily.   metFORMIN 500 MG tablet Commonly known as: GLUCOPHAGE TAKE 1 TABLET TWICE A DAY  WITH MEALS   methadone 10 MG tablet Commonly known as: DOLOPHINE Take 10 mg by mouth 2 (two) times daily.   SUMAtriptan 100 MG tablet Commonly known as: IMITREX May repeat in 2 hours if headache persists or recurs.        Objective:   BP (!) 150/73   Pulse  67   Ht _0  (1.803 m)   Wt 242 lb (109.8 kg)   SpO2 96%   BMI 33.75 kg/m   Wt Readings from Last 3 Encounters:  12/11/19 242 lb (109.8 kg)  09/11/19 234 lb (106.1 kg)  05/03/19 238 lb (108 kg)    Physical Exam Vitals and nursing note reviewed.  Constitutional:      General: He is not in acute distress.    Appearance: He is well-developed. He is not diaphoretic.  Eyes:     General: No scleral icterus.    Conjunctiva/sclera: Conjunctivae normal.  Neck:     Thyroid: No thyromegaly.  Cardiovascular:     Rate and Rhythm:  Normal rate and regular rhythm.     Heart sounds: Normal heart sounds. No murmur heard.   Pulmonary:     Effort: Pulmonary effort is normal. No respiratory distress.     Breath sounds: Normal breath sounds. No wheezing.  Musculoskeletal:        General: Normal range of motion.     Cervical back: Neck supple.  Lymphadenopathy:     Cervical: No cervical adenopathy.  Skin:    General: Skin is warm and dry.     Findings: No rash.  Neurological:     Mental Status: He is alert and oriented to person, place, and time.     Coordination: Coordination normal.  Psychiatric:        Behavior: Behavior normal.       Assessment & Plan:   Problem List Items Addressed This Visit      Cardiovascular and Mediastinum   Hypertension associated with diabetes ([place])   Relevant Medications   amLODipine (NORVASC) 10 MG tablet   atenolol (TENORMIN) 100 MG tablet   metFORMIN (GLUCOPHAGE) 500 MG tablet   Migraine headache   Relevant Medications   amLODipine (NORVASC) 10 MG tablet   atenolol (TENORMIN) 100 MG tablet   SUMAtriptan (IMITREX) 100 MG tablet     Endocrine   T2DM (type 2 diabetes mellitus) (HCC) - Primary   Relevant Medications   metFORMIN (GLUCOPHAGE) 500 MG tablet   Other Relevant Orders   Bayer DCA Hb A1c Waived   Microalbumin / creatinine urine ratio   Hypothyroid   Relevant Medications   atenolol (TENORMIN) 100 MG tablet   levothyroxine (SYNTHROID) 112 MCG tablet   Other Relevant Orders   TSH    Other Visit Diagnoses    Essential hypertension       Relevant Medications   amLODipine (NORVASC) 10 MG tablet   atenolol (TENORMIN) 100 MG tablet      Continue current medication, no changes, A1c is 7.2, focus on diet and reducing carbohydrates. Follow up plan: Return in about 3 months (around 03/10/2020), or if symptoms worsen or fail to improve, for Diabetes and hypertension and migraines.  Counseling provided for all of the vaccine components Orders Placed This Encounter   Procedures  . Bayer DCA Hb A1c Waived  . Microalbumin / creatinine urine ratio    Caryl Pina, MD [facility] 12/11/2019, 2:15 PM

## 2019-12-12 ENCOUNTER — Telehealth: Payer: Self-pay | Admitting: Family Medicine

## 2019-12-12 LAB — TSH: TSH: 3.57 u[IU]/mL (ref 0.450–4.500)

## 2019-12-12 NOTE — Telephone Encounter (Signed)
Sumatriptan directions are correct, he may take 1 and then may repeat in 2 hours if persists, I guess the only other thing I would say in it is that he cannot take more than 2/day max

## 2019-12-18 ENCOUNTER — Telehealth: Payer: Self-pay | Admitting: *Deleted

## 2019-12-18 NOTE — Chronic Care Management (AMB) (Signed)
  Chronic Care Management   Note  12/18/2019 Name: Stephen Lawson MRN: [id number] DOB: 11/15/1950  Stephen Lawson is a 69 y.o. year old male who is a primary care patient of Dettinger, Fransisca Kaufmann, MD. I reached out to [name] by phone today in response to a referral sent by Stephen Lawson health plan.     [name] was given information about Chronic Care Management services today including:  1. CCM service includes personalized support from designated clinical staff supervised by his physician, including individualized plan of care and coordination with other care providers 2. 24/7 contact phone numbers for assistance for urgent and routine care needs. 3. Service will only be billed when office clinical staff spend 20 minutes or more in a month to coordinate care. 4. Only one practitioner may furnish and bill the service in a calendar month. 5. The patient may stop CCM services at any time (effective at the end of the month) by phone call to the office staff. 6. The patient will be responsible for cost sharing (co-pay) of up to 20% of the service fee (after annual deductible is met).  Patient agreed to services and verbal consent obtained.   Follow up plan: Telephone appointment with care management team member scheduled for:01/10/2020   [facility]  Direct Dial: [phone]

## 2019-12-19 ENCOUNTER — Telehealth: Payer: Self-pay

## 2019-12-19 NOTE — Telephone Encounter (Signed)
  Prescription Request  12/19/2019  What is the name of the medication or equipment? sumatriptan  Have you contacted your pharmacy to request a refill? (if applicable) yes, they said he needed new rx  Which pharmacy would you like this sent to? [facility]   Patient notified that their request is being sent to the clinical staff for review and that they should receive a response within 2 business days.

## 2019-12-19 NOTE — Telephone Encounter (Signed)
Advised patient we had sent a prescription to [facility] on 12/11/2019.  He said he did not speak with a person but it was an automated recording that told him his prescription was due.  I advised patient to wait a few days and if he did not receive his medication to call us back.

## 2019-12-25 ENCOUNTER — Encounter: Payer: Self-pay | Admitting: Podiatry

## 2019-12-25 ENCOUNTER — Other Ambulatory Visit: Payer: Self-pay

## 2019-12-25 ENCOUNTER — Ambulatory Visit (INDEPENDENT_AMBULATORY_CARE_PROVIDER_SITE_OTHER): Payer: Medicare Other | Admitting: Podiatry

## 2019-12-25 DIAGNOSIS — L600 Ingrowing nail: Secondary | ICD-10-CM

## 2019-12-25 DIAGNOSIS — B351 Tinea unguium: Secondary | ICD-10-CM

## 2019-12-25 NOTE — Progress Notes (Signed)
Subjective:   Patient ID: Stephen Lawson, male   DOB: 69 y.o.   MRN: [id number]   HPI Patient states he is here for diabetic foot exam and also has a very thickened big toenail left that is impossible for him to cut and can become tender with history of having removal of the right 1.  Does have some spicules noted right and states his A1c has been around 7.0 and he does try to stay active and does not smoke   Review of Systems  All other systems reviewed and are negative.       Objective:  Physical Exam Vitals and nursing note reviewed.  Constitutional:      Appearance: He is well-developed and well-nourished.  Cardiovascular:     Pulses: Intact distal pulses.  Pulmonary:     Effort: Pulmonary effort is normal.  Musculoskeletal:        General: Normal range of motion.  Skin:    General: Skin is warm.  Neurological:     Mental Status: He is alert.     Neurovascular status was found to be intact muscle strength adequate range of motion adequate.  Patient is found to have a severely dystrophic thickened hallux nail left painful when pressed loose in its appearance with the right showing 2 small spicules localized.  Patient has good digit perfusion well oriented x3     Assessment:  Damage left hallux nail thick with pain along with small spicules right from previous surgery with diabetes under good control and no indication of diabetic foot pathology     Plan:  H&P discussed daily foot inspections and recommended removal of left hallux nail permanent.  Explained procedure risk patient wants surgery and understands risk signed consent form.  Today infiltrated the left hallux 60 mg like Marcaine mixture sterile prep done using sterile instrumentation I remove the hallux nail exposed matrix applied phenol 5 applications 30 seconds followed by alcohol lavage and sterile dressing.  Gave instructions for soaks encouraged him to call with any questions which may come up and patient will be  seen back to recheck

## 2019-12-25 NOTE — Patient Instructions (Signed)

## 2020-01-10 ENCOUNTER — Telehealth: Payer: Medicare Other

## 2020-01-10 ENCOUNTER — Telehealth: Payer: Self-pay | Admitting: *Deleted

## 2020-01-10 NOTE — Chronic Care Management (AMB) (Signed)
  Chronic Care Management   Note  01/10/2020 Name: Stephen Lawson MRN: [id number] DOB: October 08, 1950  Stephen Lawson is a 70 y.o. year old male who is a primary care patient of Dettinger, Fransisca Kaufmann, MD. I reached out to [name] by phone today in response to a referral sent by Stephen Lawson's health plan.     [name] was given information about Chronic Care Management services today including:  1. CCM service includes personalized support from designated clinical staff supervised by his physician, including individualized plan of care and coordination with other care providers 2. 24/7 contact phone numbers for assistance for urgent and routine care needs. 3. Service will only be billed when office clinical staff spend 20 minutes or more in a month to coordinate care. 4. Only one practitioner may furnish and bill the service in a calendar month. 5. The patient may stop CCM services at any time (effective at the end of the month) by phone call to the office staff. 6. The patient will be responsible for cost sharing (co-pay) of up to 20% of the service fee (after annual deductible is met).  Patient did not agree to enrollment in care management services and does not wish to consider at this time.  Follow up plan: Patient declines further follow up and engagement by the care management team. Appropriate care team members and provider have been notified via electronic communication. The care management team is available to follow up with the patient after provider conversation with the patient regarding recommendation for care management engagement and subsequent re-referral to the care management team.   [facility]

## 2020-03-05 ENCOUNTER — Encounter: Payer: Self-pay | Admitting: Family Medicine

## 2020-03-05 ENCOUNTER — Other Ambulatory Visit: Payer: Self-pay

## 2020-03-05 ENCOUNTER — Ambulatory Visit (INDEPENDENT_AMBULATORY_CARE_PROVIDER_SITE_OTHER): Payer: Medicare Other | Admitting: Family Medicine

## 2020-03-05 VITALS — BP 138/67 | HR 58 | Ht 71.0 in | Wt 241.0 lb

## 2020-03-05 DIAGNOSIS — E039 Hypothyroidism, unspecified: Secondary | ICD-10-CM | POA: Diagnosis not present

## 2020-03-05 DIAGNOSIS — E1159 Type 2 diabetes mellitus with other circulatory complications: Secondary | ICD-10-CM | POA: Diagnosis not present

## 2020-03-05 DIAGNOSIS — E1142 Type 2 diabetes mellitus with diabetic polyneuropathy: Secondary | ICD-10-CM | POA: Diagnosis not present

## 2020-03-05 DIAGNOSIS — I152 Hypertension secondary to endocrine disorders: Secondary | ICD-10-CM

## 2020-03-05 LAB — BAYER DCA HB A1C WAIVED: HB A1C (BAYER DCA - WAIVED): 7.2 % — ABNORMAL HIGH (ref <7.0)

## 2020-03-05 NOTE — Progress Notes (Signed)
BP 138/67   Pulse (!) 58   Ht 5' 11"  (1.803 m)   Wt 241 lb (109.3 kg)   SpO2 96%   BMI 33.61 kg/m    Subjective:   Patient ID: Stephen Lawson, male    DOB: 1950-07-12, 70 y.o.   MRN: [id number]  HPI: Stephen Lawson is a 70 y.o. male presenting on 03/05/2020 for Medical Management of Chronic Issues and Diabetes   HPI Type 2 diabetes mellitus Patient comes in today for recheck of his diabetes. Patient has been currently taking Metformin, A1c 7.2. Patient is not currently on an ACE inhibitor/ARB. Patient has not seen an ophthalmologist this year. Patient denies any issues with their feet. The symptom started onset as an adult hypothyroidism and hypertension ARE RELATED TO DM   Hypothyroidism recheck Patient is coming in for thyroid recheck today as well. They deny any issues with hair changes or heat or cold problems or diarrhea or constipation. They deny any chest pain or palpitations. They are currently on levothyroxine 112 micrograms   Hypertension Patient is currently on amlodipine and atenolol, and their blood pressure today is 138/67. Patient denies any lightheadedness or dizziness. Patient denies headaches, blurred vision, chest pains, shortness of breath, or weakness. Denies any side effects from medication and is content with current medication.   Relevant past medical, surgical, family and social history reviewed and updated as indicated. Interim medical history since our last visit reviewed. Allergies and medications reviewed and updated.  Review of Systems  Constitutional: Negative for chills and fever.  Respiratory: Negative for shortness of breath and wheezing.   Cardiovascular: Negative for chest pain and leg swelling.  Musculoskeletal: Negative for back pain and gait problem.  Skin: Negative for rash.  Neurological: Negative for dizziness, weakness and light-headedness.  All other systems reviewed and are negative.   Per HPI unless specifically indicated  above   Allergies as of 03/05/2020      Reactions   Darvocet [propoxyphene N-acetaminophen]    Itching   Talwin [pentazocine]    itching   Ultram [tramadol]       Medication List       Accurate as of March 05, 2020  1:39 PM. If you have any questions, ask your nurse or doctor.        amLODipine 10 MG tablet Commonly known as: NORVASC Take 1 tablet (10 mg total) by mouth daily.   atenolol 100 MG tablet Commonly known as: TENORMIN Take 1 tablet (100 mg total) by mouth daily.   blood glucose meter kit and supplies Kit Dispense based on patient and insurance preference. Use up to four times daily as directed. (FOR ICD-9 250.00, 250.01).   clobetasol cream 0.05 % Commonly known as: TEMOVATE Apply 1 application topically 2 (two) times daily.   levothyroxine 112 MCG tablet Commonly known as: SYNTHROID Take 1 tablet (112 mcg total) by mouth daily.   metFORMIN 500 MG tablet Commonly known as: GLUCOPHAGE TAKE 1 TABLET TWICE A DAY  WITH MEALS   methadone 10 MG tablet Commonly known as: DOLOPHINE Take 10 mg by mouth 2 (two) times daily.   SUMAtriptan 100 MG tablet Commonly known as: IMITREX May repeat in 2 hours if headache persists or recurs.        Objective:   BP 138/67   Pulse (!) 58   Ht 5' 11"  (1.803 m)   Wt 241 lb (109.3 kg)   SpO2 96%   BMI 33.61 kg/m   Wt  Readings from Last 3 Encounters:  03/05/20 241 lb (109.3 kg)  12/11/19 242 lb (109.8 kg)  09/11/19 234 lb (106.1 kg)    Physical Exam Vitals and nursing note reviewed.  Constitutional:      General: He is not in acute distress.    Appearance: He is well-developed and well-nourished. He is not diaphoretic.  Eyes:     General: No scleral icterus.    Extraocular Movements: EOM normal.     Conjunctiva/sclera: Conjunctivae normal.  Neck:     Thyroid: No thyromegaly.  Cardiovascular:     Rate and Rhythm: Normal rate and regular rhythm.     Pulses: Intact distal pulses.     Heart sounds: Normal  heart sounds. No murmur heard.   Pulmonary:     Effort: Pulmonary effort is normal. No respiratory distress.     Breath sounds: Normal breath sounds. No wheezing.  Musculoskeletal:        General: No edema. Normal range of motion.     Cervical back: Neck supple.  Lymphadenopathy:     Cervical: No cervical adenopathy.  Skin:    General: Skin is warm and dry.     Findings: No rash.  Neurological:     Mental Status: He is alert and oriented to person, place, and time.     Coordination: Coordination normal.  Psychiatric:        Mood and Affect: Mood and affect normal.        Behavior: Behavior normal.       Assessment & Plan:   Problem List Items Addressed This Visit      Cardiovascular and Mediastinum   Hypertension associated with diabetes ([place])   Relevant Orders   CBC with Differential/Platelet   CMP14+EGFR   Lipid panel   TSH   Bayer DCA Hb A1c Waived     Endocrine   T2DM (type 2 diabetes mellitus) ([place]) - Primary   Relevant Orders   CBC with Differential/Platelet   CMP14+EGFR   Lipid panel   TSH   Bayer DCA Hb A1c Waived   Microalbumin / creatinine urine ratio   Hypothyroid   Relevant Orders   CBC with Differential/Platelet   CMP14+EGFR   Lipid panel   TSH   Bayer DCA Hb A1c Waived      Continue current medication, A1c slightly high at 7.2, focus on diet and exercise, no medication change.  Blood pressure looks good today so keep those medicines the same as well.  Follow up plan: Return in about 3 months (around 06/05/2020), or if symptoms worsen or fail to improve, for Diabetes and hypothyroidism and hypertension.  Counseling provided for all of the vaccine components Orders Placed This Encounter  Procedures  . CBC with Differential/Platelet  . CMP14+EGFR  . Lipid panel  . TSH  . Bayer DCA Hb A1c Waived  . Microalbumin / creatinine urine ratio    Caryl Pina, MD [facility] 03/05/2020, 1:39 PM

## 2020-03-06 LAB — CBC WITH DIFFERENTIAL/PLATELET
Basophils Absolute: 0.1 10*3/uL (ref 0.0–0.2)
Basos: 1 %
EOS (ABSOLUTE): 0.4 10*3/uL (ref 0.0–0.4)
Eos: 4 %
Hematocrit: 44.2 % (ref 37.5–51.0)
Hemoglobin: 14.1 g/dL (ref 13.0–17.7)
Immature Grans (Abs): 0 10*3/uL (ref 0.0–0.1)
Immature Granulocytes: 0 %
Lymphocytes Absolute: 2.5 10*3/uL (ref 0.7–3.1)
Lymphs: 27 %
MCH: 26.6 pg (ref 26.6–33.0)
MCHC: 31.9 g/dL (ref 31.5–35.7)
MCV: 83 fL (ref 79–97)
Monocytes Absolute: 0.7 10*3/uL (ref 0.1–0.9)
Monocytes: 8 %
Neutrophils Absolute: 5.8 10*3/uL (ref 1.4–7.0)
Neutrophils: 60 %
Platelets: 307 10*3/uL (ref 150–450)
RBC: 5.31 x10E6/uL (ref 4.14–5.80)
RDW: 13.2 % (ref 11.6–15.4)
WBC: 9.6 10*3/uL (ref 3.4–10.8)

## 2020-03-06 LAB — MICROALBUMIN / CREATININE URINE RATIO
Creatinine, Urine: 109.2 mg/dL
Microalb/Creat Ratio: 12 mg/g creat (ref 0–29)
Microalbumin, Urine: 12.8 ug/mL

## 2020-03-06 LAB — CMP14+EGFR
ALT: 23 IU/L (ref 0–44)
AST: 18 IU/L (ref 0–40)
Albumin/Globulin Ratio: 1.9 (ref 1.2–2.2)
Albumin: 4.5 g/dL (ref 3.8–4.8)
Alkaline Phosphatase: 89 IU/L (ref 44–121)
BUN/Creatinine Ratio: 11 (ref 10–24)
BUN: 11 mg/dL (ref 8–27)
Bilirubin Total: 0.2 mg/dL (ref 0.0–1.2)
CO2: 25 mmol/L (ref 20–29)
Calcium: 9.1 mg/dL (ref 8.6–10.2)
Chloride: 104 mmol/L (ref 96–106)
Creatinine, Ser: 1.01 mg/dL (ref 0.76–1.27)
Globulin, Total: 2.4 g/dL (ref 1.5–4.5)
Glucose: 112 mg/dL — ABNORMAL HIGH (ref 65–99)
Potassium: 4.5 mmol/L (ref 3.5–5.2)
Sodium: 143 mmol/L (ref 134–144)
Total Protein: 6.9 g/dL (ref 6.0–8.5)
eGFR: 81 mL/min/{1.73_m2} (ref >59)

## 2020-03-06 LAB — LIPID PANEL
Chol/HDL Ratio: 3.1 ratio (ref 0.0–5.0)
Cholesterol, Total: 129 mg/dL (ref 100–199)
HDL: 42 mg/dL (ref >39)
LDL Chol Calc (NIH): 75 mg/dL (ref 0–99)
Triglycerides: 57 mg/dL (ref 0–149)
VLDL Cholesterol Cal: 12 mg/dL (ref 5–40)

## 2020-03-06 LAB — TSH: TSH: 2.1 u[IU]/mL (ref 0.450–4.500)

## 2020-03-11 ENCOUNTER — Encounter: Payer: Self-pay | Admitting: *Deleted

## 2020-06-05 ENCOUNTER — Ambulatory Visit: Payer: Medicare Other | Admitting: Family Medicine

## 2020-06-10 DIAGNOSIS — Z85828 Personal history of other malignant neoplasm of skin: Secondary | ICD-10-CM | POA: Diagnosis not present

## 2020-06-10 DIAGNOSIS — L820 Inflamed seborrheic keratosis: Secondary | ICD-10-CM | POA: Diagnosis not present

## 2020-06-10 DIAGNOSIS — D1801 Hemangioma of skin and subcutaneous tissue: Secondary | ICD-10-CM | POA: Diagnosis not present

## 2020-06-10 DIAGNOSIS — L858 Other specified epidermal thickening: Secondary | ICD-10-CM | POA: Diagnosis not present

## 2020-06-10 DIAGNOSIS — D485 Neoplasm of uncertain behavior of skin: Secondary | ICD-10-CM | POA: Diagnosis not present

## 2020-06-10 DIAGNOSIS — L821 Other seborrheic keratosis: Secondary | ICD-10-CM | POA: Diagnosis not present

## 2020-06-11 ENCOUNTER — Ambulatory Visit (INDEPENDENT_AMBULATORY_CARE_PROVIDER_SITE_OTHER): Payer: Medicare Other | Admitting: Family Medicine

## 2020-06-11 ENCOUNTER — Other Ambulatory Visit: Payer: Self-pay

## 2020-06-11 ENCOUNTER — Encounter: Payer: Self-pay | Admitting: Family Medicine

## 2020-06-11 VITALS — BP 125/67 | HR 60 | Ht 71.0 in | Wt 240.0 lb

## 2020-06-11 DIAGNOSIS — I152 Hypertension secondary to endocrine disorders: Secondary | ICD-10-CM

## 2020-06-11 DIAGNOSIS — E1142 Type 2 diabetes mellitus with diabetic polyneuropathy: Secondary | ICD-10-CM | POA: Diagnosis not present

## 2020-06-11 DIAGNOSIS — E039 Hypothyroidism, unspecified: Secondary | ICD-10-CM

## 2020-06-11 DIAGNOSIS — E1159 Type 2 diabetes mellitus with other circulatory complications: Secondary | ICD-10-CM

## 2020-06-11 LAB — BAYER DCA HB A1C WAIVED: HB A1C (BAYER DCA - WAIVED): 7.7 % — ABNORMAL HIGH (ref <7.0)

## 2020-06-11 MED ORDER — METFORMIN HCL 500 MG PO TABS: 1000.0000 mg | ORAL_TABLET | Freq: Two times a day (BID) | ORAL | 3 refills | Status: DC

## 2020-06-11 NOTE — Progress Notes (Signed)
BP 125/67   Pulse 60   Ht '5\' 11"'  (1.803 m)   Wt 240 lb (108.9 kg)   SpO2 94%   BMI 33.47 kg/m    Subjective:   Patient ID: Stephen Lawson, male    DOB: 06/06/1950, 70 y.o.   MRN: [id number]  HPI: Stephen Lawson is a 70 y.o. male presenting on 06/11/2020 for Medical Management of Chronic Issues, Diabetes, and Hypothyroidism   HPI Type 2 diabetes mellitus Patient comes in today for recheck of his diabetes. Patient has been currently taking metformin 500 twice daily. Patient is not currently on an ACE inhibitor/ARB. Patient has not seen an ophthalmologist this year. Patient denies any issues with their feet. The symptom started onset as an adult hypothyroidism hypertension ARE RELATED TO DM   Hypothyroidism recheck Patient is coming in for thyroid recheck today as well. They deny any issues with hair changes or heat or cold problems or diarrhea or constipation. They deny any chest pain or palpitations. They are currently on levothyroxine 112 micrograms   Hypertension Patient is currently on amlodipine and atenolol, and their blood pressure today is 125/67. Patient denies any lightheadedness or dizziness. Patient denies headaches, blurred vision, chest pains, shortness of breath, or weakness. Denies any side effects from medication and is content with current medication.   Relevant past medical, surgical, family and social history reviewed and updated as indicated. Interim medical history since our last visit reviewed. Allergies and medications reviewed and updated.  Review of Systems  Constitutional:  Negative for chills and fever.  Eyes:  Negative for visual disturbance.  Respiratory:  Negative for shortness of breath and wheezing.   Cardiovascular:  Negative for chest pain and leg swelling.  Musculoskeletal:  Negative for back pain and gait problem.  Skin:  Negative for rash.  All other systems reviewed and are negative.  Per HPI unless specifically indicated above   Allergies as  of 06/11/2020       Reactions   Darvocet [propoxyphene N-acetaminophen]    Itching   Talwin [pentazocine]    itching   Ultram [tramadol]         Medication List        Accurate as of June 11, 2020 11:02 AM. If you have any questions, ask your nurse or doctor.          amLODipine 10 MG tablet Commonly known as: NORVASC Take 1 tablet (10 mg total) by mouth daily.   atenolol 100 MG tablet Commonly known as: TENORMIN Take 1 tablet (100 mg total) by mouth daily.   blood glucose meter kit and supplies Kit Dispense based on patient and insurance preference. Use up to four times daily as directed. (FOR ICD-9 250.00, 250.01).   clobetasol cream 0.05 % Commonly known as: TEMOVATE Apply 1 application topically 2 (two) times daily.   levothyroxine 112 MCG tablet Commonly known as: SYNTHROID Take 1 tablet (112 mcg total) by mouth daily.   metFORMIN 500 MG tablet Commonly known as: GLUCOPHAGE TAKE 1 TABLET TWICE A DAY  WITH MEALS   methadone 10 MG tablet Commonly known as: DOLOPHINE Take 10 mg by mouth 2 (two) times daily.   SUMAtriptan 100 MG tablet Commonly known as: IMITREX May repeat in 2 hours if headache persists or recurs.         Objective:   BP 125/67   Pulse 60   Ht '5\' 11"'  (1.803 m)   Wt 240 lb (108.9 kg)   SpO2 94%  BMI 33.47 kg/m   Wt Readings from Last 3 Encounters:  06/11/20 240 lb (108.9 kg)  03/05/20 241 lb (109.3 kg)  12/11/19 242 lb (109.8 kg)    Physical Exam Vitals and nursing note reviewed.  Constitutional:      General: He is not in acute distress.    Appearance: He is well-developed. He is not diaphoretic.  Eyes:     General: No scleral icterus.       Right eye: No discharge.     Conjunctiva/sclera: Conjunctivae normal.     Pupils: Pupils are equal, round, and reactive to light.  Neck:     Thyroid: No thyromegaly.  Cardiovascular:     Rate and Rhythm: Normal rate and regular rhythm.     Heart sounds: Normal heart sounds. No  murmur heard. Pulmonary:     Effort: Pulmonary effort is normal. No respiratory distress.     Breath sounds: Normal breath sounds. No wheezing.  Musculoskeletal:        General: Normal range of motion.     Cervical back: Neck supple.  Lymphadenopathy:     Cervical: No cervical adenopathy.  Skin:    General: Skin is warm and dry.     Findings: No rash.  Neurological:     Mental Status: He is alert and oriented to person, place, and time.     Coordination: Coordination normal.  Psychiatric:        Behavior: Behavior normal.      Assessment & Plan:   Problem List Items Addressed This Visit       Cardiovascular and Mediastinum   Hypertension associated with diabetes ([place])     Endocrine   T2DM (type 2 diabetes mellitus) ([place]) - Primary   Relevant Orders   TSH   Bayer DCA Hb A1c Waived   Hypothyroid   Relevant Orders   TSH   Bayer DCA Hb A1c Waived    A1c 7.7 which is up, will increase metformin to 1000 twice daily and also recommended to focus on diet and reducing carbohydrates. Blood pressure looks good, no changes there. Follow up plan: Return in about 3 months (around 09/11/2020), or if symptoms worsen or fail to improve, for Diabetes and thyroid recheck.  Counseling provided for all of the vaccine components Orders Placed This Encounter  Procedures   TSH   Bayer [place] Hb A1c [place] Terryn Rosenkranz, MD [facility] 06/11/2020, 11:02 AM

## 2020-06-12 LAB — TSH: TSH: 2.14 u[IU]/mL (ref 0.450–4.500)

## 2020-08-26 DIAGNOSIS — Z79899 Other long term (current) drug therapy: Secondary | ICD-10-CM | POA: Diagnosis not present

## 2020-09-15 ENCOUNTER — Ambulatory Visit (INDEPENDENT_AMBULATORY_CARE_PROVIDER_SITE_OTHER): Payer: Medicare Other | Admitting: Family Medicine

## 2020-09-15 ENCOUNTER — Encounter: Payer: Self-pay | Admitting: Family Medicine

## 2020-09-15 ENCOUNTER — Other Ambulatory Visit: Payer: Self-pay

## 2020-09-15 VITALS — BP 135/71 | HR 74 | Ht 71.0 in | Wt 240.5 lb

## 2020-09-15 DIAGNOSIS — E039 Hypothyroidism, unspecified: Secondary | ICD-10-CM

## 2020-09-15 DIAGNOSIS — E1159 Type 2 diabetes mellitus with other circulatory complications: Secondary | ICD-10-CM

## 2020-09-15 DIAGNOSIS — I10 Essential (primary) hypertension: Secondary | ICD-10-CM | POA: Diagnosis not present

## 2020-09-15 DIAGNOSIS — E1142 Type 2 diabetes mellitus with diabetic polyneuropathy: Secondary | ICD-10-CM | POA: Diagnosis not present

## 2020-09-15 DIAGNOSIS — I152 Hypertension secondary to endocrine disorders: Secondary | ICD-10-CM

## 2020-09-15 LAB — BAYER DCA HB A1C WAIVED: HB A1C (BAYER DCA - WAIVED): 7.4 % — ABNORMAL HIGH (ref 4.8–5.6)

## 2020-09-15 MED ORDER — ATENOLOL 100 MG PO TABS: 100.0000 mg | ORAL_TABLET | Freq: Every day | ORAL | 3 refills | Status: DC

## 2020-09-15 MED ORDER — AMLODIPINE BESYLATE 10 MG PO TABS: 10.0000 mg | ORAL_TABLET | Freq: Every day | ORAL | 3 refills | Status: DC

## 2020-09-15 MED ORDER — LEVOTHYROXINE SODIUM 112 MCG PO TABS: 112.0000 ug | ORAL_TABLET | Freq: Every day | ORAL | 3 refills | Status: DC

## 2020-09-15 NOTE — Progress Notes (Signed)
BP 135/71   Pulse 74   Ht 5' 11"  (1.803 m)   Wt 240 lb 8 oz (109.1 kg)   SpO2 98%   BMI 33.54 kg/m    Subjective:   Patient ID: Stephen Lawson, male    DOB: [birth year], 70 y.o.   MRN: [id number]  HPI: Stephen Lawson is a 70 y.o. male presenting on 09/15/2020 for Medical Management of Chronic Issues, Diabetes, and Hypothyroidism   HPI Type 2 diabetes mellitus Patient comes in today for recheck of his diabetes. Patient has been currently taking metformin, A1c 7.4 up slightly. Patient is not currently on an ACE inhibitor/ARB. Patient has not seen an ophthalmologist this year. Patient denies any issues with their feet. The symptom started onset as an adult hypertension and hyperlipidemia ARE RELATED TO DM   Hypertension Patient is currently on amlodipine and atenolol, and their blood pressure today is 135/71. Patient denies any lightheadedness or dizziness. Patient denies headaches, blurred vision, chest pains, shortness of breath, or weakness. Denies any side effects from medication and is content with current medication.   Hypothyroidism recheck Patient is coming in for thyroid recheck today as well. They deny any issues with hair changes or heat or cold problems or diarrhea or constipation. They deny any chest pain or palpitations. They are currently on levothyroxine 112 micrograms   Relevant past medical, surgical, family and social history reviewed and updated as indicated. Interim medical history since our last visit reviewed. Allergies and medications reviewed and updated.  Review of Systems  Constitutional:  Negative for chills and fever.  Eyes:  Negative for discharge.  Respiratory:  Negative for shortness of breath and wheezing.   Cardiovascular:  Negative for chest pain and leg swelling.  Musculoskeletal:  Negative for back pain and gait problem.  Skin:  Negative for rash.  All other systems reviewed and are negative.  Per HPI unless specifically indicated  above   Allergies as of 09/15/2020       Reactions   Darvocet [propoxyphene N-acetaminophen]    Itching   Talwin [pentazocine]    itching   Ultram [tramadol]         Medication List        Accurate as of September 15, 2020  9:34 AM. If you have any questions, ask your nurse or doctor.          amLODipine 10 MG tablet Commonly known as: NORVASC Take 1 tablet (10 mg total) by mouth daily.   atenolol 100 MG tablet Commonly known as: TENORMIN Take 1 tablet (100 mg total) by mouth daily.   blood glucose meter kit and supplies Kit Dispense based on patient and insurance preference. Use up to four times daily as directed. (FOR ICD-9 250.00, 250.01).   clobetasol cream 0.05 % Commonly known as: TEMOVATE Apply 1 application topically 2 (two) times daily.   levothyroxine 112 MCG tablet Commonly known as: SYNTHROID Take 1 tablet (112 mcg total) by mouth daily.   metFORMIN 500 MG tablet Commonly known as: GLUCOPHAGE Take 2 tablets (1,000 mg total) by mouth 2 (two) times daily with a meal.   methadone 10 MG tablet Commonly known as: DOLOPHINE Take 10 mg by mouth 2 (two) times daily.   SUMAtriptan 100 MG tablet Commonly known as: IMITREX May repeat in 2 hours if headache persists or recurs.         Objective:   BP 135/71   Pulse 74   Ht 5' 11"  (1.803 m)  Wt 240 lb 8 oz (109.1 kg)   SpO2 98%   BMI 33.54 kg/m   Wt Readings from Last 3 Encounters:  09/15/20 240 lb 8 oz (109.1 kg)  06/11/20 240 lb (108.9 kg)  03/05/20 241 lb (109.3 kg)    Physical Exam Vitals and nursing note reviewed.  Constitutional:      General: He is not in acute distress.    Appearance: He is well-developed. He is not diaphoretic.  Eyes:     General: No scleral icterus.    Conjunctiva/sclera: Conjunctivae normal.  Neck:     Thyroid: No thyromegaly.  Cardiovascular:     Rate and Rhythm: Normal rate and regular rhythm.     Heart sounds: Normal heart sounds. No murmur  heard. Pulmonary:     Effort: Pulmonary effort is normal. No respiratory distress.     Breath sounds: Normal breath sounds. No wheezing.  Musculoskeletal:        General: Normal range of motion.     Cervical back: Neck supple.  Lymphadenopathy:     Cervical: No cervical adenopathy.  Skin:    General: Skin is warm and dry.     Findings: No rash.  Neurological:     Mental Status: He is alert and oriented to person, place, and time.     Coordination: Coordination normal.  Psychiatric:        Behavior: Behavior normal.     Assessment & Plan:   Problem List Items Addressed This Visit       Cardiovascular and Mediastinum   Hypertension associated with diabetes ([place])   Relevant Medications   amLODipine (NORVASC) 10 MG tablet   atenolol (TENORMIN) 100 MG tablet   Other Relevant Orders   CBC with Differential/Platelet   CMP14+EGFR   Lipid panel   Bayer DCA Hb A1c Waived   TSH     Endocrine   T2DM (type 2 diabetes mellitus) ([place]) - Primary   Relevant Orders   CBC with Differential/Platelet   CMP14+EGFR   Lipid panel   Bayer DCA Hb A1c Waived   TSH   Hypothyroid   Relevant Medications   levothyroxine (SYNTHROID) 112 MCG tablet   atenolol (TENORMIN) 100 MG tablet   Other Relevant Orders   CBC with Differential/Platelet   CMP14+EGFR   Lipid panel   Bayer DCA Hb A1c Waived   TSH   Other Visit Diagnoses     Essential hypertension       Relevant Medications   amLODipine (NORVASC) 10 MG tablet   atenolol (TENORMIN) 100 MG tablet       A1c up slightly, focus on diet and exercise, no other change medicine Follow up plan: Return in about 3 months (around 12/15/2020), or if symptoms worsen or fail to improve, for Diabetes recheck.  Counseling provided for all of the vaccine components Orders Placed This Encounter  Procedures   CBC with Differential/Platelet   CMP14+EGFR   Lipid panel   Bayer DCA Hb A1c Waived   TSH    Caryl Pina, MD [facility] 09/15/2020, 9:34 AM

## 2020-09-16 LAB — CMP14+EGFR
ALT: 19 IU/L (ref 0–44)
AST: 17 IU/L (ref 0–40)
Albumin/Globulin Ratio: 1.6 (ref 1.2–2.2)
Albumin: 4.3 g/dL (ref 3.8–4.8)
Alkaline Phosphatase: 95 IU/L (ref 44–121)
BUN/Creatinine Ratio: 15 (ref 10–24)
BUN: 14 mg/dL (ref 8–27)
Bilirubin Total: 0.4 mg/dL (ref 0.0–1.2)
CO2: 24 mmol/L (ref 20–29)
Calcium: 9.3 mg/dL (ref 8.6–10.2)
Chloride: 101 mmol/L (ref 96–106)
Creatinine, Ser: 0.96 mg/dL (ref 0.76–1.27)
Globulin, Total: 2.7 g/dL (ref 1.5–4.5)
Glucose: 139 mg/dL — ABNORMAL HIGH (ref 65–99)
Potassium: 4.3 mmol/L (ref 3.5–5.2)
Sodium: 140 mmol/L (ref 134–144)
Total Protein: 7 g/dL (ref 6.0–8.5)
eGFR: 86 mL/min/{1.73_m2} (ref >59)

## 2020-09-16 LAB — CBC WITH DIFFERENTIAL/PLATELET
Basophils Absolute: 0.1 10*3/uL (ref 0.0–0.2)
Basos: 1 %
EOS (ABSOLUTE): 0.6 10*3/uL — ABNORMAL HIGH (ref 0.0–0.4)
Eos: 7 %
Hematocrit: 42.4 % (ref 37.5–51.0)
Hemoglobin: 13.7 g/dL (ref 13.0–17.7)
Immature Grans (Abs): 0 10*3/uL (ref 0.0–0.1)
Immature Granulocytes: 0 %
Lymphocytes Absolute: 2.9 10*3/uL (ref 0.7–3.1)
Lymphs: 34 %
MCH: 26.4 pg — ABNORMAL LOW (ref 26.6–33.0)
MCHC: 32.3 g/dL (ref 31.5–35.7)
MCV: 82 fL (ref 79–97)
Monocytes Absolute: 0.7 10*3/uL (ref 0.1–0.9)
Monocytes: 8 %
Neutrophils Absolute: 4.3 10*3/uL (ref 1.4–7.0)
Neutrophils: 50 %
Platelets: 312 10*3/uL (ref 150–450)
RBC: 5.19 x10E6/uL (ref 4.14–5.80)
RDW: 13.3 % (ref 11.6–15.4)
WBC: 8.6 10*3/uL (ref 3.4–10.8)

## 2020-09-16 LAB — LIPID PANEL
Chol/HDL Ratio: 3.2 ratio (ref 0.0–5.0)
Cholesterol, Total: 123 mg/dL (ref 100–199)
HDL: 39 mg/dL — ABNORMAL LOW (ref >39)
LDL Chol Calc (NIH): 71 mg/dL (ref 0–99)
Triglycerides: 62 mg/dL (ref 0–149)
VLDL Cholesterol Cal: 13 mg/dL (ref 5–40)

## 2020-09-16 LAB — TSH: TSH: 4.01 u[IU]/mL (ref 0.450–4.500)

## 2020-10-19 ENCOUNTER — Other Ambulatory Visit: Payer: Self-pay | Admitting: Family Medicine

## 2020-10-19 DIAGNOSIS — G43709 Chronic migraine without aura, not intractable, without status migrainosus: Secondary | ICD-10-CM

## 2020-10-26 DIAGNOSIS — G894 Chronic pain syndrome: Secondary | ICD-10-CM | POA: Diagnosis not present

## 2020-11-02 DIAGNOSIS — G894 Chronic pain syndrome: Secondary | ICD-10-CM | POA: Diagnosis not present

## 2020-11-04 DIAGNOSIS — M545 Low back pain, unspecified: Secondary | ICD-10-CM | POA: Diagnosis not present

## 2020-12-16 ENCOUNTER — Ambulatory Visit (INDEPENDENT_AMBULATORY_CARE_PROVIDER_SITE_OTHER): Payer: Medicare Other | Admitting: Family Medicine

## 2020-12-16 ENCOUNTER — Telehealth: Payer: Self-pay | Admitting: Family Medicine

## 2020-12-16 ENCOUNTER — Encounter: Payer: Self-pay | Admitting: Family Medicine

## 2020-12-16 ENCOUNTER — Other Ambulatory Visit: Payer: Self-pay

## 2020-12-16 VITALS — BP 142/71 | HR 70 | Ht 71.0 in | Wt 240.0 lb

## 2020-12-16 DIAGNOSIS — E1142 Type 2 diabetes mellitus with diabetic polyneuropathy: Secondary | ICD-10-CM | POA: Diagnosis not present

## 2020-12-16 DIAGNOSIS — E1159 Type 2 diabetes mellitus with other circulatory complications: Secondary | ICD-10-CM

## 2020-12-16 DIAGNOSIS — I152 Hypertension secondary to endocrine disorders: Secondary | ICD-10-CM | POA: Diagnosis not present

## 2020-12-16 DIAGNOSIS — E039 Hypothyroidism, unspecified: Secondary | ICD-10-CM | POA: Diagnosis not present

## 2020-12-16 DIAGNOSIS — I10 Essential (primary) hypertension: Secondary | ICD-10-CM

## 2020-12-16 LAB — BAYER DCA HB A1C WAIVED: HB A1C (BAYER DCA - WAIVED): 7.4 % — ABNORMAL HIGH (ref 4.8–5.6)

## 2020-12-16 MED ORDER — METFORMIN HCL 500 MG PO TABS: 1000.0000 mg | ORAL_TABLET | Freq: Two times a day (BID) | ORAL | 3 refills | Status: DC

## 2020-12-16 MED ORDER — AMLODIPINE BESYLATE 10 MG PO TABS: 10.0000 mg | ORAL_TABLET | Freq: Every day | ORAL | 3 refills | Status: DC

## 2020-12-16 MED ORDER — LEVOTHYROXINE SODIUM 112 MCG PO TABS
112.0000 ug | ORAL_TABLET | Freq: Every day | ORAL | 3 refills | Status: DC
Start: 2020-12-16 — End: 2021-11-22

## 2020-12-16 MED ORDER — ATENOLOL 100 MG PO TABS: 100.0000 mg | ORAL_TABLET | Freq: Every day | ORAL | 3 refills | Status: DC

## 2020-12-16 NOTE — Progress Notes (Signed)
BP (!) 142/71    Pulse 70    Ht 5' 11"  (1.803 m)    Wt 240 lb (108.9 kg)    SpO2 99%    BMI 33.47 kg/m    Subjective:   Patient ID: Stephen Lawson, male    DOB: 19-Aug-1950, 70 y.o.   MRN: [id number]  HPI: Stephen Lawson is a 70 y.o. male presenting on 12/16/2020 for Medical Management of Chronic Issues, Diabetes, and Hypothyroidism   HPI Type 2 diabetes mellitus Patient comes in today for recheck of his diabetes. Patient has been currently taking metformin although that he does admit that he misses the morning dose about half of the time.. Patient is not currently on an ACE inhibitor/ARB. Patient has not seen an ophthalmologist this year. Patient denies any issues with their feet. The symptom started onset as an adult hypertension and hypothyroidism ARE RELATED TO DM   Hypertension Patient is currently on amlodipine and atenolol, and their blood pressure today is 142/71. Patient denies any lightheadedness or dizziness. Patient denies headaches, blurred vision, chest pains, shortness of breath, or weakness. Denies any side effects from medication and is content with current medication.   Hypothyroidism recheck Patient is coming in for thyroid recheck today as well. They deny any issues with hair changes or heat or cold problems or diarrhea or constipation. They deny any chest pain or palpitations. They are currently on levothyroxine 112 micrograms   Relevant past medical, surgical, family and social history reviewed and updated as indicated. Interim medical history since our last visit reviewed. Allergies and medications reviewed and updated.  Review of Systems  Constitutional:  Negative for chills and fever.  Respiratory:  Negative for shortness of breath and wheezing.   Cardiovascular:  Negative for chest pain and leg swelling.  Musculoskeletal:  Positive for arthralgias and back pain. Negative for gait problem.  Skin:  Negative for rash.  Neurological:  Negative for dizziness, weakness  and light-headedness.  All other systems reviewed and are negative.  Per HPI unless specifically indicated above   Allergies as of 12/16/2020       Reactions   Darvocet [propoxyphene N-acetaminophen]    Itching   Talwin [pentazocine]    itching   Ultram [tramadol]         Medication List        Accurate as of December 16, 2020 10:01 AM. If you have any questions, ask your nurse or doctor.          amLODipine 10 MG tablet Commonly known as: NORVASC Take 1 tablet (10 mg total) by mouth daily.   atenolol 100 MG tablet Commonly known as: TENORMIN Take 1 tablet (100 mg total) by mouth daily.   blood glucose meter kit and supplies Kit Dispense based on patient and insurance preference. Use up to four times daily as directed. (FOR ICD-9 250.00, 250.01).   clobetasol cream 0.05 % Commonly known as: TEMOVATE Apply 1 application topically 2 (two) times daily.   levothyroxine 112 MCG tablet Commonly known as: SYNTHROID Take 1 tablet (112 mcg total) by mouth daily.   metFORMIN 500 MG tablet Commonly known as: GLUCOPHAGE Take 2 tablets (1,000 mg total) by mouth 2 (two) times daily with a meal.   methadone 10 MG tablet Commonly known as: DOLOPHINE Take 10 mg by mouth 2 (two) times daily.   SUMAtriptan 100 MG tablet Commonly known as: IMITREX TAKE 1 TABLET DAILY AS NEEDED FOR HEADACHE, MAY REPEAT IN 2 HOURS IF  NEEDED, NO MORE THAN 2 TABLETS PER DAY         Objective:   BP (!) 142/71    Pulse 70    Ht 5' 11"  (1.803 m)    Wt 240 lb (108.9 kg)    SpO2 99%    BMI 33.47 kg/m   Wt Readings from Last 3 Encounters:  12/16/20 240 lb (108.9 kg)  09/15/20 240 lb 8 oz (109.1 kg)  06/11/20 240 lb (108.9 kg)    Physical Exam Vitals and nursing note reviewed.  Constitutional:      General: He is not in acute distress.    Appearance: He is well-developed. He is not diaphoretic.  Eyes:     General: No scleral icterus.    Conjunctiva/sclera: Conjunctivae normal.  Neck:      Thyroid: No thyromegaly.  Cardiovascular:     Rate and Rhythm: Normal rate and regular rhythm.     Heart sounds: Normal heart sounds. No murmur heard. Pulmonary:     Effort: Pulmonary effort is normal. No respiratory distress.     Breath sounds: Normal breath sounds. No wheezing.  Musculoskeletal:        General: No swelling.     Cervical back: Neck supple.  Lymphadenopathy:     Cervical: No cervical adenopathy.  Skin:    General: Skin is warm and dry.     Findings: No rash.  Neurological:     Mental Status: He is alert and oriented to person, place, and time.     Coordination: Coordination normal.  Psychiatric:        Behavior: Behavior normal.      Assessment & Plan:   Problem List Items Addressed This Visit       Cardiovascular and Mediastinum   Hypertension associated with diabetes ([place])     Endocrine   T2DM (type 2 diabetes mellitus) ([place]) - Primary   Relevant Orders   Bayer DCA Hb A1c Waived   TSH   Hypothyroid   Relevant Orders   TSH  Continue current medicines, A1c 7.4, we discussed changes versus just trying to be more consistent.  He says he is going to try to be more consistent on his a.m. metformin by setting an alarm and talking to his wife about reminding him and see if he does little better with that and recheck in 3 months.  BP mildly elevated, allowing permissive hypertension.  Follow up plan: Return in about 3 months (around 03/16/2021), or if symptoms worsen or fail to improve, for Diabetes and hypertension and thyroid.  Counseling provided for all of the vaccine components Orders Placed This Encounter  Procedures   Bayer [place] Hb A1c Waived   TSH    Caryl Pina, MD [facility] 12/16/2020, 10:01 AM

## 2020-12-16 NOTE — Telephone Encounter (Signed)
Pt told nurse this morning that he used [facility] in [facility].  Pt informed that he had a year supply of his medications sent to them in Sept 2022.  Pt would like for all of his medications to be sent to [facility] instead.  One year supply sent to [facility].

## 2020-12-17 LAB — TSH: TSH: 3.53 u[IU]/mL (ref 0.450–4.500)

## 2020-12-21 DIAGNOSIS — G894 Chronic pain syndrome: Secondary | ICD-10-CM | POA: Diagnosis not present

## 2020-12-31 ENCOUNTER — Telehealth: Payer: Self-pay | Admitting: Family Medicine

## 2020-12-31 NOTE — Telephone Encounter (Signed)
°  Left message for patient to call back and schedule Medicare Annual Wellness Visit (AWV) to be completed by video or phone.  No hx of AWV eligible for AWVI as of 08/04/2011 per [employer]  Please schedule at anytime with [place] --- Karle Starch  45 Minutes appointment   Any questions, please call me at [phone]

## 2021-01-06 DIAGNOSIS — M545 Low back pain, unspecified: Secondary | ICD-10-CM | POA: Diagnosis not present

## 2021-01-06 DIAGNOSIS — X500XXA Overexertion from strenuous movement or load, initial encounter: Secondary | ICD-10-CM | POA: Diagnosis not present

## 2021-01-07 ENCOUNTER — Ambulatory Visit (INDEPENDENT_AMBULATORY_CARE_PROVIDER_SITE_OTHER): Payer: Medicare Other

## 2021-01-07 VITALS — Ht 71.0 in | Wt 240.0 lb

## 2021-01-07 DIAGNOSIS — Z0000 Encounter for general adult medical examination without abnormal findings: Secondary | ICD-10-CM | POA: Diagnosis not present

## 2021-01-07 NOTE — Progress Notes (Signed)
Subjective:   IAIN SAWCHUK is a 71 y.o. male who presents for an Initial Medicare Annual Wellness Visit.  Virtual Visit via Telephone Note  I connected with  [name] on 01/07/21 at  2:45 PM EST by telephone and verified that I am speaking with the correct person using two identifiers.  Location: Patient: Home Provider: [facility] Persons participating in the virtual visit: patient/Nurse Health Advisor   I discussed the limitations, risks, security and privacy concerns of performing an evaluation and management service by telephone and the availability of in person appointments. The patient expressed understanding and agreed to proceed.  Interactive audio and video telecommunications were attempted between this nurse and patient, however failed, due to patient having technical difficulties OR patient did not have access to video capability.  We continued and completed visit with audio only.  Some vital signs may be absent or patient reported.   Citlalic Norlander E Dariel Pellecchia, LPN   Review of Systems     Cardiac Risk Factors include: advanced age (>11mn, >>47women);male gender;obesity (BMI >30kg/m2);diabetes mellitus;dyslipidemia;Other (see comment), Risk factor comments: asbestos exposure     Objective:    Today's Vitals   01/07/21 1449 01/07/21 1450  Weight: 240 lb (108.9 kg)   Height: 5' 11"  (1.803 m)   PainSc:  4    Body mass index is 33.47 kg/m.  Advanced Directives 01/07/2021  Does Patient Have a Medical Advance Directive? No  Would patient like information on creating a medical advance directive? No - Patient declined    Current Medications (verified) Outpatient Encounter Medications as of 01/07/2021  Medication Sig   amLODipine (NORVASC) 10 MG tablet Take 1 tablet (10 mg total) by mouth daily.   atenolol (TENORMIN) 100 MG tablet Take 1 tablet (100 mg total) by mouth daily.   clobetasol cream (TEMOVATE) 02.44% Apply 1 application topically 2 (two) times daily.   levothyroxine  (SYNTHROID) 112 MCG tablet Take 1 tablet (112 mcg total) by mouth daily.   metFORMIN (GLUCOPHAGE) 500 MG tablet Take 2 tablets (1,000 mg total) by mouth 2 (two) times daily with a meal.   methadone (DOLOPHINE) 10 MG tablet Take 10 mg by mouth 2 (two) times daily.   SUMAtriptan (IMITREX) 100 MG tablet TAKE 1 TABLET DAILY AS NEEDED FOR HEADACHE, MAY REPEAT IN 2 HOURS IF NEEDED, NO MORE THAN 2 TABLETS PER DAY   blood glucose meter kit and supplies KIT Dispense based on patient and insurance preference. Use up to four times daily as directed. (FOR ICD-9 250.00, 250.01). (Patient not taking: Reported on 01/07/2021)   No facility-administered encounter medications on file as of 01/07/2021.    Allergies (verified) Darvocet [propoxyphene n-acetaminophen], Talwin [pentazocine], and Ultram [tramadol]   History: Past Medical History:  Diagnosis Date   Back pain    Hypertension    Past Surgical History:  Procedure Laterality Date   BACK SURGERY     ELBOW SURGERY     SHOULDER SURGERY     WRIST SURGERY     Family History  Problem Relation Age of Onset   Heart disease Mother    Heart disease Father    Diabetes Brother    Social History   Socioeconomic History   Marital status: Married    Spouse name: Not on file   Number of children: 2   Years of education: Not on file   Highest education level: Not on file  Occupational History   Occupation: retired  Tobacco Use   Smoking  status: Former    Packs/day: 0.25    Types: Cigarettes    Start date: 01/04/1967    Quit date: 01/03/1969    Years since quitting: 52.0   Smokeless tobacco: Current  Substance and Sexual Activity   Alcohol use: No   Drug use: No   Sexual activity: Not on file  Other Topics Concern   Not on file  Social History Narrative   Son living with them right now   Social Determinants of Health   Financial Resource Strain: Low Risk    Difficulty of Paying Living Expenses: Not hard at all  Food Insecurity: No Food  Insecurity   Worried About Charity fundraiser in the Last Year: Never true   [place] in the Last Year: Never true  Transportation Needs: No Transportation Needs   Lack of Transportation (Medical): No   Lack of Transportation (Non-Medical): No  Physical Activity: Sufficiently Active   Days of Exercise per Week: 7 days   Minutes of Exercise per Session: 30 min  Stress: No Stress Concern Present   Feeling of Stress : Not at all  Social Connections: Socially Integrated   Frequency of Communication with Friends and Family: More than three times a week   Frequency of Social Gatherings with Friends and Family: More than three times a week   Attends Religious Services: More than 4 times per year   Active Member of [employer] or Organizations: Yes   Attends Music therapist: More than 4 times per year   Marital Status: Married    Tobacco Counseling Ready to quit: Not Answered Counseling given: Not Answered   Clinical Intake:  Pre-visit preparation completed: Yes  Pain : 0-10 Pain Score: 4  Pain Type: Chronic pain Pain Location: Back Pain Orientation: Right, Left Pain Descriptors / Indicators: Aching, Discomfort, Sore Pain Onset: More than a month ago Pain Frequency: Intermittent     BMI - recorded: 33.47 Nutritional Status: BMI > 30  Obese Nutritional Risks: None Diabetes: Yes CBG done?: No Did pt. bring in CBG monitor from home?: No  How often do you need to have someone help you when you read instructions, pamphlets, or other written materials from your doctor or pharmacy?: 1 - Never  Diabetic? Nutrition Risk Assessment:  Has the patient had any N/V/D within the last 2 months?  No  Does the patient have any non-healing wounds?  No  Has the patient had any unintentional weight loss or weight gain?  No   Diabetes:  Is the patient diabetic?  Yes  If diabetic, was a CBG obtained today?  No  Did the patient bring in their glucometer from home?  No  How  often do you monitor your CBG's? never.   Financial Strains and Diabetes Management:  Are you having any financial strains with the device, your supplies or your medication? No .  Does the patient want to be seen by Chronic Care Management for management of their diabetes?  No  Would the patient like to be referred to a Nutritionist or for Diabetic Management?  No   Diabetic Exams:  Diabetic Eye Exam: Completed 06/10/2020.   Diabetic Foot Exam: Completed 06/11/2020. Pt has been advised about the importance in completing this exam. Pt is scheduled for diabetic foot exam on 03/18/2021.    Interpreter Needed?: No  Information entered by :: Armstrong Creasy, LPN   Activities of Daily Living In your present state of health, do you have any  difficulty performing the following activities: 01/07/2021  Hearing? Y  Comment mild  Vision? N  Difficulty concentrating or making decisions? N  Walking or climbing stairs? N  Dressing or bathing? N  Doing errands, shopping? N  Preparing Food and eating ? N  Using the Toilet? N  In the past six months, have you accidently leaked urine? N  Do you have problems with loss of bowel control? N  Managing your Medications? N  Managing your Finances? N  Housekeeping or managing your Housekeeping? N  Some recent data might be hidden    Patient Care Team: Dettinger, Fransisca Kaufmann, MD as PCP - General (Family Medicine)  Indicate any recent Medical Services you may have received from other than [facility] providers in the past year (date may be approximate).     Assessment:   This is a routine wellness examination for Jermiah.  Hearing/Vision screen Hearing Screening - Comments:: C/o mild hearing difficulties - declines hearing aids  Vision Screening - Comments:: Declines vision difficulties - up to date with annual eye exams with [facility]  Dietary issues and exercise activities discussed: Current Exercise Habits: Home exercise routine, Type of exercise:  walking;Other - see comments (outdoor work), Time (Minutes): 30, Frequency (Times/Week): 7, Weekly Exercise (Minutes/Week): 210, Intensity: Mild, Exercise limited by: None identified   Goals Addressed             This Visit's Progress    HEMOGLOBIN A1C < 7         Depression Screen PHQ 2/9 Scores 01/07/2021 12/16/2020 09/15/2020 06/11/2020 06/11/2020 03/05/2020 12/11/2019  PHQ - 2 Score 0 0 0 0 0 0 0    Fall Risk Fall Risk  01/07/2021 12/16/2020 09/15/2020 06/11/2020 06/11/2020  Falls in the past year? 0 0 0 0 0  Number falls in past yr: 0 - - - -  Injury with Fall? 0 - - - -  Risk for fall due to : Medication side effect;Orthopedic patient;Impaired balance/gait - - - -  Follow up Falls prevention discussed - - - -    FALL RISK PREVENTION PERTAINING TO THE HOME:  Any stairs in or around the home? Yes  If so, are there any without handrails? No  Home free of loose throw rugs in walkways, pet beds, electrical cords, etc? Yes  Adequate lighting in your home to reduce risk of falls? Yes   ASSISTIVE DEVICES UTILIZED TO PREVENT FALLS:  Life alert? No  Use of a cane, walker or w/c? Yes  Grab bars in the bathroom? No  Shower chair or bench in shower? No  Elevated toilet seat or a handicapped toilet? No   TIMED UP AND GO:  Was the test performed? No . Telephonic visit  Cognitive Function:     6CIT Screen 01/07/2021  What Year? 0 points  What month? 0 points  What time? 0 points  Count back from 20 0 points  Months in reverse 0 points  Repeat phrase 6 points  Total Score 6    Immunizations Immunization History  Administered Date(s) Administered   Influenza Split 11/22/2012   Pneumococcal Polysaccharide-23 10/05/2017   Td 05/03/2005   Zoster, Live 03/25/2014    TDAP status: Due, Education has been provided regarding the importance of this vaccine. Advised may receive this vaccine at local pharmacy or Health Dept. Aware to provide a copy of the vaccination record if obtained from  local pharmacy or Health Dept. Verbalized acceptance and understanding.  Flu Vaccine status: Declined, Education has  been provided regarding the importance of this vaccine but patient still declined. Advised may receive this vaccine at local pharmacy or Health Dept. Aware to provide a copy of the vaccination record if obtained from local pharmacy or Health Dept. Verbalized acceptance and understanding.  Pneumococcal vaccine status: Declined,  Education has been provided regarding the importance of this vaccine but patient still declined. Advised may receive this vaccine at local pharmacy or Health Dept. Aware to provide a copy of the vaccination record if obtained from local pharmacy or Health Dept. Verbalized acceptance and understanding.   Covid-19 vaccine status: Declined, Education has been provided regarding the importance of this vaccine but patient still declined. Advised may receive this vaccine at local pharmacy or Health Dept.or vaccine clinic. Aware to provide a copy of the vaccination record if obtained from local pharmacy or Health Dept. Verbalized acceptance and understanding.  Qualifies for Shingles Vaccine? Yes   Zostavax completed Yes   Shingrix Completed?: No.    Education has been provided regarding the importance of this vaccine. Patient has been advised to call insurance company to determine out of pocket expense if they have not yet received this vaccine. Advised may also receive vaccine at local pharmacy or Health Dept. Verbalized acceptance and understanding.  Screening Tests Health Maintenance  Topic Date Due   URINE MICROALBUMIN  03/05/2021   COLONOSCOPY (Pts 45-33yr Insurance coverage will need to be confirmed)  03/08/2021 (Originally 09/20/2020)   TETANUS/TDAP  09/15/2021 (Originally 05/04/2015)   INFLUENZA VACCINE  09/20/2021 (Originally 08/03/2020)   OPHTHALMOLOGY EXAM  09/20/2021 (Originally 06/10/2020)   Zoster Vaccines- Shingrix (1 of 2) 09/20/2021 (Originally 07/06/2001)    Pneumonia Vaccine 70 Years old (2 - PCV) 12/16/2021 (Originally 10/06/2018)   FOOT EXAM  06/11/2021   HEMOGLOBIN A1C  06/16/2021   Hepatitis C Screening  Completed   HPV VACCINES  Aged Out   COVID-19 Vaccine  Discontinued    Health Maintenance  Health Maintenance Due  Topic Date Due   URINE MICROALBUMIN  03/05/2021    Colorectal cancer screening: Type of screening: Colonoscopy. Completed 09/21/2015. Repeat every 5 years he will call [facility] soon  Lung Cancer Screening: (Low Dose CT Chest recommended if Age 70-80years, 30 pack-year currently smoking OR have quit w/in 15years.) does not qualify.   Additional Screening:  Hepatitis C Screening: does qualify; Completed 09/29/2015  Vision Screening: Recommended annual ophthalmology exams for early detection of glaucoma and other disorders of the eye. Is the patient up to date with their annual eye exam?  Yes  Who is the provider or what is the name of the office in which the patient attends annual eye exams[place] is not established with a provider, would they like to be referred to a provider to establish care? No .   Dental Screening: Recommended annual dental exams for proper oral hygiene  Community Resource Referral / Chronic Care Management: CRR required this visit?  No   CCM required this visit?  No      Plan:     I have personally reviewed and noted the following in the patients chart:   Medical and social history Use of alcohol, tobacco or illicit drugs  Current medications and supplements including opioid prescriptions. Patient is currently taking opioid prescriptions. Information provided to patient regarding non-opioid alternatives. Patient advised to discuss non-opioid treatment plan with their provider. Functional ability and status Nutritional status Physical activity Advanced directives List of other physicians Hospitalizations, surgeries, and ER visits in  previous 12  months Vitals Screenings to include cognitive, depression, and falls Referrals and appointments  In addition, I have reviewed and discussed with patient certain preventive protocols, quality metrics, and best practice recommendations. A written personalized care plan for preventive services as well as general preventive health recommendations were provided to patient.     Sandrea Hammond, LPN   01/12/6220   Nurse Notes: None

## 2021-01-07 NOTE — Patient Instructions (Signed)
Stephen Lawson , Thank you for taking time to come for your Medicare Wellness Visit. I appreciate your ongoing commitment to your health goals. Please review the following plan we discussed and let me know if I can assist you in the future.   Screening recommendations/referrals: Colonoscopy: Done 09/21/2015 - Repeat in 5 years *call Dr Perley Jain office to make appointment soon Recommended yearly ophthalmology/optometry visit for glaucoma screening and checkup Recommended yearly dental visit for hygiene and checkup  Vaccinations: Influenza vaccine: Declined Pneumococcal vaccine: Done 10/05/2017 - Declined Prevnar Tdap vaccine: Done 05/03/2005 - Repeat in 10 years *Declined Shingles vaccine: Zostavax done 2016 - Declined Shingrix   Covid-19: Declined  Advanced directives: Advance directive discussed with you today. Even though you declined this today, please call our office should you change your mind, and we can give you the proper paperwork for you to fill out.   Conditions/risks identified: Aim for 30 minutes of exercise or brisk walking each day, drink 6-8 glasses of water and eat lots of fruits and vegetables.   Next appointment: Follow up in one year for your annual wellness visit.   Preventive Care 7 Years and Older, Male  Preventive care refers to lifestyle choices and visits with your health care provider that can promote health and wellness. What does preventive care include? A yearly physical exam. This is also called an annual well check. Dental exams once or twice a year. Routine eye exams. Ask your health care provider how often you should have your eyes checked. Personal lifestyle choices, including: Daily care of your teeth and gums. Regular physical activity. Eating a healthy diet. Avoiding tobacco and drug use. Limiting alcohol use. Practicing safe sex. Taking low doses of aspirin every day. Taking vitamin and mineral supplements as recommended by your health care  provider. What happens during an annual well check? The services and screenings done by your health care provider during your annual well check will depend on your age, overall health, lifestyle risk factors, and family history of disease. Counseling  Your health care provider may ask you questions about your: Alcohol use. Tobacco use. Drug use. Emotional well-being. Home and relationship well-being. Sexual activity. Eating habits. History of falls. Memory and ability to understand (cognition). Work and work Statistician. Screening  You may have the following tests or measurements: Height, weight, and BMI. Blood pressure. Lipid and cholesterol levels. These may be checked every 5 years, or more frequently if you are over 66 years old. Skin check. Lung cancer screening. You may have this screening every year starting at age 61 if you have a 30-pack-year history of smoking and currently smoke or have quit within the past 15 years. Fecal occult blood test (FOBT) of the stool. You may have this test every year starting at age 43. Flexible sigmoidoscopy or colonoscopy. You may have a sigmoidoscopy every 5 years or a colonoscopy every 10 years starting at age 33. Prostate cancer screening. Recommendations will vary depending on your family history and other risks. Hepatitis C blood test. Hepatitis B blood test. Sexually transmitted disease (STD) testing. Diabetes screening. This is done by checking your blood sugar (glucose) after you have not eaten for a while (fasting). You may have this done every 1-3 years. Abdominal aortic aneurysm (AAA) screening. You may need this if you are a current or former smoker. Osteoporosis. You may be screened starting at age 37 if you are at high risk. Talk with your health care provider about your test results, treatment options,  and if necessary, the need for more tests. Vaccines  Your health care provider may recommend certain vaccines, such  as: Influenza vaccine. This is recommended every year. Tetanus, diphtheria, and acellular pertussis (Tdap, Td) vaccine. You may need a Td booster every 10 years. Zoster vaccine. You may need this after age 37. Pneumococcal 13-valent conjugate (PCV13) vaccine. One dose is recommended after age 61. Pneumococcal polysaccharide (PPSV23) vaccine. One dose is recommended after age 43. Talk to your health care provider about which screenings and vaccines you need and how often you need them. This information is not intended to replace advice given to you by your health care provider. Make sure you discuss any questions you have with your health care provider. Document Released: 01/16/2015 Document Revised: 09/09/2015 Document Reviewed: 10/21/2014 Elsevier Interactive Patient Education  2017 [place] Prevention in the Home Falls can cause injuries. They can happen to people of all ages. There are many things you can do to make your home safe and to help prevent falls. What can I do on the outside of my home? Regularly fix the edges of walkways and driveways and fix any cracks. Remove anything that might make you trip as you walk through a door, such as a raised step or threshold. Trim any bushes or trees on the path to your home. Use bright outdoor lighting. Clear any walking paths of anything that might make someone trip, such as rocks or tools. Regularly check to see if handrails are loose or broken. Make sure that both sides of any steps have handrails. Any raised decks and porches should have guardrails on the edges. Have any leaves, snow, or ice cleared regularly. Use sand or salt on walking paths during winter. Clean up any spills in your garage right away. This includes oil or grease spills. What can I do in the bathroom? Use night lights. Install grab bars by the toilet and in the tub and shower. Do not use towel bars as grab bars. Use non-skid mats or decals in the tub or  shower. If you need to sit down in the shower, use a plastic, non-slip stool. Keep the floor dry. Clean up any water that spills on the floor as soon as it happens. Remove soap buildup in the tub or shower regularly. Attach bath mats securely with double-sided non-slip rug tape. Do not have throw rugs and other things on the floor that can make you trip. What can I do in the bedroom? Use night lights. Make sure that you have a light by your bed that is easy to reach. Do not use any sheets or blankets that are too big for your bed. They should not hang down onto the floor. Have a firm chair that has side arms. You can use this for support while you get dressed. Do not have throw rugs and other things on the floor that can make you trip. What can I do in the kitchen? Clean up any spills right away. Avoid walking on wet floors. Keep items that you use a lot in easy-to-reach places. If you need to reach something above you, use a strong step stool that has a grab bar. Keep electrical cords out of the way. Do not use floor polish or wax that makes floors slippery. If you must use wax, use non-skid floor wax. Do not have throw rugs and other things on the floor that can make you trip. What can I do with my stairs? Do not leave  any items on the stairs. Make sure that there are handrails on both sides of the stairs and use them. Fix handrails that are broken or loose. Make sure that handrails are as long as the stairways. Check any carpeting to make sure that it is firmly attached to the stairs. Fix any carpet that is loose or worn. Avoid having throw rugs at the top or bottom of the stairs. If you do have throw rugs, attach them to the floor with carpet tape. Make sure that you have a light switch at the top of the stairs and the bottom of the stairs. If you do not have them, ask someone to add them for you. What else can I do to help prevent falls? Wear shoes that: Do not have high heels. Have  rubber bottoms. Are comfortable and fit you well. Are closed at the toe. Do not wear sandals. If you use a stepladder: Make sure that it is fully opened. Do not climb a closed stepladder. Make sure that both sides of the stepladder are locked into place. Ask someone to hold it for you, if possible. Clearly mark and make sure that you can see: Any grab bars or handrails. First and last steps. Where the edge of each step is. Use tools that help you move around (mobility aids) if they are needed. These include: Canes. Walkers. Scooters. Crutches. Turn on the lights when you go into a dark area. Replace any light bulbs as soon as they burn out. Set up your furniture so you have a clear path. Avoid moving your furniture around. If any of your floors are uneven, fix them. If there are any pets around you, be aware of where they are. Review your medicines with your doctor. Some medicines can make you feel dizzy. This can increase your chance of falling. Ask your doctor what other things that you can do to help prevent falls. This information is not intended to replace advice given to you by your health care provider. Make sure you discuss any questions you have with your health care provider. Document Released: 10/16/2008 Document Revised: 05/28/2015 Document Reviewed: 01/24/2014 Elsevier Interactive Patient Education  2017 [employer].

## 2021-02-24 DIAGNOSIS — M25552 Pain in left hip: Secondary | ICD-10-CM | POA: Diagnosis not present

## 2021-02-24 DIAGNOSIS — M545 Low back pain, unspecified: Secondary | ICD-10-CM | POA: Diagnosis not present

## 2021-03-18 ENCOUNTER — Encounter: Payer: Self-pay | Admitting: Family Medicine

## 2021-03-18 ENCOUNTER — Ambulatory Visit (INDEPENDENT_AMBULATORY_CARE_PROVIDER_SITE_OTHER): Payer: Medicare Other | Admitting: Family Medicine

## 2021-03-18 VITALS — BP 125/60 | HR 68 | Ht 71.0 in | Wt 241.0 lb

## 2021-03-18 DIAGNOSIS — E1159 Type 2 diabetes mellitus with other circulatory complications: Secondary | ICD-10-CM | POA: Diagnosis not present

## 2021-03-18 DIAGNOSIS — E1142 Type 2 diabetes mellitus with diabetic polyneuropathy: Secondary | ICD-10-CM

## 2021-03-18 DIAGNOSIS — Z125 Encounter for screening for malignant neoplasm of prostate: Secondary | ICD-10-CM | POA: Diagnosis not present

## 2021-03-18 DIAGNOSIS — E039 Hypothyroidism, unspecified: Secondary | ICD-10-CM

## 2021-03-18 DIAGNOSIS — I152 Hypertension secondary to endocrine disorders: Secondary | ICD-10-CM | POA: Diagnosis not present

## 2021-03-18 LAB — BAYER DCA HB A1C WAIVED: HB A1C (BAYER DCA - WAIVED): 7.5 % — ABNORMAL HIGH (ref 4.8–5.6)

## 2021-03-18 MED ORDER — GLIMEPIRIDE 2 MG PO TABS: 2.0000 mg | ORAL_TABLET | Freq: Every day | ORAL | 1 refills | Status: DC

## 2021-03-18 MED ORDER — GLIMEPIRIDE 2 MG PO TABS
2.0000 mg | ORAL_TABLET | Freq: Every day | ORAL | 1 refills | Status: DC
Start: 2021-03-18 — End: 2021-03-18

## 2021-03-18 NOTE — Addendum Note (Signed)
Addended by: Alphonzo Dublin on: 03/18/2021 09:32 AM ? ? Modules accepted: Orders ? ?

## 2021-03-18 NOTE — Progress Notes (Signed)
? ?BP 125/60   Pulse 68   Ht 5' 11"  (1.803 m)   Wt 241 lb (109.3 kg)   SpO2 96%   BMI 33.61 kg/m?   ? ?Subjective:  ? ?Patient ID: Stephen Lawson, male    DOB: 1950/04/02, 71 y.o.   MRN: [id number] ? ?HPI: ?Stephen Lawson is a 71 y.o. male presenting on 03/18/2021 for Medical Management of Chronic Issues, Diabetes, Hyperlipidemia, Hypertension, and Hypothyroidism ? ? ?HPI ?Type 2 diabetes mellitus ?Patient comes in today for recheck of his diabetes. Patient has been currently taking metformin. Patient is not currently on an ACE inhibitor/ARB. Patient has not seen an ophthalmologist this year. Patient denies any issues with their feet. The symptom started onset as an adult hypertension and thyroid ARE RELATED TO DM  ? ?Hypertension ?Patient is currently on amlodipine and atenolol, and their blood pressure today is 125/60. Patient denies any lightheadedness or dizziness. Patient denies headaches, blurred vision, chest pains, shortness of breath, or weakness. Denies any side effects from medication and is content with current medication.  ? ?Hypothyroidism recheck ?Patient is coming in for thyroid recheck today as well. They deny any issues with hair changes or heat or cold problems or diarrhea or constipation. They deny any chest pain or palpitations. They are currently on levothyroxine 112 micrograms  ? ?Relevant past medical, surgical, family and social history reviewed and updated as indicated. Interim medical history since our last visit reviewed. ?Allergies and medications reviewed and updated. ? ?Review of Systems  ?Constitutional:  Negative for chills and fever.  ?Eyes:  Negative for visual disturbance.  ?Respiratory:  Negative for shortness of breath and wheezing.   ?Cardiovascular:  Negative for chest pain and leg swelling.  ?Musculoskeletal:  Positive for back pain. Negative for gait problem.  ?Skin:  Negative for rash.  ?Neurological:  Negative for dizziness, weakness and light-headedness.  ?All other systems  reviewed and are negative. ? ?Per HPI unless specifically indicated above ? ? ?Allergies as of 03/18/2021   ? ?   Reactions  ? Darvocet [propoxyphene N-acetaminophen]   ? Itching  ? Talwin [pentazocine]   ? itching  ? Ultram [tramadol]   ? ?  ? ?  ?Medication List  ?  ? ?  ? Accurate as of March 18, 2021  9:24 AM. If you have any questions, ask your nurse or doctor.  ?  ?  ? ?  ? ?amLODipine 10 MG tablet ?Commonly known as: NORVASC ?Take 1 tablet (10 mg total) by mouth daily. ?  ?atenolol 100 MG tablet ?Commonly known as: TENORMIN ?Take 1 tablet (100 mg total) by mouth daily. ?  ?blood glucose meter kit and supplies Kit ?Dispense based on patient and insurance preference. Use up to four times daily as directed. (FOR ICD-9 250.00, 250.01). ?  ?clobetasol cream 0.05 % ?Commonly known as: TEMOVATE ?Apply 1 application topically 2 (two) times daily. ?  ?glimepiride 2 MG tablet ?Commonly known as: AMARYL ?Take 1 tablet (2 mg total) by mouth daily before breakfast. ?Started by: Worthy Rancher, MD ?  ?levothyroxine 112 MCG tablet ?Commonly known as: SYNTHROID ?Take 1 tablet (112 mcg total) by mouth daily. ?  ?metFORMIN 500 MG tablet ?Commonly known as: GLUCOPHAGE ?Take 2 tablets (1,000 mg total) by mouth 2 (two) times daily with a meal. ?  ?methadone 10 MG tablet ?Commonly known as: DOLOPHINE ?Take 10 mg by mouth 2 (two) times daily. ?  ?SUMAtriptan 100 MG tablet ?Commonly known as: IMITREX ?TAKE  1 TABLET DAILY AS NEEDED FOR HEADACHE, MAY REPEAT IN 2 HOURS IF NEEDED, NO MORE THAN 2 TABLETS PER DAY ?  ? ?  ? ? ? ?Objective:  ? ?BP 125/60   Pulse 68   Ht 5' 11"  (1.803 m)   Wt 241 lb (109.3 kg)   SpO2 96%   BMI 33.61 kg/m?   ?Wt Readings from Last 3 Encounters:  ?03/18/21 241 lb (109.3 kg)  ?01/07/21 240 lb (108.9 kg)  ?12/16/20 240 lb (108.9 kg)  ?  ?Physical Exam ?Vitals and nursing note reviewed.  ?Constitutional:   ?   General: He is not in acute distress. ?   Appearance: He is well-developed. He is not  diaphoretic.  ?Eyes:  ?   General: No scleral icterus. ?   Conjunctiva/sclera: Conjunctivae normal.  ?Neck:  ?   Thyroid: No thyromegaly.  ?Cardiovascular:  ?   Rate and Rhythm: Normal rate and regular rhythm.  ?   Heart sounds: Normal heart sounds. No murmur heard. ?Pulmonary:  ?   Effort: Pulmonary effort is normal. No respiratory distress.  ?   Breath sounds: Normal breath sounds. No wheezing.  ?Musculoskeletal:     ?   General: Normal range of motion.  ?   Cervical back: Neck supple.  ?Lymphadenopathy:  ?   Cervical: No cervical adenopathy.  ?Skin: ?   General: Skin is warm and dry.  ?   Findings: No rash.  ?Neurological:  ?   Mental Status: He is alert and oriented to person, place, and time.  ?   Coordination: Coordination normal.  ?Psychiatric:     ?   Behavior: Behavior normal.  ? ? ? ? ?Assessment & Plan:  ? ?Problem List Items Addressed This Visit   ? ?  ? Cardiovascular and Mediastinum  ? Hypertension associated with diabetes ([place])  ? Relevant Medications  ? glimepiride (AMARYL) 2 MG tablet  ? Other Relevant Orders  ? CBC with Differential/Platelet  ? CMP14+EGFR  ? Lipid panel  ? Bayer DCA Hb A1c Waived  ? TSH  ?  ? Endocrine  ? T2DM (type 2 diabetes mellitus) ([place]) - Primary  ? Relevant Medications  ? glimepiride (AMARYL) 2 MG tablet  ? Other Relevant Orders  ? CBC with Differential/Platelet  ? CMP14+EGFR  ? Lipid panel  ? Bayer DCA Hb A1c Waived  ? TSH  ? Microalbumin / creatinine urine ratio  ? Hypothyroid  ? Relevant Orders  ? CBC with Differential/Platelet  ? CMP14+EGFR  ? Lipid panel  ? Bayer DCA Hb A1c Waived  ? TSH  ? ?Other Visit Diagnoses   ? ? Prostate cancer screening      ? Relevant Orders  ? PSA, total and free  ? ?  ?  ?A1c is 7.5, slightly up from 7.4.   ?Follow up plan: ?Return in about 3 months (around 06/18/2021), or if symptoms worsen or fail to improve, for Diabetes recheck. ? ?Counseling provided for all of the vaccine components ?Orders Placed This Encounter  ?Procedures  ? CBC with  Differential/Platelet  ? CMP14+EGFR  ? Lipid panel  ? Bayer DCA Hb A1c Waived  ? TSH  ? Microalbumin / creatinine urine ratio  ? PSA, total and free  ? ? ?Caryl Pina, MD ?[place] ?03/18/2021, 9:24 AM ? ? ?  ?

## 2021-03-19 LAB — CMP14+EGFR
ALT: 21 IU/L (ref 0–44)
AST: 16 IU/L (ref 0–40)
Albumin/Globulin Ratio: 1.6 (ref 1.2–2.2)
Albumin: 4.4 g/dL (ref 3.8–4.8)
Alkaline Phosphatase: 96 IU/L (ref 44–121)
BUN/Creatinine Ratio: 15 (ref 10–24)
BUN: 16 mg/dL (ref 8–27)
Bilirubin Total: 0.3 mg/dL (ref 0.0–1.2)
CO2: 29 mmol/L (ref 20–29)
Calcium: 9.5 mg/dL (ref 8.6–10.2)
Chloride: 101 mmol/L (ref 96–106)
Creatinine, Ser: 1.09 mg/dL (ref 0.76–1.27)
Globulin, Total: 2.7 g/dL (ref 1.5–4.5)
Glucose: 152 mg/dL — ABNORMAL HIGH (ref 70–99)
Potassium: 4.9 mmol/L (ref 3.5–5.2)
Sodium: 140 mmol/L (ref 134–144)
Total Protein: 7.1 g/dL (ref 6.0–8.5)
eGFR: 73 mL/min/{1.73_m2} (ref >59)

## 2021-03-19 LAB — CBC WITH DIFFERENTIAL/PLATELET
Basophils Absolute: 0.1 10*3/uL (ref 0.0–0.2)
Basos: 1 %
EOS (ABSOLUTE): 0.2 10*3/uL (ref 0.0–0.4)
Eos: 2 %
Hematocrit: 44.1 % (ref 37.5–51.0)
Hemoglobin: 14.2 g/dL (ref 13.0–17.7)
Immature Grans (Abs): 0 10*3/uL (ref 0.0–0.1)
Immature Granulocytes: 0 %
Lymphocytes Absolute: 2.2 10*3/uL (ref 0.7–3.1)
Lymphs: 26 %
MCH: 26.3 pg — ABNORMAL LOW (ref 26.6–33.0)
MCHC: 32.2 g/dL (ref 31.5–35.7)
MCV: 82 fL (ref 79–97)
Monocytes Absolute: 0.7 10*3/uL (ref 0.1–0.9)
Monocytes: 8 %
Neutrophils Absolute: 5.4 10*3/uL (ref 1.4–7.0)
Neutrophils: 63 %
Platelets: 296 10*3/uL (ref 150–450)
RBC: 5.4 x10E6/uL (ref 4.14–5.80)
RDW: 13.5 % (ref 11.6–15.4)
WBC: 8.6 10*3/uL (ref 3.4–10.8)

## 2021-03-19 LAB — LIPID PANEL
Chol/HDL Ratio: 3.5 ratio (ref 0.0–5.0)
Cholesterol, Total: 135 mg/dL (ref 100–199)
HDL: 39 mg/dL — ABNORMAL LOW (ref >39)
LDL Chol Calc (NIH): 86 mg/dL (ref 0–99)
Triglycerides: 45 mg/dL (ref 0–149)
VLDL Cholesterol Cal: 10 mg/dL (ref 5–40)

## 2021-03-19 LAB — MICROALBUMIN / CREATININE URINE RATIO
Creatinine, Urine: 122.2 mg/dL
Microalb/Creat Ratio: 15 mg/g creat (ref 0–29)
Microalbumin, Urine: 18.9 ug/mL

## 2021-03-19 LAB — TSH: TSH: 2.64 u[IU]/mL (ref 0.450–4.500)

## 2021-04-19 DIAGNOSIS — M545 Low back pain, unspecified: Secondary | ICD-10-CM | POA: Diagnosis not present

## 2021-04-19 DIAGNOSIS — M25552 Pain in left hip: Secondary | ICD-10-CM | POA: Diagnosis not present

## 2021-05-03 DIAGNOSIS — M545 Low back pain, unspecified: Secondary | ICD-10-CM | POA: Diagnosis not present

## 2021-05-03 DIAGNOSIS — M25552 Pain in left hip: Secondary | ICD-10-CM | POA: Diagnosis not present

## 2021-05-25 DIAGNOSIS — Z86010 Personal history of colonic polyps: Secondary | ICD-10-CM | POA: Diagnosis not present

## 2021-05-25 DIAGNOSIS — K5900 Constipation, unspecified: Secondary | ICD-10-CM | POA: Diagnosis not present

## 2021-06-03 ENCOUNTER — Emergency Department (INDEPENDENT_AMBULATORY_CARE_PROVIDER_SITE_OTHER)
Admission: EM | Admit: 2021-06-03 | Discharge: 2021-06-03 | Disposition: A | Discharge status: Home / Self Care | Payer: Medicare Other | Source: Home / Self Care

## 2021-06-03 ENCOUNTER — Emergency Department (INDEPENDENT_AMBULATORY_CARE_PROVIDER_SITE_OTHER): Payer: Medicare Other

## 2021-06-03 DIAGNOSIS — R109 Unspecified abdominal pain: Secondary | ICD-10-CM

## 2021-06-03 DIAGNOSIS — K5909 Other constipation: Secondary | ICD-10-CM

## 2021-06-03 DIAGNOSIS — N281 Cyst of kidney, acquired: Secondary | ICD-10-CM | POA: Diagnosis not present

## 2021-06-03 LAB — POCT URINALYSIS DIP (MANUAL ENTRY)
Bilirubin, UA: NEGATIVE
Blood, UA: NEGATIVE
Glucose, UA: NEGATIVE mg/dL
Ketones, POC UA: NEGATIVE mg/dL
Leukocytes, UA: NEGATIVE
Nitrite, UA: NEGATIVE
Protein Ur, POC: NEGATIVE mg/dL
Spec Grav, UA: >=1.03 — AB (ref 1.010–1.025)
Urobilinogen, UA: 0.2 E.U./dL
pH, UA: 5.5 (ref 5.0–8.0)

## 2021-06-03 NOTE — Discharge Instructions (Addendum)
Advised patient of CT of abdomen and pelvis today with hard copy provided to patient.  Advised patient to follow-up with PCP for further evaluation of bilateral renal cyst.  Encouraged patient to increase daily water intake to 64 ounces per day and to increase daily fiber intake to 80 mg/day.

## 2021-06-03 NOTE — ED Triage Notes (Addendum)
Pt c/o RT sided pain x 3 weeks. Worsening in last 18 hours. No hx of kidney stones. States hes only having a BM every 4 days.

## 2021-06-03 NOTE — ED Provider Notes (Signed)
[name]    CSN: [id number] Arrival date & time: 06/03/21  0849      History   Chief Complaint Chief Complaint  Patient presents with   Flank Pain    RT    HPI Stephen Lawson is a 71 y.o. male.   HPI Pleasant 71 year old male presents with right-sided flank/back pain x 3 weeks, worsening in the past 18 hours.  Reports no history of kidney stone.  PMH significant for chronic back pain, T2DM, and HTN associated with T2DM.  Patiently is currently on methadone and works having bowel movement every 4 days.  Specific gravity at triage reveals dehydration.  Patient has been evaluated/treated by PCP for chronic constipation.  Past Medical History:  Diagnosis Date   Back pain    Hypertension     Patient Active Problem List   Diagnosis Date Noted   Hearing loss of both ears due to cerumen impaction 05/11/2017   Migraine headache 11/22/2016   Hypothyroid 11/22/2016   T2DM (type 2 diabetes mellitus) ([place]) 10/02/2015   Hypertension associated with diabetes ([place]) 10/02/2015   Chronic back pain 06/29/2015   Allergic rhinitis 02/25/2013   Dysphagia, unspecified(787.20) 01/22/2013   Fatty liver 12/06/2012   H/O asbestos exposure 12/06/2012   Edema 12/06/2012    Past Surgical History:  Procedure Laterality Date   BACK SURGERY     ELBOW SURGERY     SHOULDER SURGERY     WRIST SURGERY         Home Medications    Prior to Admission medications   Medication Sig Start Date End Date Taking? Authorizing Provider  amLODipine (NORVASC) 10 MG tablet Take 1 tablet (10 mg total) by mouth daily. 12/16/20   Dettinger, Fransisca Kaufmann, MD  atenolol (TENORMIN) 100 MG tablet Take 1 tablet (100 mg total) by mouth daily. 12/16/20   Dettinger, Fransisca Kaufmann, MD  blood glucose meter kit and supplies KIT Dispense based on patient and insurance preference. Use up to four times daily as directed. (FOR ICD-9 250.00, 250.01). 07/01/14   Claretta Fraise, MD  clobetasol cream (TEMOVATE) 2.45 % Apply 1  application topically 2 (two) times daily. 09/19/13   Lysbeth Penner, FNP  glimepiride (AMARYL) 2 MG tablet Take 1 tablet (2 mg total) by mouth daily before breakfast. 03/18/21   Dettinger, Fransisca Kaufmann, MD  levothyroxine (SYNTHROID) 112 MCG tablet Take 1 tablet (112 mcg total) by mouth daily. 12/16/20   Dettinger, Fransisca Kaufmann, MD  metFORMIN (GLUCOPHAGE) 500 MG tablet Take 2 tablets (1,000 mg total) by mouth 2 (two) times daily with a meal. 12/16/20   Dettinger, Fransisca Kaufmann, MD  methadone (DOLOPHINE) 10 MG tablet Take 10 mg by mouth 2 (two) times daily.    [provider]  SUMAtriptan (IMITREX) 100 MG tablet TAKE 1 TABLET DAILY AS NEEDED FOR HEADACHE, MAY REPEAT IN 2 HOURS IF NEEDED, NO MORE THAN 2 TABLETS PER DAY 10/19/20   Dettinger, Fransisca Kaufmann, MD    Family History Family History  Problem Relation Age of Onset   Heart disease Mother    Heart disease Father    Diabetes Brother     Social History Social History   Tobacco Use   Smoking status: Former    Packs/day: 0.25    Types: Cigarettes    Start date: 01/04/1967    Quit date: 01/03/1969    Years since quitting: 52.4   Smokeless tobacco: Current  Substance Use Topics   Alcohol use: No   Drug  use: No     Allergies   Darvocet [propoxyphene n-acetaminophen], Talwin [pentazocine], and Ultram [tramadol]   Review of Systems Review of Systems  Musculoskeletal:        Right-sided pain x3 weeks  All other systems reviewed and are negative.   Physical Exam Triage Vital Signs ED Triage Vitals  Enc Vitals Group     BP 06/03/21 0857 (!) 183/93     Pulse Rate 06/03/21 0857 (!) 59     Resp 06/03/21 0857 18     Temp 06/03/21 0857 98 F (36.7 C)     Temp Source 06/03/21 0857 Oral     SpO2 06/03/21 0857 98 %     Weight --      Height --      Head Circumference --      Peak Flow --      Pain Score 06/03/21 0858 8     Pain Loc --      Pain Edu? --      Excl. in [place]? --    No data found.  Updated Vital Signs BP (!) 162/83 (BP  Location: Right Arm)   Pulse (!) 59   Temp 98 F (36.7 C) (Oral)   Resp 18   SpO2 98%       Physical Exam Vitals and nursing note reviewed.  Constitutional:      General: He is not in acute distress.    Appearance: Normal appearance. He is obese. He is not ill-appearing.  HENT:     Head: Normocephalic and atraumatic.     Mouth/Throat:     Mouth: Mucous membranes are moist.     Pharynx: Oropharynx is clear.  Eyes:     Extraocular Movements: Extraocular movements intact.     Conjunctiva/sclera: Conjunctivae normal.     Pupils: Pupils are equal, round, and reactive to light.  Cardiovascular:     Rate and Rhythm: Normal rate and regular rhythm.     Pulses: Normal pulses.     Heart sounds: Normal heart sounds.  Pulmonary:     Effort: Pulmonary effort is normal.     Breath sounds: Normal breath sounds. No wheezing, rhonchi or rales.  Abdominal:     General: There is distension.     Palpations: Abdomen is soft.     Tenderness: There is right CVA tenderness.     Hernia: No hernia is present.     Comments: Obese abdomen, hypoactive bowel sounds x4 quads, no hepatosplenomegaly.  Musculoskeletal:     Cervical back: Normal range of motion and neck supple.  Skin:    General: Skin is warm and dry.  Neurological:     General: No focal deficit present.     Mental Status: He is alert and oriented to person, place, and time. Mental status is at baseline.     UC Treatments / Results  Labs (all labs ordered are listed, but only abnormal results are displayed) Labs Reviewed  POCT URINALYSIS DIP (MANUAL ENTRY) - Abnormal; Notable for the following components:      Result Value   Spec Grav, UA >=1.030 (*)    All other components within normal limits    EKG   Radiology CT ABDOMEN PELVIS WO CONTRAST  Result Date: 06/03/2021 CLINICAL DATA:  Flank pain EXAM: CT ABDOMEN AND PELVIS WITHOUT CONTRAST TECHNIQUE: Multidetector CT imaging of the abdomen and pelvis was performed following  the standard protocol without IV contrast. RADIATION DOSE REDUCTION: This exam was performed according to  the departmental dose-optimization program which includes automated exposure control, adjustment of the mA and/or kV according to patient size and/or use of iterative reconstruction technique. COMPARISON:  06/08/2010 FINDINGS: Lower chest: Unremarkable. Hepatobiliary: There are few subcentimeter low-density foci in the right lobe of liver which are too small to be optimally characterized. There is no dilation of bile ducts. Gallbladder is distended. There is no wall thickening in gallbladder. Pancreas: No focal abnormality is seen. Spleen: Unremarkable. Adrenals/Urinary Tract: Adrenals are unremarkable. There is no hydronephrosis. There are no renal or ureteral stones. There are multiple smooth marginated low-density lesions in both kidneys largest measuring 6.5 cm in size in the upper pole of right kidney. Suggesting renal cysts. There is 11 mm smooth marginated lesion with high density measurements in the upper pole of left kidney. Urinary bladder is unremarkable. Stomach/Bowel: Stomach is not distended. Small bowel loops are not dilated. Appendix is difficult to visualize. There is a small caliber tubular structure adjacent to the tip of cecum seen in the coronal reconstruction images, possibly normal appendix. There is no pericecal inflammation. There is no significant wall thickening in the colon. Few diverticula are seen in colon without signs of focal acute diverticulitis. Vascular/Lymphatic: Scattered arterial calcifications are seen. Reproductive: Prostate appears smaller than usual, possibly suggesting previous intervention. Other: There is no ascites or pneumoperitoneum. Small bilateral inguinal hernias containing fat are noted. Musculoskeletal: Significant degenerative changes are noted in both hips. Degenerative changes are noted in the lumbar spine. IMPRESSION: There is no evidence of intestinal  obstruction or pneumoperitoneum. There is no hydronephrosis. There are few subcentimeter low-density foci in the right lobe of liver, possibly cysts or hemangiomas. The lesions are not optimally characterized in this noncontrast study. Bilateral renal cysts. There is 11 mm lesion in the upper pole of left kidney with density measurements higher than usual for simple cyst, possibly suggesting hemorrhagic cyst. Follow-up renal sonogram may be considered. Few diverticula are seen in the colon without signs of focal diverticulitis. Marked degenerative changes are noted in both hips. Lumbar spondylosis. Other findings as described in the body of the report. Electronically Signed   By: Elmer Picker M.D.   On: 06/03/2021 11:53    Procedures Procedures (including critical care time)  Medications Ordered in UC Medications - No data to display  Initial Impression / Assessment and Plan / UC Course  I have reviewed the triage vital signs and the nursing notes.  Pertinent labs & imaging results that were available during my care of the patient were reviewed by me and considered in my medical decision making (see chart for details).     MDM: 1.  Right flank pain-CT of abdomen and pelvis revealed above, low-tension lesions/renal cysts in both kidneys 6.5 cm of right kidney and 11 mm of left kidney. 2.  Constipation-advised patient to increase daily fiber intake to 80 mg and to increase daily water intake to 64 ounces per day. Advised patient of CT of abdomen and pelvis today with hard copy provided to patient.  Advised patient to follow-up with PCP for further evaluation of bilateral renal cyst.  Encouraged patient to increase daily water intake to 64 ounces per day and to increase daily fiber intake to 80 mg/day.  Discharged home, hemodynamically stable. Final Clinical Impressions(s) / UC Diagnoses   Final diagnoses:  Right flank pain  Chronic constipation     Discharge Instructions      Advised  patient of CT of abdomen and pelvis today with hard copy  provided to patient.  Advised patient to follow-up with PCP for further evaluation of bilateral renal cyst.  Encouraged patient to increase daily water intake to 64 ounces per day and to increase daily fiber intake to 80 mg/day.     ED Prescriptions   None    PDMP not reviewed this encounter.   Eliezer Lofts, [place] 06/03/21 1225

## 2021-06-25 ENCOUNTER — Ambulatory Visit (INDEPENDENT_AMBULATORY_CARE_PROVIDER_SITE_OTHER): Payer: Medicare Other | Admitting: Family Medicine

## 2021-06-25 ENCOUNTER — Telehealth: Payer: Self-pay | Admitting: Family Medicine

## 2021-06-25 ENCOUNTER — Encounter: Payer: Self-pay | Admitting: Family Medicine

## 2021-06-25 VITALS — BP 138/71 | HR 56 | Temp 98.6°F | Ht 71.0 in | Wt 237.0 lb

## 2021-06-25 DIAGNOSIS — E1159 Type 2 diabetes mellitus with other circulatory complications: Secondary | ICD-10-CM | POA: Diagnosis not present

## 2021-06-25 DIAGNOSIS — I10 Essential (primary) hypertension: Secondary | ICD-10-CM | POA: Diagnosis not present

## 2021-06-25 DIAGNOSIS — E1142 Type 2 diabetes mellitus with diabetic polyneuropathy: Secondary | ICD-10-CM

## 2021-06-25 DIAGNOSIS — E039 Hypothyroidism, unspecified: Secondary | ICD-10-CM

## 2021-06-25 DIAGNOSIS — I152 Hypertension secondary to endocrine disorders: Secondary | ICD-10-CM

## 2021-06-25 DIAGNOSIS — G43709 Chronic migraine without aura, not intractable, without status migrainosus: Secondary | ICD-10-CM

## 2021-06-25 LAB — BAYER DCA HB A1C WAIVED: HB A1C (BAYER DCA - WAIVED): 7.1 % — ABNORMAL HIGH (ref 4.8–5.6)

## 2021-06-25 MED ORDER — GLIMEPIRIDE 2 MG PO TABS: 2.0000 mg | ORAL_TABLET | Freq: Every day | ORAL | 1 refills | Status: DC

## 2021-06-25 MED ORDER — SUMATRIPTAN SUCCINATE 100 MG PO TABS: ORAL_TABLET | ORAL | 1 refills | Status: DC

## 2021-06-25 NOTE — Telephone Encounter (Signed)
Pt aware Glimepiride sent to [facility] but not the Sumatriptan

## 2021-06-26 LAB — CMP14+EGFR
ALT: 22 IU/L (ref 0–44)
AST: 19 IU/L (ref 0–40)
Albumin/Globulin Ratio: 1.8 (ref 1.2–2.2)
Albumin: 4.6 g/dL (ref 3.8–4.8)
Alkaline Phosphatase: 104 IU/L (ref 44–121)
BUN/Creatinine Ratio: 13 (ref 10–24)
BUN: 13 mg/dL (ref 8–27)
Bilirubin Total: 0.4 mg/dL (ref 0.0–1.2)
CO2: 23 mmol/L (ref 20–29)
Calcium: 9.4 mg/dL (ref 8.6–10.2)
Chloride: 100 mmol/L (ref 96–106)
Creatinine, Ser: 1 mg/dL (ref 0.76–1.27)
Globulin, Total: 2.5 g/dL (ref 1.5–4.5)
Glucose: 125 mg/dL — ABNORMAL HIGH (ref 70–99)
Potassium: 4.5 mmol/L (ref 3.5–5.2)
Sodium: 142 mmol/L (ref 134–144)
Total Protein: 7.1 g/dL (ref 6.0–8.5)
eGFR: 81 mL/min/{1.73_m2} (ref >59)

## 2021-06-26 LAB — CBC WITH DIFFERENTIAL/PLATELET
Basophils Absolute: 0.1 10*3/uL (ref 0.0–0.2)
Basos: 1 %
EOS (ABSOLUTE): 0.4 10*3/uL (ref 0.0–0.4)
Eos: 5 %
Hematocrit: 43.8 % (ref 37.5–51.0)
Hemoglobin: 14.1 g/dL (ref 13.0–17.7)
Immature Grans (Abs): 0 10*3/uL (ref 0.0–0.1)
Immature Granulocytes: 0 %
Lymphocytes Absolute: 2.1 10*3/uL (ref 0.7–3.1)
Lymphs: 27 %
MCH: 27 pg (ref 26.6–33.0)
MCHC: 32.2 g/dL (ref 31.5–35.7)
MCV: 84 fL (ref 79–97)
Monocytes Absolute: 0.6 10*3/uL (ref 0.1–0.9)
Monocytes: 7 %
Neutrophils Absolute: 4.6 10*3/uL (ref 1.4–7.0)
Neutrophils: 60 %
Platelets: 292 10*3/uL (ref 150–450)
RBC: 5.22 x10E6/uL (ref 4.14–5.80)
RDW: 13.5 % (ref 11.6–15.4)
WBC: 7.7 10*3/uL (ref 3.4–10.8)

## 2021-06-26 LAB — LIPID PANEL
Chol/HDL Ratio: 3.8 ratio (ref 0.0–5.0)
Cholesterol, Total: 133 mg/dL (ref 100–199)
HDL: 35 mg/dL — ABNORMAL LOW (ref >39)
LDL Chol Calc (NIH): 83 mg/dL (ref 0–99)
Triglycerides: 77 mg/dL (ref 0–149)
VLDL Cholesterol Cal: 15 mg/dL (ref 5–40)

## 2021-06-26 LAB — TSH: TSH: 4.33 u[IU]/mL (ref 0.450–4.500)

## 2021-07-14 DIAGNOSIS — M545 Low back pain, unspecified: Secondary | ICD-10-CM | POA: Diagnosis not present

## 2021-07-14 DIAGNOSIS — M25552 Pain in left hip: Secondary | ICD-10-CM | POA: Diagnosis not present

## 2021-08-23 DIAGNOSIS — Z79899 Other long term (current) drug therapy: Secondary | ICD-10-CM | POA: Diagnosis not present

## 2021-08-23 DIAGNOSIS — M545 Low back pain, unspecified: Secondary | ICD-10-CM | POA: Diagnosis not present

## 2021-08-23 DIAGNOSIS — M25552 Pain in left hip: Secondary | ICD-10-CM | POA: Diagnosis not present

## 2021-09-27 ENCOUNTER — Ambulatory Visit (INDEPENDENT_AMBULATORY_CARE_PROVIDER_SITE_OTHER): Payer: Medicare Other | Admitting: Family Medicine

## 2021-09-27 ENCOUNTER — Encounter: Payer: Self-pay | Admitting: Family Medicine

## 2021-09-27 VITALS — BP 141/67 | HR 57 | Temp 98.2°F | Ht 71.0 in | Wt 238.0 lb

## 2021-09-27 DIAGNOSIS — I152 Hypertension secondary to endocrine disorders: Secondary | ICD-10-CM | POA: Diagnosis not present

## 2021-09-27 DIAGNOSIS — G43909 Migraine, unspecified, not intractable, without status migrainosus: Secondary | ICD-10-CM

## 2021-09-27 DIAGNOSIS — E1159 Type 2 diabetes mellitus with other circulatory complications: Secondary | ICD-10-CM

## 2021-09-27 DIAGNOSIS — E1142 Type 2 diabetes mellitus with diabetic polyneuropathy: Secondary | ICD-10-CM

## 2021-09-27 DIAGNOSIS — E039 Hypothyroidism, unspecified: Secondary | ICD-10-CM | POA: Diagnosis not present

## 2021-09-27 LAB — BAYER DCA HB A1C WAIVED: HB A1C (BAYER DCA - WAIVED): 6.7 % — ABNORMAL HIGH (ref 4.8–5.6)

## 2021-09-27 NOTE — Progress Notes (Signed)
BP (!) 141/67   Pulse (!) 57   Temp 98.2 F (36.8 C)   Ht 5' 11"  (1.803 m)   Wt 238 lb (108 kg)   SpO2 97%   BMI 33.19 kg/m    Subjective:   Patient ID: Stephen Lawson, male    DOB: 1950-02-23, 71 y.o.   MRN: [id number]  HPI: Stephen Lawson is a 71 y.o. male presenting on 09/27/2021 for Medical Management of Chronic Issues, Hypertension, and Diabetes   HPI Type 2 diabetes mellitus Patient comes in today for recheck of his diabetes. Patient has been currently taking metformin and glimepiride. Patient is currently on an ACE inhibitor/ARB. Patient has seen an ophthalmologist this year. Patient denies any issues with their feet. The symptom started onset as an adult hypertension and hypothyroidism ARE RELATED TO DM   Hypertension Patient is currently on amlodipine and atenolol, and their blood pressure today is 141/67. Patient denies any lightheadedness or dizziness. Patient denies headaches, blurred vision, chest pains, shortness of breath, or weakness. Denies any side effects from medication and is content with current medication.   Hypothyroidism recheck Patient is coming in for thyroid recheck today as well. They deny any issues with hair changes or heat or cold problems or diarrhea or constipation. They deny any chest pain or palpitations. They are currently on levothyroxine 112 micrograms   Migraine recheck Patient is coming in today for migraine recheck.  He currently uses atenolol for prevention and sumatriptan as his regular medicine.  He says he has not used the sumatriptan since he retired and has not had migraines or headaches and feels like he is doing very well.  Relevant past medical, surgical, family and social history reviewed and updated as indicated. Interim medical history since our last visit reviewed. Allergies and medications reviewed and updated.  Review of Systems  Constitutional:  Negative for chills and fever.  Eyes:  Negative for visual disturbance.   Respiratory:  Negative for shortness of breath and wheezing.   Cardiovascular:  Negative for chest pain and leg swelling.  Musculoskeletal:  Negative for back pain and gait problem.  Skin:  Negative for rash.  Neurological:  Negative for dizziness, weakness and light-headedness.  All other systems reviewed and are negative.   Per HPI unless specifically indicated above   Allergies as of 09/27/2021       Reactions   Darvocet [propoxyphene N-acetaminophen]    Itching   Talwin [pentazocine]    itching   Ultram [tramadol]         Medication List        Accurate as of September 27, 2021 11:22 AM. If you have any questions, ask your nurse or doctor.          STOP taking these medications    SUMAtriptan 100 MG tablet Commonly known as: IMITREX Stopped by: Fransisca Kaufmann Sharis Keeran, MD       TAKE these medications    amLODipine 10 MG tablet Commonly known as: NORVASC Take 1 tablet (10 mg total) by mouth daily.   atenolol 100 MG tablet Commonly known as: TENORMIN Take 1 tablet (100 mg total) by mouth daily.   blood glucose meter kit and supplies Kit Dispense based on patient and insurance preference. Use up to four times daily as directed. (FOR ICD-9 250.00, 250.01).   clobetasol cream 0.05 % Commonly known as: TEMOVATE Apply 1 application topically 2 (two) times daily.   glimepiride 2 MG tablet Commonly known as: AMARYL Take  1 tablet (2 mg total) by mouth daily before breakfast.   levothyroxine 112 MCG tablet Commonly known as: SYNTHROID Take 1 tablet (112 mcg total) by mouth daily.   metFORMIN 500 MG tablet Commonly known as: GLUCOPHAGE Take 2 tablets (1,000 mg total) by mouth 2 (two) times daily with a meal.   methadone 10 MG tablet Commonly known as: DOLOPHINE Take 10 mg by mouth 2 (two) times daily.         Objective:   BP (!) 141/67   Pulse (!) 57   Temp 98.2 F (36.8 C)   Ht 5' 11"  (1.803 m)   Wt 238 lb (108 kg)   SpO2 97%   BMI 33.19  kg/m   Wt Readings from Last 3 Encounters:  09/27/21 238 lb (108 kg)  06/25/21 237 lb (107.5 kg)  03/18/21 241 lb (109.3 kg)    Physical Exam Vitals and nursing note reviewed.  Constitutional:      General: He is not in acute distress.    Appearance: He is well-developed. He is not diaphoretic.  Eyes:     General: No scleral icterus.    Conjunctiva/sclera: Conjunctivae normal.  Neck:     Thyroid: No thyromegaly.  Cardiovascular:     Rate and Rhythm: Normal rate and regular rhythm.     Heart sounds: Normal heart sounds. No murmur heard. Pulmonary:     Effort: Pulmonary effort is normal. No respiratory distress.     Breath sounds: Normal breath sounds. No wheezing.  Musculoskeletal:        General: No swelling. Normal range of motion.     Cervical back: Neck supple.  Lymphadenopathy:     Cervical: No cervical adenopathy.  Skin:    General: Skin is warm and dry.     Findings: No rash.  Neurological:     Mental Status: He is alert and oriented to person, place, and time.     Coordination: Coordination normal.  Psychiatric:        Behavior: Behavior normal.       Assessment & Plan:   Problem List Items Addressed This Visit       Cardiovascular and Mediastinum   Hypertension associated with diabetes ([place])   Migraine headache     Endocrine   T2DM (type 2 diabetes mellitus) ([place]) - Primary   Relevant Orders   TSH   Bayer DCA Hb A1c Waived   Hypothyroid   Relevant Orders   TSH   Bayer DCA Hb A1c Waived    A1c is 6.7, looks a lot better than before, continue with current medicine, no changes. Follow up plan: Return in about 3 months (around 12/27/2021), or if symptoms worsen or fail to improve, for Diabetes hypertension and thyroid recheck.  Counseling provided for all of the vaccine components Orders Placed This Encounter  Procedures   TSH   Bayer [place] Hb A1c Carrick Adan Beal, MD [facility] 09/27/2021, 11:22 AM

## 2021-09-28 LAB — TSH: TSH: 2.24 u[IU]/mL (ref 0.450–4.500)

## 2021-10-18 DIAGNOSIS — Z79899 Other long term (current) drug therapy: Secondary | ICD-10-CM | POA: Diagnosis not present

## 2021-10-18 DIAGNOSIS — M25552 Pain in left hip: Secondary | ICD-10-CM | POA: Diagnosis not present

## 2021-10-18 DIAGNOSIS — M545 Low back pain, unspecified: Secondary | ICD-10-CM | POA: Diagnosis not present

## 2021-11-04 ENCOUNTER — Other Ambulatory Visit: Payer: Self-pay | Admitting: Family Medicine

## 2021-11-04 DIAGNOSIS — I10 Essential (primary) hypertension: Secondary | ICD-10-CM

## 2021-11-22 ENCOUNTER — Other Ambulatory Visit: Payer: Self-pay | Admitting: Family Medicine

## 2021-11-22 DIAGNOSIS — E1142 Type 2 diabetes mellitus with diabetic polyneuropathy: Secondary | ICD-10-CM

## 2021-11-22 DIAGNOSIS — I152 Hypertension secondary to endocrine disorders: Secondary | ICD-10-CM

## 2021-11-23 DIAGNOSIS — H6123 Impacted cerumen, bilateral: Secondary | ICD-10-CM | POA: Diagnosis not present

## 2021-12-13 DIAGNOSIS — Z79899 Other long term (current) drug therapy: Secondary | ICD-10-CM | POA: Diagnosis not present

## 2021-12-13 DIAGNOSIS — M545 Low back pain, unspecified: Secondary | ICD-10-CM | POA: Diagnosis not present

## 2021-12-13 DIAGNOSIS — M25552 Pain in left hip: Secondary | ICD-10-CM | POA: Diagnosis not present

## 2022-01-06 ENCOUNTER — Encounter: Payer: Self-pay | Admitting: Family Medicine

## 2022-01-06 ENCOUNTER — Ambulatory Visit (INDEPENDENT_AMBULATORY_CARE_PROVIDER_SITE_OTHER): Payer: Medicare Other | Admitting: Family Medicine

## 2022-01-06 VITALS — BP 157/71 | HR 73 | Temp 97.6°F | Ht 71.0 in | Wt 235.0 lb

## 2022-01-06 DIAGNOSIS — E1159 Type 2 diabetes mellitus with other circulatory complications: Secondary | ICD-10-CM

## 2022-01-06 DIAGNOSIS — E1142 Type 2 diabetes mellitus with diabetic polyneuropathy: Secondary | ICD-10-CM | POA: Diagnosis not present

## 2022-01-06 DIAGNOSIS — E039 Hypothyroidism, unspecified: Secondary | ICD-10-CM | POA: Diagnosis not present

## 2022-01-06 DIAGNOSIS — I152 Hypertension secondary to endocrine disorders: Secondary | ICD-10-CM

## 2022-01-06 DIAGNOSIS — I10 Essential (primary) hypertension: Secondary | ICD-10-CM

## 2022-01-06 LAB — BAYER DCA HB A1C WAIVED: HB A1C (BAYER DCA - WAIVED): 6.9 % — ABNORMAL HIGH (ref 4.8–5.6)

## 2022-01-06 MED ORDER — AMLODIPINE BESYLATE 10 MG PO TABS: 10.0000 mg | ORAL_TABLET | Freq: Every day | ORAL | 3 refills | Status: DC

## 2022-01-06 MED ORDER — METFORMIN HCL 500 MG PO TABS: 1000.0000 mg | ORAL_TABLET | Freq: Two times a day (BID) | ORAL | 3 refills | Status: DC

## 2022-01-06 MED ORDER — ATENOLOL 100 MG PO TABS: 100.0000 mg | ORAL_TABLET | Freq: Every day | ORAL | 3 refills | Status: DC

## 2022-01-06 MED ORDER — LEVOTHYROXINE SODIUM 112 MCG PO TABS
112.0000 ug | ORAL_TABLET | Freq: Every day | ORAL | 3 refills | Status: DC
Start: 2022-01-06 — End: 2023-01-16

## 2022-01-06 MED ORDER — GLIMEPIRIDE 2 MG PO TABS
2.0000 mg | ORAL_TABLET | Freq: Every day | ORAL | 3 refills | Status: DC
Start: 2022-01-06 — End: 2022-04-08

## 2022-01-06 NOTE — Progress Notes (Signed)
BP (!) 157/71   Pulse 73   Temp 97.6 F (36.4 C)   Ht _0  (1.803 m)   Wt 235 lb (106.6 kg)   SpO2 99%   BMI 32.78 kg/m    Subjective:   Patient ID: Stephen Lawson, male    DOB: November 27, 1950, 72 y.o.   MRN: [id number]  HPI: Stephen Lawson is a 72 y.o. male presenting on 01/06/2022 for Medical Management of Chronic Issues, Diabetes, and Hypertension   HPI Type 2 diabetes mellitus Patient comes in today for recheck of his diabetes. Patient has been currently taking glimepiride and metformin. Patient is not currently on an ACE inhibitor/ARB. Patient has not seen an ophthalmologist this year. Patient denies any issues with their feet. The symptom started onset as an adult hypertension and hypothyroidism and fatty liver ARE RELATED TO DM   Hypertension Patient is currently on amlodipine and atenolol, and their blood pressure today is 157/71. Patient denies any lightheadedness or dizziness. Patient denies headaches, blurred vision, chest pains, shortness of breath, or weakness. Denies any side effects from medication and is content with current medication.   Hypothyroidism recheck Patient is coming in for thyroid recheck today as well. They deny any issues with hair changes or heat or cold problems or diarrhea or constipation. They deny any chest pain or palpitations. They are currently on levothyroxine 112 micrograms  Relevant past medical, surgical, family and social history reviewed and updated as indicated. Interim medical history since our last visit reviewed. Allergies and medications reviewed and updated.  Review of Systems  Constitutional:  Negative for chills and fever.  Eyes:  Negative for visual disturbance.  Respiratory:  Negative for shortness of breath and wheezing.   Cardiovascular:  Negative for chest pain and leg swelling.  Musculoskeletal:  Negative for back pain and gait problem.  Skin:  Negative for rash.  Neurological:  Negative for dizziness, weakness and  light-headedness.  All other systems reviewed and are negative.   Per HPI unless specifically indicated above   Allergies as of 01/06/2022       Reactions   Darvocet [propoxyphene N-acetaminophen]    Itching   Talwin [pentazocine]    itching   Ultram [tramadol]         Medication List        Accurate as of January 06, 2022 11:07 AM. If you have any questions, ask your nurse or doctor.          amLODipine 10 MG tablet Commonly known as: NORVASC Take 1 tablet (10 mg total) by mouth daily.   atenolol 100 MG tablet Commonly known as: TENORMIN Take 1 tablet (100 mg total) by mouth daily.   blood glucose meter kit and supplies Kit Dispense based on patient and insurance preference. Use up to four times daily as directed. (FOR ICD-9 250.00, 250.01).   clobetasol cream 0.05 % Commonly known as: TEMOVATE Apply 1 application topically 2 (two) times daily.   glimepiride 2 MG tablet Commonly known as: AMARYL Take 1 tablet (2 mg total) by mouth daily before breakfast.   levothyroxine 112 MCG tablet Commonly known as: SYNTHROID Take 1 tablet (112 mcg total) by mouth daily.   metFORMIN 500 MG tablet Commonly known as: GLUCOPHAGE Take 2 tablets (1,000 mg total) by mouth 2 (two) times daily with a meal. What changed: See the new instructions. Changed by: Fransisca Kaufmann Aja Bolander, MD   methadone 10 MG tablet Commonly known as: DOLOPHINE Take 10 mg by mouth  2 (two) times daily.         Objective:   BP (!) 157/71   Pulse 73   Temp 97.6 F (36.4 C)   Ht _0  (1.803 m)   Wt 235 lb (106.6 kg)   SpO2 99%   BMI 32.78 kg/m   Wt Readings from Last 3 Encounters:  01/06/22 235 lb (106.6 kg)  09/27/21 238 lb (108 kg)  06/25/21 237 lb (107.5 kg)    Physical Exam Vitals and nursing note reviewed.  Constitutional:      General: He is not in acute distress.    Appearance: He is well-developed. He is not diaphoretic.  Eyes:     General: No scleral icterus.     Conjunctiva/sclera: Conjunctivae normal.  Neck:     Thyroid: No thyromegaly.  Cardiovascular:     Rate and Rhythm: Normal rate and regular rhythm.     Heart sounds: Normal heart sounds. No murmur heard. Pulmonary:     Effort: Pulmonary effort is normal. No respiratory distress.     Breath sounds: Normal breath sounds. No wheezing.  Musculoskeletal:        General: Normal range of motion.     Cervical back: Neck supple.  Lymphadenopathy:     Cervical: No cervical adenopathy.  Skin:    General: Skin is warm and dry.     Findings: No rash.  Neurological:     Mental Status: He is alert and oriented to person, place, and time.     Coordination: Coordination normal.  Psychiatric:        Behavior: Behavior normal.       Assessment & Plan:   Problem List Items Addressed This Visit       Cardiovascular and Mediastinum   Hypertension associated with diabetes ([place])   Relevant Medications   amLODipine (NORVASC) 10 MG tablet   atenolol (TENORMIN) 100 MG tablet   glimepiride (AMARYL) 2 MG tablet   metFORMIN (GLUCOPHAGE) 500 MG tablet   Other Relevant Orders   CBC with Differential/Platelet   CMP14+EGFR   Lipid panel   TSH   Bayer DCA Hb A1c Waived     Endocrine   T2DM (type 2 diabetes mellitus) (HCC) - Primary   Relevant Medications   glimepiride (AMARYL) 2 MG tablet   metFORMIN (GLUCOPHAGE) 500 MG tablet   Other Relevant Orders   CBC with Differential/Platelet   CMP14+EGFR   Lipid panel   TSH   Bayer DCA Hb A1c Waived   Hypothyroid   Relevant Medications   atenolol (TENORMIN) 100 MG tablet   levothyroxine (SYNTHROID) 112 MCG tablet   Other Relevant Orders   CBC with Differential/Platelet   CMP14+EGFR   Lipid panel   TSH   Bayer DCA Hb A1c Waived   Other Visit Diagnoses     Essential hypertension       Relevant Medications   amLODipine (NORVASC) 10 MG tablet   atenolol (TENORMIN) 100 MG tablet     A1c is 6.9 which is about the same as it has been,  continue current medicine, continue to focus on diet.  Blood pressure up, was much better the last 2 visits so we will just monitor closely.  Not going to adjust medicines today.  Follow up plan: Return in about 3 months (around 04/07/2022), or if symptoms worsen or fail to improve, for Diabetes hypertension and thyroid.  Counseling provided for all of the vaccine components Orders Placed This Encounter  Procedures   CBC  with Differential/Platelet   CMP14+EGFR   Lipid panel   TSH   Bayer DCA Hb A1c Waived    Caryl Pina, MD [facility] 01/06/2022, 11:07 AM

## 2022-01-07 LAB — CBC WITH DIFFERENTIAL/PLATELET
Basophils Absolute: 0.1 10*3/uL (ref 0.0–0.2)
Basos: 1 %
EOS (ABSOLUTE): 0.1 10*3/uL (ref 0.0–0.4)
Eos: 1 %
Hematocrit: 42.7 % (ref 37.5–51.0)
Hemoglobin: 13.9 g/dL (ref 13.0–17.7)
Immature Grans (Abs): 0 10*3/uL (ref 0.0–0.1)
Immature Granulocytes: 0 %
Lymphocytes Absolute: 1.7 10*3/uL (ref 0.7–3.1)
Lymphs: 25 %
MCH: 26.4 pg — ABNORMAL LOW (ref 26.6–33.0)
MCHC: 32.6 g/dL (ref 31.5–35.7)
MCV: 81 fL (ref 79–97)
Monocytes Absolute: 0.5 10*3/uL (ref 0.1–0.9)
Monocytes: 7 %
Neutrophils Absolute: 4.5 10*3/uL (ref 1.4–7.0)
Neutrophils: 66 %
Platelets: 268 10*3/uL (ref 150–450)
RBC: 5.27 x10E6/uL (ref 4.14–5.80)
RDW: 12.8 % (ref 11.6–15.4)
WBC: 6.9 10*3/uL (ref 3.4–10.8)

## 2022-01-07 LAB — CMP14+EGFR
ALT: 18 IU/L (ref 0–44)
AST: 15 IU/L (ref 0–40)
Albumin/Globulin Ratio: 1.6 (ref 1.2–2.2)
Albumin: 4.4 g/dL (ref 3.9–4.9)
Alkaline Phosphatase: 90 IU/L (ref 44–121)
BUN/Creatinine Ratio: 13 (ref 10–24)
BUN: 12 mg/dL (ref 8–27)
Bilirubin Total: 0.4 mg/dL (ref 0.0–1.2)
CO2: 26 mmol/L (ref 20–29)
Calcium: 9.6 mg/dL (ref 8.6–10.2)
Chloride: 101 mmol/L (ref 96–106)
Creatinine, Ser: 0.89 mg/dL (ref 0.76–1.27)
Globulin, Total: 2.7 g/dL (ref 1.5–4.5)
Glucose: 143 mg/dL — ABNORMAL HIGH (ref 70–99)
Potassium: 4.4 mmol/L (ref 3.5–5.2)
Sodium: 140 mmol/L (ref 134–144)
Total Protein: 7.1 g/dL (ref 6.0–8.5)
eGFR: 92 mL/min/{1.73_m2} (ref >59)

## 2022-01-07 LAB — LIPID PANEL
Chol/HDL Ratio: 2.9 ratio (ref 0.0–5.0)
Cholesterol, Total: 130 mg/dL (ref 100–199)
HDL: 45 mg/dL (ref >39)
LDL Chol Calc (NIH): 74 mg/dL (ref 0–99)
Triglycerides: 47 mg/dL (ref 0–149)
VLDL Cholesterol Cal: 11 mg/dL (ref 5–40)

## 2022-01-07 LAB — TSH: TSH: 2.4 u[IU]/mL (ref 0.450–4.500)

## 2022-02-21 DIAGNOSIS — M25552 Pain in left hip: Secondary | ICD-10-CM | POA: Diagnosis not present

## 2022-02-21 DIAGNOSIS — M545 Low back pain, unspecified: Secondary | ICD-10-CM | POA: Diagnosis not present

## 2022-02-22 ENCOUNTER — Telehealth: Payer: Self-pay | Admitting: Family Medicine

## 2022-02-22 NOTE — Telephone Encounter (Signed)
Fordyce to schedule their annual wellness visit. Patient declined to schedule AWV at this time.  Do not call back - feels it is not necessary   Thank you,  [place] ??[id number]

## 2022-04-08 ENCOUNTER — Ambulatory Visit (INDEPENDENT_AMBULATORY_CARE_PROVIDER_SITE_OTHER): Payer: Medicare Other | Admitting: Family Medicine

## 2022-04-08 ENCOUNTER — Encounter: Payer: Self-pay | Admitting: Family Medicine

## 2022-04-08 VITALS — BP 155/70 | HR 61 | Ht 71.0 in | Wt 237.0 lb

## 2022-04-08 DIAGNOSIS — E1142 Type 2 diabetes mellitus with diabetic polyneuropathy: Secondary | ICD-10-CM

## 2022-04-08 DIAGNOSIS — E039 Hypothyroidism, unspecified: Secondary | ICD-10-CM | POA: Diagnosis not present

## 2022-04-08 DIAGNOSIS — Z7984 Long term (current) use of oral hypoglycemic drugs: Secondary | ICD-10-CM

## 2022-04-08 DIAGNOSIS — I152 Hypertension secondary to endocrine disorders: Secondary | ICD-10-CM

## 2022-04-08 DIAGNOSIS — E1159 Type 2 diabetes mellitus with other circulatory complications: Secondary | ICD-10-CM | POA: Diagnosis not present

## 2022-04-08 LAB — BAYER DCA HB A1C WAIVED: HB A1C (BAYER DCA - WAIVED): 8.1 % — ABNORMAL HIGH (ref 4.8–5.6)

## 2022-04-08 MED ORDER — LISINOPRIL 20 MG PO TABS: 20.0000 mg | ORAL_TABLET | Freq: Every day | ORAL | 3 refills | Status: DC

## 2022-04-08 MED ORDER — GLIMEPIRIDE 4 MG PO TABS: 4.0000 mg | ORAL_TABLET | Freq: Every day | ORAL | 3 refills | Status: DC

## 2022-04-08 NOTE — Progress Notes (Signed)
BP (!) 155/70   Pulse 61   Ht 5\' 11"  (1.803 m)   Wt 237 lb (107.5 kg)   SpO2 95%   BMI 33.05 kg/m    Subjective:   Patient ID: Stephen Lawson, male    DOB: March 04, 1950, 72 y.o.   MRN: [id number]  HPI: Stephen Lawson is a 72 y.o. male presenting on 04/08/2022 for Medical Management of Chronic Issues, Diabetes, and Hypertension   HPI Type 2 diabetes mellitus Patient comes in today for recheck of his diabetes. Patient has been currently taking metformin and glimepiride. Patient is not currently on an ACE inhibitor/ARB. Patient has not seen an ophthalmologist this year. Patient denies any new issues with their feet. The symptom started onset as an adult hypertension and hypothyroidism and fatty liver ARE RELATED TO DM   Hypertension Patient is currently on amlodipine and atenolol, and their blood pressure today is 155/70. Patient denies any lightheadedness or dizziness. Patient denies headaches, blurred vision, chest pains, shortness of breath, or weakness. Denies any side effects from medication and is content with current medication.   Hypothyroidism recheck Patient is coming in for thyroid recheck today as well. They deny any issues with hair changes or heat or cold problems or diarrhea or constipation. They deny any chest pain or palpitations. They are currently on levothyroxine 112 micrograms   Relevant past medical, surgical, family and social history reviewed and updated as indicated. Interim medical history since our last visit reviewed. Allergies and medications reviewed and updated.  Review of Systems  Constitutional:  Negative for chills and fever.  Eyes:  Negative for visual disturbance.  Respiratory:  Negative for shortness of breath and wheezing.   Cardiovascular:  Negative for chest pain and leg swelling.  Musculoskeletal:  Negative for back pain and gait problem.  Skin:  Negative for rash.  Neurological:  Negative for dizziness, weakness and light-headedness.  All other  systems reviewed and are negative.   Per HPI unless specifically indicated above   Allergies as of 04/08/2022       Reactions   Darvocet [propoxyphene N-acetaminophen]    Itching   Talwin [pentazocine]    itching   Ultram [tramadol]         Medication List        Accurate as of April 08, 2022 10:46 AM. If you have any questions, ask your nurse or doctor.          amLODipine 10 MG tablet Commonly known as: NORVASC Take 1 tablet (10 mg total) by mouth daily.   atenolol 100 MG tablet Commonly known as: TENORMIN Take 1 tablet (100 mg total) by mouth daily.   blood glucose meter kit and supplies Kit Dispense based on patient and insurance preference. Use up to four times daily as directed. (FOR ICD-9 250.00, 250.01).   clobetasol cream 0.05 % Commonly known as: TEMOVATE Apply 1 application topically 2 (two) times daily.   glimepiride 4 MG tablet Commonly known as: AMARYL Take 1 tablet (4 mg total) by mouth daily before breakfast. What changed:  medication strength how much to take Changed by: Nils Pyle, MD   levothyroxine 112 MCG tablet Commonly known as: SYNTHROID Take 1 tablet (112 mcg total) by mouth daily.   lisinopril 20 MG tablet Commonly known as: ZESTRIL Take 1 tablet (20 mg total) by mouth daily. Started by: Nils Pyle, MD   metFORMIN 500 MG tablet Commonly known as: GLUCOPHAGE Take 2 tablets (1,000 mg total) by  mouth 2 (two) times daily with a meal.   methadone 10 MG tablet Commonly known as: DOLOPHINE Take 10 mg by mouth 2 (two) times daily.         Objective:   BP (!) 155/70   Pulse 61   Ht 5\' 11"  (1.803 m)   Wt 237 lb (107.5 kg)   SpO2 95%   BMI 33.05 kg/m   Wt Readings from Last 3 Encounters:  04/08/22 237 lb (107.5 kg)  01/06/22 235 lb (106.6 kg)  09/27/21 238 lb (108 kg)    Physical Exam Vitals and nursing note reviewed.  Constitutional:      General: He is not in acute distress.    Appearance: He is  well-developed. He is not diaphoretic.  Eyes:     General: No scleral icterus.    Conjunctiva/sclera: Conjunctivae normal.  Neck:     Thyroid: No thyromegaly.  Cardiovascular:     Rate and Rhythm: Normal rate and regular rhythm.     Heart sounds: Normal heart sounds. No murmur heard. Pulmonary:     Effort: Pulmonary effort is normal. No respiratory distress.     Breath sounds: Normal breath sounds. No wheezing.  Musculoskeletal:        General: No swelling. Normal range of motion.     Cervical back: Neck supple.  Lymphadenopathy:     Cervical: No cervical adenopathy.  Skin:    General: Skin is warm and dry.     Findings: No rash.  Neurological:     Mental Status: He is alert and oriented to person, place, and time.     Coordination: Coordination normal.  Psychiatric:        Behavior: Behavior normal.       Assessment & Plan:   Problem List Items Addressed This Visit       Cardiovascular and Mediastinum   Hypertension associated with diabetes   Relevant Medications   glimepiride (AMARYL) 4 MG tablet   lisinopril (ZESTRIL) 20 MG tablet     Endocrine   T2DM (type 2 diabetes mellitus) - Primary   Relevant Medications   glimepiride (AMARYL) 4 MG tablet   lisinopril (ZESTRIL) 20 MG tablet   Other Relevant Orders   Bayer DCA Hb A1c Waived   BMP8+EGFR   Hypothyroid    A1c is up at 8.1, blood pressure is up as well and it was up last time.  Will start him on lisinopril 20, will increase glimepiride to 4 mg daily  Patient has been instructed to come back and check a basic metabolic panel in 2 to 3 weeks after starting the lisinopril. Follow up plan: Return in about 3 months (around 07/08/2022), or if symptoms worsen or fail to improve, for Diabetes recheck.  Counseling provided for all of the vaccine components Orders Placed This Encounter  Procedures   [facility] Waived   BMP8+EGFR    Arville CareJoshua Abdul Beirne, MD [facility] 04/08/2022, 10:46  AM

## 2022-04-11 DIAGNOSIS — Z79891 Long term (current) use of opiate analgesic: Secondary | ICD-10-CM | POA: Diagnosis not present

## 2022-04-20 DIAGNOSIS — M25552 Pain in left hip: Secondary | ICD-10-CM | POA: Diagnosis not present

## 2022-04-20 DIAGNOSIS — M545 Low back pain, unspecified: Secondary | ICD-10-CM | POA: Diagnosis not present

## 2022-05-31 DIAGNOSIS — M67911 Unspecified disorder of synovium and tendon, right shoulder: Secondary | ICD-10-CM | POA: Diagnosis not present

## 2022-05-31 DIAGNOSIS — M160 Bilateral primary osteoarthritis of hip: Secondary | ICD-10-CM | POA: Diagnosis not present

## 2022-05-31 DIAGNOSIS — M545 Low back pain, unspecified: Secondary | ICD-10-CM | POA: Diagnosis not present

## 2022-05-31 DIAGNOSIS — M654 Radial styloid tenosynovitis [de Quervain]: Secondary | ICD-10-CM | POA: Diagnosis not present

## 2022-06-13 DIAGNOSIS — Z79891 Long term (current) use of opiate analgesic: Secondary | ICD-10-CM | POA: Diagnosis not present

## 2022-06-15 DIAGNOSIS — M545 Low back pain, unspecified: Secondary | ICD-10-CM | POA: Diagnosis not present

## 2022-06-21 ENCOUNTER — Encounter: Payer: Self-pay | Admitting: Nurse Practitioner

## 2022-06-21 ENCOUNTER — Ambulatory Visit (INDEPENDENT_AMBULATORY_CARE_PROVIDER_SITE_OTHER): Payer: Medicare Other

## 2022-06-21 ENCOUNTER — Ambulatory Visit (INDEPENDENT_AMBULATORY_CARE_PROVIDER_SITE_OTHER): Payer: Medicare Other | Admitting: Nurse Practitioner

## 2022-06-21 VITALS — BP 160/74 | HR 66 | Temp 97.7°F | Resp 20 | Ht 71.0 in | Wt 231.0 lb

## 2022-06-21 DIAGNOSIS — R911 Solitary pulmonary nodule: Secondary | ICD-10-CM | POA: Diagnosis not present

## 2022-06-21 DIAGNOSIS — Z01818 Encounter for other preprocedural examination: Secondary | ICD-10-CM

## 2022-06-21 NOTE — Progress Notes (Signed)
Subjective:    Patient ID: Stephen Lawson, male    DOB: [birth year], 72 y.o.   MRN: [id number]   Chief Complaint: Pre-op Exam   HPI Patient is waiting for surgical clearance to be scheduled for right hip replacement. He is doing well. Was last seen by his PCP on 04/08/22 and was doing well. He has no complaints today.  Patient Active Problem List   Diagnosis Date Noted   Hearing loss of both ears due to cerumen impaction 05/11/2017   Migraine headache 11/22/2016   Hypothyroid 11/22/2016   T2DM (type 2 diabetes mellitus) (HCC) 10/02/2015   Hypertension associated with diabetes (HCC) 10/02/2015   Chronic back pain 06/29/2015   Allergic rhinitis 02/25/2013   Dysphagia, unspecified(787.20) 01/22/2013   Fatty liver 12/06/2012   H/O asbestos exposure 12/06/2012   Edema 12/06/2012       Review of Systems  Constitutional:  Negative for diaphoresis.  Eyes:  Negative for pain.  Respiratory:  Negative for shortness of breath.   Cardiovascular:  Negative for chest pain, palpitations and leg swelling.  Gastrointestinal:  Negative for abdominal pain.  Endocrine: Negative for polydipsia.  Skin:  Negative for rash.  Neurological:  Negative for dizziness, weakness and headaches.  Hematological:  Does not bruise/bleed easily.  All other systems reviewed and are negative.      Objective:   Physical Exam Vitals and nursing note reviewed.  Constitutional:      Appearance: Normal appearance. He is well-developed.  HENT:     Head: Normocephalic.     Nose: Nose normal.     Mouth/Throat:     Mouth: Mucous membranes are moist.     Pharynx: Oropharynx is clear.  Eyes:     Pupils: Pupils are equal, round, and reactive to light.  Neck:     Thyroid: No thyroid mass or thyromegaly.     Vascular: No carotid bruit or JVD.     Trachea: Phonation normal.  Cardiovascular:     Rate and Rhythm: Normal rate and regular rhythm.  Pulmonary:     Effort: Pulmonary effort is normal. No respiratory  distress.     Breath sounds: Normal breath sounds.  Abdominal:     General: Bowel sounds are normal.     Palpations: Abdomen is soft.     Tenderness: There is no abdominal tenderness.  Musculoskeletal:        General: Normal range of motion.     Cervical back: Normal range of motion and neck supple.  Lymphadenopathy:     Cervical: No cervical adenopathy.  Skin:    General: Skin is warm and dry.  Neurological:     Mental Status: He is alert and oriented to person, place, and time.  Psychiatric:        Behavior: Behavior normal.        Thought Content: Thought content normal.        Judgment: Judgment normal.    BP (!) 160/74   Pulse 66   Temp 97.7 F (36.5 C) (Temporal)   Resp 20   Ht 5\' 11"  (1.803 m)   Wt 231 lb (104.8 kg)   SpO2 92%   BMI 32.22 kg/m   EKG- NSR with Guadlupe Spanish, FNP  Chest xray- no acute or chronic findings      Assessment & Plan:   GOEBEL TRUMP in today with chief complaint of Pre-op Exam   1. Preoperative clearance Will just need lab work prior to surgery Cleared for  surgery - EKG 12-Lead - DG Chest 2 View    The above assessment and management plan was discussed with the patient. The patient verbalized understanding of and has agreed to the management plan. Patient is aware to call the clinic if symptoms persist or worsen. Patient is aware when to return to the clinic for a follow-up visit. Patient educated on when it is appropriate to go to the emergency department.   Mary-Margaret Daphine Deutscher, FNP

## 2022-07-03 ENCOUNTER — Other Ambulatory Visit: Payer: Self-pay | Admitting: Family Medicine

## 2022-07-03 DIAGNOSIS — E1142 Type 2 diabetes mellitus with diabetic polyneuropathy: Secondary | ICD-10-CM

## 2022-07-08 ENCOUNTER — Ambulatory Visit (INDEPENDENT_AMBULATORY_CARE_PROVIDER_SITE_OTHER): Payer: Medicare Other | Admitting: Family Medicine

## 2022-07-08 ENCOUNTER — Telehealth: Payer: Self-pay | Admitting: Family Medicine

## 2022-07-08 ENCOUNTER — Encounter: Payer: Self-pay | Admitting: Family Medicine

## 2022-07-08 VITALS — BP 157/80 | HR 71 | Ht 71.0 in | Wt 236.0 lb

## 2022-07-08 DIAGNOSIS — I152 Hypertension secondary to endocrine disorders: Secondary | ICD-10-CM | POA: Diagnosis not present

## 2022-07-08 DIAGNOSIS — E1159 Type 2 diabetes mellitus with other circulatory complications: Secondary | ICD-10-CM

## 2022-07-08 DIAGNOSIS — E1142 Type 2 diabetes mellitus with diabetic polyneuropathy: Secondary | ICD-10-CM

## 2022-07-08 DIAGNOSIS — Z7984 Long term (current) use of oral hypoglycemic drugs: Secondary | ICD-10-CM | POA: Diagnosis not present

## 2022-07-08 DIAGNOSIS — Z125 Encounter for screening for malignant neoplasm of prostate: Secondary | ICD-10-CM

## 2022-07-08 DIAGNOSIS — E119 Type 2 diabetes mellitus without complications: Secondary | ICD-10-CM | POA: Insufficient documentation

## 2022-07-08 DIAGNOSIS — E039 Hypothyroidism, unspecified: Secondary | ICD-10-CM

## 2022-07-08 LAB — BAYER DCA HB A1C WAIVED: HB A1C (BAYER DCA - WAIVED): 7.1 % — ABNORMAL HIGH (ref 4.8–5.6)

## 2022-07-08 NOTE — Progress Notes (Signed)
BP (!) 157/80   Pulse 71   Ht 5\' 11"  (1.803 m)   Wt 236 lb (107 kg)   SpO2 95%   BMI 32.92 kg/m    Subjective:   Patient ID: Stephen Lawson, male    DOB: 10-Nov-1950, 72 y.o.   MRN: [id number]  HPI: Stephen Lawson is a 72 y.o. male presenting on 07/08/2022 for Medical Management of Chronic Issues, Diabetes, and Hypertension   HPI Type 2 diabetes mellitus Patient comes in today for recheck of his diabetes. Patient has been currently taking metformin. Patient is currently on an ACE inhibitor/ARB. Patient has not seen an ophthalmologist this year. Patient denies any new issues with their feet. The symptom started onset as an adult hypertension and hypothyroidism ARE RELATED TO DM   Hypertension Patient is currently on amlodipine and lisinopril, and their blood pressure today is 157/80. Patient denies any lightheadedness or dizziness. Patient denies headaches, blurred vision, chest pains, shortness of breath, or weakness. Denies any side effects from medication and is content with current medication.   Hypothyroidism recheck Patient is coming in for thyroid recheck today as well. They deny any issues with hair changes or heat or cold problems or diarrhea or constipation. They deny any chest pain or palpitations. They are currently on levothyroxine 112 micrograms   Relevant past medical, surgical, family and social history reviewed and updated as indicated. Interim medical history since our last visit reviewed. Allergies and medications reviewed and updated.  Review of Systems  Constitutional:  Negative for chills and fever.  Eyes:  Negative for visual disturbance.  Respiratory:  Negative for shortness of breath and wheezing.   Cardiovascular:  Negative for chest pain and leg swelling.  Musculoskeletal:  Negative for back pain and gait problem.  Skin:  Negative for rash.  Neurological:  Negative for dizziness, weakness and light-headedness.  All other systems reviewed and are  negative.   Per HPI unless specifically indicated above   Allergies as of 07/08/2022       Reactions   Darvocet [propoxyphene N-acetaminophen]    Itching   Talwin [pentazocine]    itching   Ultram [tramadol]         Medication List        Accurate as of July 08, 2022 10:36 AM. If you have any questions, ask your nurse or doctor.          amLODipine 10 MG tablet Commonly known as: NORVASC Take 1 tablet (10 mg total) by mouth daily.   atenolol 100 MG tablet Commonly known as: TENORMIN Take 1 tablet (100 mg total) by mouth daily.   blood glucose meter kit and supplies Kit Dispense based on patient and insurance preference. Use up to four times daily as directed. (FOR ICD-9 250.00, 250.01).   clobetasol cream 0.05 % Commonly known as: TEMOVATE Apply 1 application topically 2 (two) times daily.   glimepiride 4 MG tablet Commonly known as: AMARYL Take 1 tablet (4 mg total) by mouth daily before breakfast.   levothyroxine 112 MCG tablet Commonly known as: SYNTHROID Take 1 tablet (112 mcg total) by mouth daily.   lisinopril 20 MG tablet Commonly known as: ZESTRIL Take 1 tablet (20 mg total) by mouth daily.   metFORMIN 500 MG tablet Commonly known as: GLUCOPHAGE Take 2 tablets (1,000 mg total) by mouth 2 (two) times daily with a meal.   methadone 10 MG tablet Commonly known as: DOLOPHINE Take 10 mg by mouth 2 (two) times daily.  Objective:   BP (!) 157/80   Pulse 71   Ht 5\' 11"  (1.803 m)   Wt 236 lb (107 kg)   SpO2 95%   BMI 32.92 kg/m   Wt Readings from Last 3 Encounters:  07/08/22 236 lb (107 kg)  06/21/22 231 lb (104.8 kg)  04/08/22 237 lb (107.5 kg)    Physical Exam Vitals and nursing note reviewed.  Constitutional:      General: He is not in acute distress.    Appearance: He is well-developed. He is not diaphoretic.  Eyes:     General: No scleral icterus.    Conjunctiva/sclera: Conjunctivae normal.  Neck:     Thyroid: No  thyromegaly.  Cardiovascular:     Rate and Rhythm: Normal rate and regular rhythm.     Heart sounds: Normal heart sounds. No murmur heard. Pulmonary:     Effort: Pulmonary effort is normal. No respiratory distress.     Breath sounds: Normal breath sounds. No wheezing.  Musculoskeletal:        General: No swelling. Normal range of motion.     Cervical back: Neck supple.  Lymphadenopathy:     Cervical: No cervical adenopathy.  Skin:    General: Skin is warm and dry.     Findings: No rash.  Neurological:     Mental Status: He is alert and oriented to person, place, and time.     Coordination: Coordination normal.  Psychiatric:        Behavior: Behavior normal.       Assessment & Plan:   Problem List Items Addressed This Visit       Cardiovascular and Mediastinum   Hypertension associated with diabetes (HCC)   Relevant Orders   CBC with Differential/Platelet   CMP14+EGFR   Lipid panel   TSH   Bayer DCA Hb A1c Waived     Endocrine   T2DM (type 2 diabetes mellitus) (HCC) - Primary   Relevant Orders   CBC with Differential/Platelet   CMP14+EGFR   Lipid panel   TSH   Bayer DCA Hb A1c Waived   Microalbumin / creatinine urine ratio   Hypothyroid   Relevant Orders   CBC with Differential/Platelet   CMP14+EGFR   Lipid panel   TSH   Bayer DCA Hb A1c Waived   Diabetes mellitus treated with oral medication (HCC)   Other Visit Diagnoses     Prostate cancer screening           A1c is 7.1.  Mainly focus on diet at this point.  Blood pressure elevated today.  Then we waited a few minutes and then rechecked it was 120/70.  He just continue to monitor. Spent time discussing chronic care management including hypertension diabetes and hypothyroidism Follow up plan: Return in about 3 months (around 10/08/2022), or if symptoms worsen or fail to improve, for dm and htn and thyroid recheck.  Counseling provided for all of the vaccine components Orders Placed This Encounter   Procedures   CBC with Differential/Platelet   CMP14+EGFR   Lipid panel   TSH   Bayer DCA Hb A1c Waived   Microalbumin / creatinine urine ratio    Arville Care, MD [facility] 07/08/2022, 10:36 AM

## 2022-07-09 LAB — TSH: TSH: 3.03 u[IU]/mL (ref 0.450–4.500)

## 2022-07-09 LAB — CMP14+EGFR
ALT: 20 IU/L (ref 0–44)
AST: 16 IU/L (ref 0–40)
Albumin: 4.4 g/dL (ref 3.8–4.8)
Alkaline Phosphatase: 101 IU/L (ref 44–121)
BUN/Creatinine Ratio: 15 (ref 10–24)
BUN: 14 mg/dL (ref 8–27)
Bilirubin Total: 0.2 mg/dL (ref 0.0–1.2)
CO2: 25 mmol/L (ref 20–29)
Calcium: 9.4 mg/dL (ref 8.6–10.2)
Chloride: 100 mmol/L (ref 96–106)
Creatinine, Ser: 0.96 mg/dL (ref 0.76–1.27)
Globulin, Total: 2.2 g/dL (ref 1.5–4.5)
Glucose: 162 mg/dL — ABNORMAL HIGH (ref 70–99)
Potassium: 4.6 mmol/L (ref 3.5–5.2)
Sodium: 141 mmol/L (ref 134–144)
Total Protein: 6.6 g/dL (ref 6.0–8.5)
eGFR: 85 mL/min/{1.73_m2} (ref >59)

## 2022-07-09 LAB — CBC WITH DIFFERENTIAL/PLATELET
Basophils Absolute: 0.1 10*3/uL (ref 0.0–0.2)
Basos: 1 %
EOS (ABSOLUTE): 0.3 10*3/uL (ref 0.0–0.4)
Eos: 3 %
Hematocrit: 45.4 % (ref 37.5–51.0)
Hemoglobin: 14.3 g/dL (ref 13.0–17.7)
Immature Grans (Abs): 0 10*3/uL (ref 0.0–0.1)
Immature Granulocytes: 1 %
Lymphocytes Absolute: 2.2 10*3/uL (ref 0.7–3.1)
Lymphs: 25 %
MCH: 27.1 pg (ref 26.6–33.0)
MCHC: 31.5 g/dL (ref 31.5–35.7)
MCV: 86 fL (ref 79–97)
Monocytes Absolute: 0.7 10*3/uL (ref 0.1–0.9)
Monocytes: 8 %
Neutrophils Absolute: 5.5 10*3/uL (ref 1.4–7.0)
Neutrophils: 62 %
Platelets: 278 10*3/uL (ref 150–450)
RBC: 5.28 x10E6/uL (ref 4.14–5.80)
RDW: 13.5 % (ref 11.6–15.4)
WBC: 8.7 10*3/uL (ref 3.4–10.8)

## 2022-07-09 LAB — LIPID PANEL
Chol/HDL Ratio: 3.2 ratio (ref 0.0–5.0)
Cholesterol, Total: 137 mg/dL (ref 100–199)
HDL: 43 mg/dL (ref >39)
LDL Chol Calc (NIH): 80 mg/dL (ref 0–99)
Triglycerides: 69 mg/dL (ref 0–149)
VLDL Cholesterol Cal: 14 mg/dL (ref 5–40)

## 2022-07-11 NOTE — Telephone Encounter (Signed)
Faxed through epic °

## 2022-07-28 DIAGNOSIS — Z79891 Long term (current) use of opiate analgesic: Secondary | ICD-10-CM | POA: Diagnosis not present

## 2022-08-02 DIAGNOSIS — L820 Inflamed seborrheic keratosis: Secondary | ICD-10-CM | POA: Diagnosis not present

## 2022-08-02 DIAGNOSIS — L570 Actinic keratosis: Secondary | ICD-10-CM | POA: Diagnosis not present

## 2022-08-02 DIAGNOSIS — L218 Other seborrheic dermatitis: Secondary | ICD-10-CM | POA: Diagnosis not present

## 2022-08-02 DIAGNOSIS — Z85828 Personal history of other malignant neoplasm of skin: Secondary | ICD-10-CM | POA: Diagnosis not present

## 2022-08-16 ENCOUNTER — Other Ambulatory Visit: Payer: Self-pay | Admitting: Orthopedic Surgery

## 2022-08-24 DIAGNOSIS — M1611 Unilateral primary osteoarthritis, right hip: Secondary | ICD-10-CM | POA: Diagnosis not present

## 2022-08-24 DIAGNOSIS — R262 Difficulty in walking, not elsewhere classified: Secondary | ICD-10-CM | POA: Diagnosis not present

## 2022-08-24 DIAGNOSIS — M25651 Stiffness of right hip, not elsewhere classified: Secondary | ICD-10-CM | POA: Diagnosis not present

## 2022-08-24 NOTE — Progress Notes (Signed)
Sent message, via epic in basket, requesting orders in epic from surgeon.  

## 2022-08-29 DIAGNOSIS — M545 Low back pain, unspecified: Secondary | ICD-10-CM | POA: Diagnosis not present

## 2022-08-30 NOTE — Patient Instructions (Signed)
DUE TO COVID-19 ONLY TWO VISITORS  (aged 71 and older)  ARE ALLOWED TO COME WITH YOU AND STAY IN THE WAITING ROOM ONLY DURING PRE OP AND PROCEDURE.   **NO VISITORS ARE ALLOWED IN THE SHORT STAY AREA OR RECOVERY ROOM!!**  IF YOU WILL BE ADMITTED INTO THE HOSPITAL YOU ARE ALLOWED ONLY FOUR SUPPORT PEOPLE DURING VISITATION HOURS ONLY (7 AM -8PM)   The support person(s) must pass our screening, gel in and out, and wear a mask at all times, including in the patient's room. Patients must also wear a mask when staff or their support person are in the room. Visitors GUEST BADGE MUST BE WORN VISIBLY  One adult visitor may remain with you overnight and MUST be in the room by 8 P.M.     Your procedure is scheduled on: 09/12/22   Report to [facility]    Report to admitting at: 7:20 AM   Call this number if you have problems the morning of surgery [phone]   Do not eat food :After Midnight.   After Midnight you may have the following liquids until : 6:45 AM DAY OF SURGERY  Water Black Coffee (sugar ok, NO MILK/CREAM OR CREAMERS)  Tea (sugar ok, NO MILK/CREAM OR CREAMERS) regular and decaf                             Plain Jell-O (NO RED)                                           Fruit ices (not with fruit pulp, NO RED)                                     Popsicles (NO RED)                                                                  Juice: apple, WHITE grape, WHITE cranberry Sports drinks like Gatorade (NO RED)   The day of surgery:  Drink ONE (1) Pre-Surgery Clear G2 at : 6:45 AM the morning of surgery. Drink in one sitting. Do not sip.  This drink was given to you during your hospital  pre-op appointment visit. Nothing else to drink after completing the  Pre-Surgery Clear Ensure or G2.          If you have questions, please contact your surgeon's office.  FOLLOW ANY ADDITIONAL PRE OP INSTRUCTIONS YOU RECEIVED FROM YOUR SURGEON'S OFFICE!!!   Oral Hygiene is  also important to reduce your risk of infection.                                    Remember - BRUSH YOUR TEETH THE MORNING OF SURGERY WITH YOUR REGULAR TOOTHPASTE  DENTURES WILL BE REMOVED PRIOR TO SURGERY PLEASE DO NOT APPLY "Poly grip" OR ADHESIVES!!!   Do NOT smoke after Midnight   Take these medicines the morning of surgery with  A SIP OF WATER: atenolol,amlodipine,levothyroxine.  DO NOT TAKE ANY ORAL DIABETIC MEDICATIONS DAY OF YOUR SURGERY                              You may not have any metal on your body including hair pins, jewelry, and body piercing             Do not wear lotions, powders, perfumes/cologne, or deodorant              Men may shave face and neck.   Do not bring valuables to the hospital. [facility] IS NOT             RESPONSIBLE   FOR VALUABLES.   Contacts, glasses, or bridgework may not be worn into surgery.   Bring small overnight bag day of surgery.   DO NOT BRING YOUR HOME MEDICATIONS TO THE HOSPITAL. PHARMACY WILL DISPENSE MEDICATIONS LISTED ON YOUR MEDICATION LIST TO YOU DURING YOUR ADMISSION IN THE HOSPITAL!    Patients discharged on the day of surgery will not be allowed to drive home.  Someone NEEDS to stay with you for the first 24 hours after anesthesia.   Special Instructions: Bring a copy of your healthcare power of attorney and living will documents         the day of surgery if you haven't scanned them before.              Please read over the following fact sheets you were given: IF YOU HAVE QUESTIONS ABOUT YOUR PRE-OP INSTRUCTIONS PLEASE CALL [phone]      Pre-operative 5 CHG Bath Instructions   You can play a key role in reducing the risk of infection after surgery. Your skin needs to be as free of germs as possible. You can reduce the number of germs on your skin by washing with CHG (chlorhexidine gluconate) soap before surgery. CHG is an antiseptic soap that kills germs and continues to kill germs even after washing.   DO NOT  use if you have an allergy to chlorhexidine/CHG or antibacterial soaps. If your skin becomes reddened or irritated, stop using the CHG and notify one of our RNs at : [phone].   Please shower with the CHG soap starting 4 days before surgery using the following schedule:     Please keep in mind the following:  DO NOT shave, including legs and underarms, starting the day of your first shower.   You may shave your face at any point before/day of surgery.  Place clean sheets on your bed the day you start using CHG soap. Use a clean washcloth (not used since being washed) for each shower. DO NOT sleep with pets once you start using the CHG.   CHG Shower Instructions:  If you choose to wash your hair and private area, wash first with your normal shampoo/soap.  After you use shampoo/soap, rinse your hair and body thoroughly to remove shampoo/soap residue.  Turn the water OFF and apply about 3 tablespoons (45 ml) of CHG soap to a CLEAN washcloth.  Apply CHG soap ONLY FROM YOUR NECK DOWN TO YOUR TOES (washing for 3-5 minutes)  DO NOT use CHG soap on face, private areas, open wounds, or sores.  Pay special attention to the area where your surgery is being performed.  If you are having back surgery, having someone wash your back for you may be helpful. Wait 2  minutes after CHG soap is applied, then you may rinse off the CHG soap.  Pat dry with a clean towel  Put on clean clothes/pajamas   If you choose to wear lotion, please use ONLY the CHG-compatible lotions on the back of this paper.     Additional instructions for the day of surgery: DO NOT APPLY any lotions, deodorants, cologne, or perfumes.   Put on clean/comfortable clothes.  Brush your teeth.  Ask your nurse before applying any prescription medications to the skin.    CHG Compatible Lotions   Aveeno Moisturizing lotion  Cetaphil Moisturizing Cream  Cetaphil Moisturizing Lotion  Clairol Herbal Essence Moisturizing Lotion, Dry  Skin  Clairol Herbal Essence Moisturizing Lotion, Extra Dry Skin  Clairol Herbal Essence Moisturizing Lotion, Normal Skin  Curel Age Defying Therapeutic Moisturizing Lotion with Alpha Hydroxy  Curel Extreme Care Body Lotion  Curel Soothing Hands Moisturizing Hand Lotion  Curel Therapeutic Moisturizing Cream, Fragrance-Free  Curel Therapeutic Moisturizing Lotion, Fragrance-Free  Curel Therapeutic Moisturizing Lotion, Original Formula  Eucerin Daily Replenishing Lotion  Eucerin Dry Skin Therapy Plus Alpha Hydroxy Crme  Eucerin Dry Skin Therapy Plus Alpha Hydroxy Lotion  Eucerin Original Crme  Eucerin Original Lotion  Eucerin Plus Crme Eucerin Plus Lotion  Eucerin TriLipid Replenishing Lotion  Keri Anti-Bacterial Hand Lotion  Keri Deep Conditioning Original Lotion Dry Skin Formula Softly Scented  Keri Deep Conditioning Original Lotion, Fragrance Free Sensitive Skin Formula  Keri Lotion Fast Absorbing Fragrance Free Sensitive Skin Formula  Keri Lotion Fast Absorbing Softly Scented Dry Skin Formula  Keri Original Lotion  Keri Skin Renewal Lotion Keri Silky Smooth Lotion  Keri Silky Smooth Sensitive Skin Lotion  Nivea Body Creamy Conditioning Oil  Nivea Body Extra Enriched Lotion  Nivea Body Original Lotion  Nivea Body Sheer Moisturizing Lotion Nivea Crme  Nivea Skin Firming Lotion  NutraDerm 30 Skin Lotion  NutraDerm Skin Lotion  NutraDerm Therapeutic Skin Cream  NutraDerm Therapeutic Skin Lotion  ProShield Protective Hand Cream  Provon moisturizing lotion   Incentive Spirometer  An incentive spirometer is a tool that can help keep your lungs clear and active. This tool measures how well you are filling your lungs with each breath. Taking long deep breaths may help reverse or decrease the chance of developing breathing (pulmonary) problems (especially infection) following: A long period of time when you are unable to move or be active. BEFORE THE PROCEDURE  If the spirometer  includes an indicator to show your best effort, your nurse or respiratory therapist will set it to a desired goal. If possible, sit up straight or lean slightly forward. Try not to slouch. Hold the incentive spirometer in an upright position. INSTRUCTIONS FOR USE  Sit on the edge of your bed if possible, or sit up as far as you can in bed or on a chair. Hold the incentive spirometer in an upright position. Breathe out normally. Place the mouthpiece in your mouth and seal your lips tightly around it. Breathe in slowly and as deeply as possible, raising the piston or the ball toward the top of the column. Hold your breath for 3-5 seconds or for as long as possible. Allow the piston or ball to fall to the bottom of the column. Remove the mouthpiece from your mouth and breathe out normally. Rest for a few seconds and repeat Steps 1 through 7 at least 10 times every 1-2 hours when you are awake. Take your time and take a few normal breaths between deep breaths. The spirometer  may include an indicator to show your best effort. Use the indicator as a goal to work toward during each repetition. After each set of 10 deep breaths, practice coughing to be sure your lungs are clear. If you have an incision (the cut made at the time of surgery), support your incision when coughing by placing a pillow or rolled up towels firmly against it. Once you are able to get out of bed, walk around indoors and cough well. You may stop using the incentive spirometer when instructed by your caregiver.  RISKS AND COMPLICATIONS Take your time so you do not get dizzy or light-headed. If you are in pain, you may need to take or ask for pain medication before doing incentive spirometry. It is harder to take a deep breath if you are having pain. AFTER USE Rest and breathe slowly and easily. It can be helpful to keep track of a log of your progress. Your caregiver can provide you with a simple table to help with this. If you are  using the spirometer at home, follow these instructions: SEEK MEDICAL CARE IF:  You are having difficultly using the spirometer. You have trouble using the spirometer as often as instructed. Your pain medication is not giving enough relief while using the spirometer. You develop fever of 100.5 F (38.1 C) or higher. SEEK IMMEDIATE MEDICAL CARE IF:  You cough up bloody sputum that had not been present before. You develop fever of 102 F (38.9 C) or greater. You develop worsening pain at or near the incision site. MAKE SURE YOU:  Understand these instructions. Will watch your condition. Will get help right away if you are not doing well or get worse. Document Released: 05/02/2006 Document Revised: 03/14/2011 Document Reviewed: 07/03/2006 [facility] Patient Information 2014 [place], [place].   ________________________________________________________________________

## 2022-08-31 ENCOUNTER — Encounter (HOSPITAL_COMMUNITY): Payer: Self-pay

## 2022-08-31 ENCOUNTER — Other Ambulatory Visit: Payer: Self-pay

## 2022-08-31 ENCOUNTER — Encounter (HOSPITAL_COMMUNITY)
Admission: RE | Admit: 2022-08-31 | Discharge: 2022-08-31 | Disposition: A | Discharge status: Home / Self Care | Payer: Medicare Other | Source: Ambulatory Visit | Attending: Orthopedic Surgery | Admitting: Orthopedic Surgery

## 2022-08-31 VITALS — BP 155/79 | HR 70 | Temp 98.4°F | Ht 71.0 in | Wt 236.0 lb

## 2022-08-31 DIAGNOSIS — Z01818 Encounter for other preprocedural examination: Secondary | ICD-10-CM

## 2022-08-31 DIAGNOSIS — Z7984 Long term (current) use of oral hypoglycemic drugs: Secondary | ICD-10-CM | POA: Insufficient documentation

## 2022-08-31 DIAGNOSIS — I152 Hypertension secondary to endocrine disorders: Secondary | ICD-10-CM | POA: Diagnosis not present

## 2022-08-31 DIAGNOSIS — E1159 Type 2 diabetes mellitus with other circulatory complications: Secondary | ICD-10-CM | POA: Diagnosis not present

## 2022-08-31 DIAGNOSIS — M1611 Unilateral primary osteoarthritis, right hip: Secondary | ICD-10-CM | POA: Diagnosis not present

## 2022-08-31 DIAGNOSIS — M25651 Stiffness of right hip, not elsewhere classified: Secondary | ICD-10-CM | POA: Diagnosis not present

## 2022-08-31 DIAGNOSIS — R262 Difficulty in walking, not elsewhere classified: Secondary | ICD-10-CM | POA: Diagnosis not present

## 2022-08-31 DIAGNOSIS — E119 Type 2 diabetes mellitus without complications: Secondary | ICD-10-CM

## 2022-08-31 DIAGNOSIS — Z01812 Encounter for preprocedural laboratory examination: Secondary | ICD-10-CM | POA: Diagnosis not present

## 2022-08-31 HISTORY — DX: Unspecified osteoarthritis, unspecified site: M19.90

## 2022-08-31 HISTORY — DX: Hypothyroidism, unspecified: E03.9

## 2022-08-31 HISTORY — DX: Malignant (primary) neoplasm, unspecified: C80.1

## 2022-08-31 HISTORY — DX: Migraine, unspecified, not intractable, without status migrainosus: G43.909

## 2022-08-31 HISTORY — DX: Type 2 diabetes mellitus without complications: E11.9

## 2022-08-31 LAB — CBC
HCT: 47.4 % (ref 39.0–52.0)
Hemoglobin: 14.6 g/dL (ref 13.0–17.0)
MCH: 26.7 pg (ref 26.0–34.0)
MCHC: 30.8 g/dL (ref 30.0–36.0)
MCV: 86.7 fL (ref 80.0–100.0)
Platelets: 321 10*3/uL (ref 150–400)
RBC: 5.47 MIL/uL (ref 4.22–5.81)
RDW: 13.5 % (ref 11.5–15.5)
WBC: 8.1 10*3/uL (ref 4.0–10.5)
nRBC: 0 % (ref 0.0–0.2)

## 2022-08-31 LAB — BASIC METABOLIC PANEL WITH GFR
Anion gap: 9 (ref 5–15)
BUN: 13 mg/dL (ref 8–23)
CO2: 28 mmol/L (ref 22–32)
Calcium: 9.2 mg/dL (ref 8.9–10.3)
Chloride: 101 mmol/L (ref 98–111)
Creatinine, Ser: 0.88 mg/dL (ref 0.61–1.24)
GFR, Estimated: >60 mL/min (ref >60)
Glucose, Bld: 168 mg/dL — ABNORMAL HIGH (ref 70–99)
Potassium: 4.3 mmol/L (ref 3.5–5.1)
Sodium: 138 mmol/L (ref 135–145)

## 2022-08-31 LAB — TYPE AND SCREEN
ABO/RH(D): O POS
Antibody Screen: NEGATIVE

## 2022-08-31 LAB — SURGICAL PCR SCREEN
MRSA, PCR: NEGATIVE
Staphylococcus aureus: NEGATIVE

## 2022-08-31 LAB — GLUCOSE, CAPILLARY: Glucose-Capillary: 172 mg/dL — ABNORMAL HIGH (ref 70–99)

## 2022-08-31 NOTE — Progress Notes (Signed)
For Short Stay: COVID SWAB appointment date:  Bowel Prep reminder:   For Anesthesia: PCP - Dettinger, Elige Radon, MD . LOV: 07/08/22 Cardiologist -   Chest x-ray - 06/25/22 EKG - 06/21/22 Stress Test -  ECHO - 12/17/12 Cardiac Cath -  Pacemaker/ICD device last checked: Pacemaker orders received: Device Rep notified:  Spinal Cord Stimulator: N/A  Sleep Study - N/A CPAP -   Fasting Blood Sugar - N/A Checks Blood Sugar ___0__ times a day Date and result of last Hgb A1c- 7.1: 07/08/22  Last dose of GLP1 agonist- N/A GLP1 instructions:   Last dose of SGLT-2 inhibitors- N/A SGLT-2 instructions:   Blood Thinner Instructions: N/A Aspirin Instructions: Last Dose:  Activity level: Can go up a flight of stairs and activities of daily living without stopping and without chest pain and/or shortness of breath   Able to exercise without chest pain and/or shortness of breath    Anesthesia review: Hx: HTN,DIA  Patient denies shortness of breath, fever, cough and chest pain at PAT appointment   Patient verbalized understanding of instructions that were given to them at the PAT appointment. Patient was also instructed that they will need to review over the PAT instructions again at home before surgery.

## 2022-09-01 DIAGNOSIS — M25532 Pain in left wrist: Secondary | ICD-10-CM | POA: Diagnosis not present

## 2022-09-01 DIAGNOSIS — M25551 Pain in right hip: Secondary | ICD-10-CM | POA: Diagnosis not present

## 2022-09-01 DIAGNOSIS — M1611 Unilateral primary osteoarthritis, right hip: Secondary | ICD-10-CM | POA: Diagnosis not present

## 2022-09-02 DIAGNOSIS — Z79891 Long term (current) use of opiate analgesic: Secondary | ICD-10-CM | POA: Diagnosis not present

## 2022-09-07 DIAGNOSIS — M1611 Unilateral primary osteoarthritis, right hip: Secondary | ICD-10-CM | POA: Diagnosis not present

## 2022-09-07 DIAGNOSIS — M25651 Stiffness of right hip, not elsewhere classified: Secondary | ICD-10-CM | POA: Diagnosis not present

## 2022-09-07 DIAGNOSIS — R262 Difficulty in walking, not elsewhere classified: Secondary | ICD-10-CM | POA: Diagnosis not present

## 2022-09-09 ENCOUNTER — Encounter (HOSPITAL_COMMUNITY): Payer: Self-pay | Admitting: Orthopedic Surgery

## 2022-09-09 NOTE — H&P (Signed)
Chief Complaint: right hip pain  HPI: Stephen Lawson is a 72 y.o. male who presents for evaluation of right hip pain. It has been present for several years and has been worsening. He has failed conservative measures. Pain is rated as severe.  Past Medical History:  Diagnosis Date   Arthritis    Back pain    Cancer (HCC)    skin cancer removed from the face   Diabetes mellitus without complication (HCC)    Hypertension    Hypothyroidism    Migraine    Past Surgical History:  Procedure Laterality Date   ARTHROSCOPY WITH ANTERIOR CRUCIATE LIGAMENT (ACL) REPAIR WITH ANTERIOR TIBILIAS GRAFT Right    BACK SURGERY     x 4   ELBOW SURGERY Bilateral    SHOULDER SURGERY Right    arthroscopy   TESTICLE SURGERY     WRIST SURGERY Right    carpal tunnel   Social History   Socioeconomic History   Marital status: Married    Spouse name: Not on file   Number of children: 2   Years of education: Not on file   Highest education level: Associate degree: occupational, Scientist, product/process development, or vocational program  Occupational History   Occupation: retired  Tobacco Use   Smoking status: Former    Current packs/day: 0.00    Average packs/day: 0.3 packs/day for 2.0 years (0.5 ttl pk-yrs)    Types: Cigarettes    Start date: 01/04/1967    Quit date: 01/03/1969    Years since quitting: 53.7   Smokeless tobacco: Current  Vaping Use   Vaping status: Never Used  Substance and Sexual Activity   Alcohol use: No   Drug use: No   Sexual activity: Not on file  Other Topics Concern   Not on file  Social History Narrative   Son living with them right now   Social Determinants of Health   Financial Resource Strain: Low Risk  (01/07/2021)   Overall Financial Resource Strain (CARDIA)    Difficulty of Paying Living Expenses: Not hard at all  Food Insecurity: No Food Insecurity (04/04/2022)   Hunger Vital Sign    Worried About Running Out of Food in the Last Year: Never true    Ran Out of Food in the Last Year:  Never true  Transportation Needs: No Transportation Needs (04/04/2022)   PRAPARE - Administrator, Civil Service (Medical): No    Lack of Transportation (Non-Medical): No  Physical Activity: Unknown (04/04/2022)   Exercise Vital Sign    Days of Exercise per Week: 0 days    Minutes of Exercise per Session: Not on file  Stress: No Stress Concern Present (04/04/2022)   [employer] of Occupational Health - Occupational Stress Questionnaire    Feeling of Stress : Not at all  Social Connections: Unknown (06/09/2022)   Received from [facility]   Social Network    Social Network: Not on file   Family History  Problem Relation Age of Onset   Heart disease Mother    Heart disease Father    Diabetes Brother    Allergies  Allergen Reactions   Darvocet [Propoxyphene N-Acetaminophen] Itching        Talwin [Pentazocine] Itching   Ultram [Tramadol]    Prior to Admission medications   Medication Sig Start Date End Date Taking? Authorizing Provider  amLODipine (NORVASC) 10 MG tablet Take 1 tablet (10 mg total) by mouth daily. 01/06/22  Yes Dettinger, Elige Radon, MD  atenolol (  TENORMIN) 100 MG tablet Take 1 tablet (100 mg total) by mouth daily. 01/06/22  Yes Dettinger, Elige Radon, MD  glimepiride (AMARYL) 4 MG tablet Take 1 tablet (4 mg total) by mouth daily before breakfast. 04/08/22  Yes Dettinger, Elige Radon, MD  hydrocortisone 2.5 % cream Apply 1 Application topically 2 (two) times daily as needed (dermatitis). 08/02/22  Yes [provider]  levothyroxine (SYNTHROID) 112 MCG tablet Take 1 tablet (112 mcg total) by mouth daily. 01/06/22  Yes Dettinger, Elige Radon, MD  lisinopril (ZESTRIL) 20 MG tablet Take 1 tablet (20 mg total) by mouth daily. 04/08/22  Yes Dettinger, Elige Radon, MD  metFORMIN (GLUCOPHAGE) 500 MG tablet Take 2 tablets (1,000 mg total) by mouth 2 (two) times daily with a meal. 01/06/22  Yes Dettinger, Elige Radon, MD  methadone (DOLOPHINE) 10 MG tablet Take 10 mg by mouth 2 (two)  times daily.   Yes [provider]  blood glucose meter kit and supplies KIT Dispense based on patient and insurance preference. Use up to four times daily as directed. (FOR ICD-9 250.00, 250.01). 07/01/14   Mechele Claude, MD  clobetasol cream (TEMOVATE) 0.05 % Apply 1 application topically 2 (two) times daily. Patient not taking: Reported on 08/19/2022 09/19/13   Deatra Canter, FNP     Positive ROS: positive for joint pain  All other systems have been reviewed and were otherwise negative with the exception of those mentioned in the HPI and as above.  Physical Exam: There were no vitals filed for this visit.  General: Alert, no acute distress Cardiovascular: No pedal edema Respiratory: No cyanosis, no use of accessory musculature GI: No organomegaly, abdomen is soft and non-tender Skin: No lesions in the area of chief complaint Neurologic: Sensation intact distally Psychiatric: Patient is competent for consent with normal mood and affect Lymphatic: No axillary or cervical lymphadenopathy  MUSCULOSKELETAL: right hip exam: Diffuse tenderness.  Limited internal and external rotation of the hip with pain.  Negative straight leg raise.  Neurovascular status is intact to the right lower extremity.  Assessment/Plan: RIGHT HIP OSTEOARTHRITIS Plan for Procedure(s):right TOTAL HIP ARTHROPLASTY ANTERIOR APPROACH The risks and benefits of the procedure were discussed with the patient.  He is ready to move forward with a right anterior total hip replacement on 09/12/22.

## 2022-09-09 NOTE — Care Plan (Signed)
Ortho Bundle Case Management Note  Patient Details  Name: Stephen Lawson MRN: [id number] Date of Birth: Feb 21, 1950  spoke with patient on the phone. he will discharge to home with son to assist. has a rolling walker. [facility] discharge instructions mailed and discussed. he will call with questions                    DME Arranged:    DME Agency:     HH Arranged:    HH Agency:     Additional Comments: Please contact me with any questions of if this plan should need to change.  Shauna Hugh,  RN,BSN,MHA,CCM  [facility]opaedic Specialist  [phone] 09/09/2022, 11:25 AM

## 2022-09-12 ENCOUNTER — Encounter (HOSPITAL_COMMUNITY): Payer: Self-pay | Admitting: Orthopedic Surgery

## 2022-09-12 ENCOUNTER — Other Ambulatory Visit: Payer: Self-pay

## 2022-09-12 ENCOUNTER — Ambulatory Visit (HOSPITAL_COMMUNITY): Payer: Medicare Other

## 2022-09-12 ENCOUNTER — Observation Stay (HOSPITAL_COMMUNITY)
Admission: RE | Admit: 2022-09-12 | Discharge: 2022-09-13 | Disposition: A | Discharge status: Home / Self Care | Payer: Medicare Other | Attending: Orthopedic Surgery | Admitting: Orthopedic Surgery

## 2022-09-12 ENCOUNTER — Encounter (HOSPITAL_COMMUNITY)
Admission: RE | Disposition: A | Discharge status: Home / Self Care | Payer: Self-pay | Source: Home / Self Care | Attending: Orthopedic Surgery

## 2022-09-12 ENCOUNTER — Ambulatory Visit (HOSPITAL_BASED_OUTPATIENT_CLINIC_OR_DEPARTMENT_OTHER): Payer: Medicare Other | Admitting: Certified Registered Nurse Anesthetist

## 2022-09-12 ENCOUNTER — Ambulatory Visit (HOSPITAL_COMMUNITY): Payer: Medicare Other | Admitting: Certified Registered Nurse Anesthetist

## 2022-09-12 DIAGNOSIS — E039 Hypothyroidism, unspecified: Secondary | ICD-10-CM | POA: Diagnosis not present

## 2022-09-12 DIAGNOSIS — M1611 Unilateral primary osteoarthritis, right hip: Secondary | ICD-10-CM

## 2022-09-12 DIAGNOSIS — Z7984 Long term (current) use of oral hypoglycemic drugs: Secondary | ICD-10-CM | POA: Diagnosis not present

## 2022-09-12 DIAGNOSIS — Z87891 Personal history of nicotine dependence: Secondary | ICD-10-CM | POA: Insufficient documentation

## 2022-09-12 DIAGNOSIS — Z79899 Other long term (current) drug therapy: Secondary | ICD-10-CM | POA: Diagnosis not present

## 2022-09-12 DIAGNOSIS — E119 Type 2 diabetes mellitus without complications: Secondary | ICD-10-CM | POA: Insufficient documentation

## 2022-09-12 DIAGNOSIS — Z85828 Personal history of other malignant neoplasm of skin: Secondary | ICD-10-CM | POA: Insufficient documentation

## 2022-09-12 DIAGNOSIS — I10 Essential (primary) hypertension: Secondary | ICD-10-CM | POA: Diagnosis not present

## 2022-09-12 DIAGNOSIS — Z96649 Presence of unspecified artificial hip joint: Secondary | ICD-10-CM

## 2022-09-12 DIAGNOSIS — Z96641 Presence of right artificial hip joint: Secondary | ICD-10-CM | POA: Diagnosis not present

## 2022-09-12 HISTORY — PX: TOTAL HIP ARTHROPLASTY: SHX124

## 2022-09-12 LAB — GLUCOSE, CAPILLARY
Glucose-Capillary: 150 mg/dL — ABNORMAL HIGH (ref 70–99)
Glucose-Capillary: 106 mg/dL — ABNORMAL HIGH (ref 70–99)
Glucose-Capillary: 270 mg/dL — ABNORMAL HIGH (ref 70–99)

## 2022-09-12 SURGERY — ARTHROPLASTY, HIP, TOTAL, ANTERIOR APPROACH
Anesthesia: Spinal | Site: Hip | Laterality: Right

## 2022-09-12 MED ORDER — OXYCODONE HCL 5 MG PO TABS
5.0000 mg | ORAL_TABLET | Freq: Once | ORAL | Status: AC | PRN
  Administered 2022-09-12: 5 mg via ORAL

## 2022-09-12 MED ORDER — KETOROLAC TROMETHAMINE 30 MG/ML IJ SOLN
30.0000 mg | Freq: Once | INTRAMUSCULAR | Status: AC
  Administered 2022-09-12: 30 mg via INTRAVENOUS

## 2022-09-12 MED ORDER — BUPIVACAINE LIPOSOME 1.3 % IJ SUSP
INTRAMUSCULAR | Status: DC | PRN
  Administered 2022-09-12: 10 mL

## 2022-09-12 MED ORDER — CELECOXIB 200 MG PO CAPS: 200.0000 mg | ORAL_CAPSULE | Freq: Two times a day (BID) | ORAL | 0 refills | Status: AC

## 2022-09-12 MED ORDER — HYDROMORPHONE HCL 1 MG/ML IJ SOLN
0.2500 mg | INTRAMUSCULAR | Status: DC | PRN
  Administered 2022-09-12 (×4): 0.5 mg via INTRAVENOUS

## 2022-09-12 MED ORDER — METHOCARBAMOL 500 MG IVPB - SIMPLE MED
500.0000 mg | Freq: Four times a day (QID) | INTRAVENOUS | Status: DC | PRN
  Administered 2022-09-12: 500 mg via INTRAVENOUS

## 2022-09-12 MED ORDER — INSULIN ASPART 100 UNIT/ML IJ SOLN: 0.0000 [IU] | INTRAMUSCULAR | Status: DC | PRN

## 2022-09-12 MED ORDER — HYDROMORPHONE HCL 1 MG/ML IJ SOLN
INTRAMUSCULAR | Status: AC
  Filled 2022-09-12: qty 1

## 2022-09-12 MED ORDER — AMLODIPINE BESYLATE 10 MG PO TABS
10.0000 mg | ORAL_TABLET | Freq: Every day | ORAL | Status: DC
  Administered 2022-09-12 – 2022-09-13 (×2): 10 mg via ORAL
  Filled 2022-09-12 (×2): qty 1

## 2022-09-12 MED ORDER — PHENYLEPHRINE HCL (PRESSORS) 10 MG/ML IV SOLN
INTRAVENOUS | Status: AC
  Filled 2022-09-12: qty 1

## 2022-09-12 MED ORDER — ASPIRIN 325 MG PO TBEC: 325.0000 mg | DELAYED_RELEASE_TABLET | Freq: Two times a day (BID) | ORAL | 0 refills | Status: DC

## 2022-09-12 MED ORDER — BUPIVACAINE-EPINEPHRINE 0.25% -1:200000 IJ SOLN
INTRAMUSCULAR | Status: AC
  Filled 2022-09-12: qty 1

## 2022-09-12 MED ORDER — BUPIVACAINE LIPOSOME 1.3 % IJ SUSP: 20.0000 mL | Freq: Once | INTRAMUSCULAR | Status: DC

## 2022-09-12 MED ORDER — DEXAMETHASONE SODIUM PHOSPHATE 4 MG/ML IJ SOLN
INTRAMUSCULAR | Status: DC | PRN
  Administered 2022-09-12: 8 mg via INTRAVENOUS

## 2022-09-12 MED ORDER — CEFAZOLIN SODIUM-DEXTROSE 2-4 GM/100ML-% IV SOLN
2.0000 g | INTRAVENOUS | Status: DC
  Filled 2022-09-12: qty 100

## 2022-09-12 MED ORDER — FENTANYL CITRATE PF 50 MCG/ML IJ SOSY
PREFILLED_SYRINGE | INTRAMUSCULAR | Status: AC
  Filled 2022-09-12: qty 2

## 2022-09-12 MED ORDER — 0.9 % SODIUM CHLORIDE (POUR BTL) OPTIME
TOPICAL | Status: DC | PRN
Start: 2022-09-12 — End: 2022-09-12
  Administered 2022-09-12: 1000 mL

## 2022-09-12 MED ORDER — BUPIVACAINE IN DEXTROSE 0.75-8.25 % IT SOLN
INTRATHECAL | Status: DC | PRN
Start: 2022-09-12 — End: 2022-09-12
  Administered 2022-09-12: 1.8 mL via INTRATHECAL

## 2022-09-12 MED ORDER — HYDROMORPHONE HCL 1 MG/ML IJ SOLN
0.5000 mg | INTRAMUSCULAR | Status: DC | PRN
  Administered 2022-09-12: 1 mg via INTRAVENOUS
  Filled 2022-09-12: qty 1

## 2022-09-12 MED ORDER — MIDAZOLAM HCL 2 MG/2ML IJ SOLN
INTRAMUSCULAR | Status: AC
  Filled 2022-09-12: qty 2

## 2022-09-12 MED ORDER — LACTATED RINGERS IV SOLN: INTRAVENOUS | Status: DC

## 2022-09-12 MED ORDER — BUPIVACAINE LIPOSOME 1.3 % IJ SUSP
INTRAMUSCULAR | Status: AC
  Filled 2022-09-12: qty 10

## 2022-09-12 MED ORDER — METHADONE HCL 10 MG PO TABS
10.0000 mg | ORAL_TABLET | Freq: Two times a day (BID) | ORAL | Status: DC
  Administered 2022-09-12 – 2022-09-13 (×2): 10 mg via ORAL
  Filled 2022-09-12 (×2): qty 1

## 2022-09-12 MED ORDER — OXYCODONE-ACETAMINOPHEN 5-325 MG PO TABS
1.0000 | ORAL_TABLET | Freq: Four times a day (QID) | ORAL | 0 refills | Status: DC | PRN
Start: 2022-09-12 — End: 2022-10-13

## 2022-09-12 MED ORDER — LACTATED RINGERS IV BOLUS: 250.0000 mL | Freq: Once | INTRAVENOUS | Status: DC

## 2022-09-12 MED ORDER — OXYCODONE HCL 5 MG PO TABS
5.0000 mg | ORAL_TABLET | ORAL | Status: DC | PRN
  Administered 2022-09-12 – 2022-09-13 (×2): 10 mg via ORAL
  Filled 2022-09-12 (×2): qty 2

## 2022-09-12 MED ORDER — MIDAZOLAM HCL 5 MG/5ML IJ SOLN
INTRAMUSCULAR | Status: DC | PRN
  Administered 2022-09-12: 1 mg via INTRAVENOUS

## 2022-09-12 MED ORDER — DOCUSATE SODIUM 100 MG PO CAPS
100.0000 mg | ORAL_CAPSULE | Freq: Two times a day (BID) | ORAL | Status: DC
  Administered 2022-09-12 – 2022-09-13 (×2): 100 mg via ORAL
  Filled 2022-09-12 (×2): qty 1

## 2022-09-12 MED ORDER — POVIDONE-IODINE 10 % EX SWAB: 2.0000 | Freq: Once | CUTANEOUS | Status: DC

## 2022-09-12 MED ORDER — OXYCODONE HCL 5 MG PO TABS
5.0000 mg | ORAL_TABLET | Freq: Once | ORAL | Status: AC
  Administered 2022-09-12: 5 mg via ORAL

## 2022-09-12 MED ORDER — OXYCODONE HCL 5 MG PO TABS
ORAL_TABLET | ORAL | Status: AC
  Filled 2022-09-12: qty 1

## 2022-09-12 MED ORDER — PROPOFOL 1000 MG/100ML IV EMUL
INTRAVENOUS | Status: AC
  Filled 2022-09-12: qty 100

## 2022-09-12 MED ORDER — BUPIVACAINE-EPINEPHRINE 0.25% -1:200000 IJ SOLN
INTRAMUSCULAR | Status: DC | PRN
  Administered 2022-09-12: 30 mL

## 2022-09-12 MED ORDER — DOCUSATE SODIUM 100 MG PO CAPS: 100.0000 mg | ORAL_CAPSULE | Freq: Two times a day (BID) | ORAL | 0 refills | Status: DC

## 2022-09-12 MED ORDER — GLIMEPIRIDE 4 MG PO TABS
4.0000 mg | ORAL_TABLET | Freq: Every day | ORAL | Status: DC
  Administered 2022-09-13: 4 mg via ORAL
  Filled 2022-09-12: qty 1

## 2022-09-12 MED ORDER — LACTATED RINGERS IV BOLUS
500.0000 mL | Freq: Once | INTRAVENOUS | Status: AC
  Administered 2022-09-12: 500 mL via INTRAVENOUS

## 2022-09-12 MED ORDER — ACETAMINOPHEN 10 MG/ML IV SOLN
1000.0000 mg | Freq: Once | INTRAVENOUS | Status: DC | PRN
  Administered 2022-09-12: 1000 mg via INTRAVENOUS

## 2022-09-12 MED ORDER — SODIUM CHLORIDE 0.9 % IV SOLN: INTRAVENOUS | Status: DC

## 2022-09-12 MED ORDER — FENTANYL CITRATE PF 50 MCG/ML IJ SOSY
50.0000 ug | PREFILLED_SYRINGE | INTRAMUSCULAR | Status: AC | PRN
  Administered 2022-09-12 (×2): 50 ug via INTRAVENOUS

## 2022-09-12 MED ORDER — TIZANIDINE HCL 2 MG PO TABS: 2.0000 mg | ORAL_TABLET | Freq: Three times a day (TID) | ORAL | 0 refills | Status: AC | PRN

## 2022-09-12 MED ORDER — TRANEXAMIC ACID-NACL 1000-0.7 MG/100ML-% IV SOLN
1000.0000 mg | INTRAVENOUS | Status: AC
  Administered 2022-09-12: 1000 mg via INTRAVENOUS
  Filled 2022-09-12: qty 100

## 2022-09-12 MED ORDER — FENTANYL CITRATE (PF) 100 MCG/2ML IJ SOLN
INTRAMUSCULAR | Status: AC
  Filled 2022-09-12: qty 2

## 2022-09-12 MED ORDER — LISINOPRIL 20 MG PO TABS
20.0000 mg | ORAL_TABLET | Freq: Every day | ORAL | Status: DC
  Administered 2022-09-12 – 2022-09-13 (×2): 20 mg via ORAL
  Filled 2022-09-12 (×2): qty 1

## 2022-09-12 MED ORDER — METHOCARBAMOL 500 MG PO TABS
500.0000 mg | ORAL_TABLET | Freq: Four times a day (QID) | ORAL | Status: DC | PRN
  Administered 2022-09-12 – 2022-09-13 (×2): 500 mg via ORAL
  Filled 2022-09-12 (×2): qty 1

## 2022-09-12 MED ORDER — ASPIRIN 325 MG PO TBEC
325.0000 mg | DELAYED_RELEASE_TABLET | Freq: Two times a day (BID) | ORAL | Status: DC
  Administered 2022-09-12 – 2022-09-13 (×2): 325 mg via ORAL
  Filled 2022-09-12 (×2): qty 1

## 2022-09-12 MED ORDER — OXYCODONE HCL 5 MG/5ML PO SOLN: 5.0000 mg | Freq: Once | ORAL | Status: AC | PRN

## 2022-09-12 MED ORDER — ACETAMINOPHEN 10 MG/ML IV SOLN
INTRAVENOUS | Status: AC
  Filled 2022-09-12: qty 100

## 2022-09-12 MED ORDER — ONDANSETRON HCL 4 MG/2ML IJ SOLN: 4.0000 mg | Freq: Once | INTRAMUSCULAR | Status: DC | PRN

## 2022-09-12 MED ORDER — KETOROLAC TROMETHAMINE 30 MG/ML IJ SOLN
INTRAMUSCULAR | Status: AC
  Filled 2022-09-12: qty 1

## 2022-09-12 MED ORDER — INSULIN ASPART 100 UNIT/ML IJ SOLN
0.0000 [IU] | Freq: Three times a day (TID) | INTRAMUSCULAR | Status: DC
  Administered 2022-09-13: 3 [IU] via SUBCUTANEOUS

## 2022-09-12 MED ORDER — ONDANSETRON HCL 4 MG/2ML IJ SOLN
INTRAMUSCULAR | Status: AC
  Filled 2022-09-12: qty 2

## 2022-09-12 MED ORDER — DEXAMETHASONE SODIUM PHOSPHATE 10 MG/ML IJ SOLN
10.0000 mg | Freq: Once | INTRAMUSCULAR | Status: AC
  Administered 2022-09-12: 10 mg via INTRAVENOUS
  Filled 2022-09-12: qty 1

## 2022-09-12 MED ORDER — METFORMIN HCL 500 MG PO TABS
1000.0000 mg | ORAL_TABLET | Freq: Two times a day (BID) | ORAL | Status: DC
  Administered 2022-09-13: 1000 mg via ORAL
  Filled 2022-09-12: qty 2

## 2022-09-12 MED ORDER — METHOCARBAMOL 500 MG IVPB - SIMPLE MED
INTRAVENOUS | Status: AC
  Filled 2022-09-12: qty 55

## 2022-09-12 MED ORDER — DEXAMETHASONE SODIUM PHOSPHATE 10 MG/ML IJ SOLN
INTRAMUSCULAR | Status: AC
  Filled 2022-09-12: qty 1

## 2022-09-12 MED ORDER — STERILE WATER FOR IRRIGATION IR SOLN
Status: DC | PRN
Start: 2022-09-12 — End: 2022-09-12
  Administered 2022-09-12: 2000 mL

## 2022-09-12 MED ORDER — ORAL CARE MOUTH RINSE: 15.0000 mL | Freq: Once | OROMUCOSAL | Status: AC

## 2022-09-12 MED ORDER — PHENYLEPHRINE HCL-NACL 20-0.9 MG/250ML-% IV SOLN
INTRAVENOUS | Status: DC | PRN
  Administered 2022-09-12: 40 ug/min via INTRAVENOUS

## 2022-09-12 MED ORDER — ONDANSETRON HCL 4 MG/2ML IJ SOLN
INTRAMUSCULAR | Status: DC | PRN
  Administered 2022-09-12: 4 mg via INTRAVENOUS

## 2022-09-12 MED ORDER — ACETAMINOPHEN 325 MG PO TABS: 325.0000 mg | ORAL_TABLET | Freq: Four times a day (QID) | ORAL | Status: DC | PRN

## 2022-09-12 MED ORDER — KETOROLAC TROMETHAMINE 15 MG/ML IJ SOLN
15.0000 mg | Freq: Once | INTRAMUSCULAR | Status: AC
  Administered 2022-09-12: 15 mg via INTRAVENOUS
  Filled 2022-09-12: qty 1

## 2022-09-12 MED ORDER — ATENOLOL 50 MG PO TABS
100.0000 mg | ORAL_TABLET | Freq: Every day | ORAL | Status: DC
  Administered 2022-09-12 – 2022-09-13 (×2): 100 mg via ORAL
  Filled 2022-09-12 (×2): qty 2

## 2022-09-12 MED ORDER — ONDANSETRON HCL 4 MG PO TABS: 4.0000 mg | ORAL_TABLET | Freq: Four times a day (QID) | ORAL | Status: DC | PRN

## 2022-09-12 MED ORDER — TRANEXAMIC ACID-NACL 1000-0.7 MG/100ML-% IV SOLN
1000.0000 mg | Freq: Once | INTRAVENOUS | Status: AC
  Administered 2022-09-12: 1000 mg via INTRAVENOUS
  Filled 2022-09-12: qty 100

## 2022-09-12 MED ORDER — CEFAZOLIN SODIUM-DEXTROSE 2-3 GM-%(50ML) IV SOLR
INTRAVENOUS | Status: DC | PRN
  Administered 2022-09-12: 2 g via INTRAVENOUS

## 2022-09-12 MED ORDER — CEFAZOLIN SODIUM-DEXTROSE 2-4 GM/100ML-% IV SOLN
2.0000 g | Freq: Once | INTRAVENOUS | Status: AC
  Administered 2022-09-12: 2 g via INTRAVENOUS
  Filled 2022-09-12: qty 100

## 2022-09-12 MED ORDER — ONDANSETRON HCL 4 MG/2ML IJ SOLN: 4.0000 mg | Freq: Four times a day (QID) | INTRAMUSCULAR | Status: DC | PRN

## 2022-09-12 MED ORDER — CHLORHEXIDINE GLUCONATE 0.12 % MT SOLN
15.0000 mL | Freq: Once | OROMUCOSAL | Status: AC
  Administered 2022-09-12: 15 mL via OROMUCOSAL

## 2022-09-12 MED ORDER — FENTANYL CITRATE (PF) 100 MCG/2ML IJ SOLN
INTRAMUSCULAR | Status: DC | PRN
  Administered 2022-09-12: 50 ug via INTRATHECAL

## 2022-09-12 MED ORDER — PROPOFOL 500 MG/50ML IV EMUL
INTRAVENOUS | Status: DC | PRN
Start: 2022-09-12 — End: 2022-09-12
  Administered 2022-09-12: 75 ug/kg/min via INTRAVENOUS

## 2022-09-12 SURGICAL SUPPLY — 45 items
APL SKNCLS STERI-STRIP NONHPOA (GAUZE/BANDAGES/DRESSINGS) ×1
BAG COUNTER SPONGE SURGICOUNT (BAG) IMPLANT
BAG SPEC THK2 15X12 ZIP CLS (MISCELLANEOUS)
BAG SPNG CNTER NS LX DISP (BAG)
BAG ZIPLOCK 12X15 (MISCELLANEOUS) IMPLANT
BENZOIN TINCTURE PRP APPL 2/3 (GAUZE/BANDAGES/DRESSINGS) IMPLANT
BLADE SAW SGTL 18X1.27X75 (BLADE) ×1 IMPLANT
CLSR STERI-STRIP ANTIMIC 1/2X4 (GAUZE/BANDAGES/DRESSINGS) IMPLANT
COVER PERINEAL POST (MISCELLANEOUS) ×1 IMPLANT
COVER SURGICAL LIGHT HANDLE (MISCELLANEOUS) ×1 IMPLANT
CUP PINNACLE 100 SERIES 58MM (Hips) IMPLANT
DRAPE FOOT SWITCH (DRAPES) ×1 IMPLANT
DRAPE STERI IOBAN 125X83 (DRAPES) ×1 IMPLANT
DRAPE U-SHAPE 47X51 STRL (DRAPES) ×2 IMPLANT
DRSG AQUACEL AG ADV 3.5X 6 (GAUZE/BANDAGES/DRESSINGS) ×1 IMPLANT
DURAPREP 26ML APPLICATOR (WOUND CARE) ×1 IMPLANT
ELECT REM PT RETURN 15FT ADLT (MISCELLANEOUS) ×1 IMPLANT
ELIMINATOR HOLE APEX DEPUY (Hips) IMPLANT
GAUZE XEROFORM 1X8 LF (GAUZE/BANDAGES/DRESSINGS) IMPLANT
GLOVE BIOGEL PI IND STRL 8 (GLOVE) ×2 IMPLANT
GLOVE ECLIPSE 7.5 STRL STRAW (GLOVE) ×2 IMPLANT
GOWN STRL REUS W/ TWL XL LVL3 (GOWN DISPOSABLE) ×2 IMPLANT
GOWN STRL REUS W/TWL XL LVL3 (GOWN DISPOSABLE) ×2
HEAD CERAMIC 36 PLUS 8.5 12 14 (Hips) IMPLANT
HOLDER FOLEY CATH W/STRAP (MISCELLANEOUS) ×1 IMPLANT
HOOD PEEL AWAY T7 (MISCELLANEOUS) ×3 IMPLANT
KIT TURNOVER KIT A (KITS) IMPLANT
LINER NEUTRAL 52X36X58N (Liner) IMPLANT
NDL HYPO 22X1.5 SAFETY MO (MISCELLANEOUS) ×2 IMPLANT
NEEDLE HYPO 22X1.5 SAFETY MO (MISCELLANEOUS) ×2 IMPLANT
PACK ANTERIOR HIP CUSTOM (KITS) ×1 IMPLANT
SPIKE FLUID TRANSFER (MISCELLANEOUS) ×1 IMPLANT
STAPLER VISISTAT 35W (STAPLE) IMPLANT
STEM FEM ACTIS HIGH SZ7 (Stem) IMPLANT
STRIP CLOSURE SKIN 1/2X4 (GAUZE/BANDAGES/DRESSINGS) IMPLANT
SUT ETHIBOND NAB CT1 #1 30IN (SUTURE) ×2 IMPLANT
SUT MNCRL AB 3-0 PS2 18 (SUTURE) IMPLANT
SUT VIC AB 0 CT1 36 (SUTURE) ×1 IMPLANT
SUT VIC AB 1 CT1 36 (SUTURE) ×1 IMPLANT
SUT VIC AB 2-0 CT1 27 (SUTURE) ×1
SUT VIC AB 2-0 CT1 TAPERPNT 27 (SUTURE) ×1 IMPLANT
SUT VICRYL+ 3-0 36IN CT-1 (SUTURE) IMPLANT
TRAY CATH INTERMITTENT SS 16FR (CATHETERS) IMPLANT
TRAY FOLEY MTR SLVR 16FR STAT (SET/KITS/TRAYS/PACK) IMPLANT
TUBE SUCTION HIGH CAP CLEAR NV (SUCTIONS) ×1 IMPLANT

## 2022-09-12 NOTE — Evaluation (Signed)
Physical Therapy Evaluation Patient Details Name: Stephen Lawson MRN: [id number] DOB: 1950-02-02 Today's Date: 09/12/2022  History of Present Illness  72 yo male presents to therapy s/p R THA, anterior approach due to failure of conservative measures. Pt PMH includes but is not limited to: arthritis, back pain s/p back sx, DM II, HTN, hypothyroidism, and CTS s/p sx.  Clinical Impression     Stephen Lawson is a 72 y.o. male POD 0 s/p R THA. Patient reports mod I with SPC with mobility at baseline. Patient is now limited by functional impairments (see PT problem list below) and requires min A for bed mobility and CGA and cues for transfers. Patient was unable to safely ambulate with RW and able to side step ~ 2 feet to the R with RW and CGA level of assist. Patient instructed in exercise to facilitate ROM and circulation to manage edema in supine. Patient will benefit from continued skilled PT interventions to address impairments and progress towards PLOF. Acute PT will follow to progress mobility and stair training in preparation for safe discharge home with family support and OPPT services.       If plan is discharge home, recommend the following: A little help with walking and/or transfers;A little help with bathing/dressing/bathroom;Assistance with cooking/housework;Assist for transportation;Help with stairs or ramp for entrance   Can travel by private vehicle        Equipment Recommendations None recommended by PT  Recommendations for Other Services       Functional Status Assessment Patient has had a recent decline in their functional status and demonstrates the ability to make significant improvements in function in a reasonable and predictable amount of time.     Precautions / Restrictions Precautions Precautions: Fall Restrictions Weight Bearing Restrictions: No      Mobility  Bed Mobility Overal bed mobility: Needs Assistance Bed Mobility: Supine to Sit, Sit to Supine      Supine to sit: Contact guard, HOB elevated, Used rails Sit to supine: Min assist, Used rails   General bed mobility comments: increased time and cues for supine to sit, pt required min A for R LE for sit to supine and cues with increased time using bed rails to reposition in bed    Transfers Overall transfer level: Needs assistance Equipment used: Rolling walker (2 wheels) Transfers: Sit to/from Stand Sit to Stand: Min assist, From elevated surface           General transfer comment: pt required cues and min A for sit to stand    Ambulation/Gait               General Gait Details: pt limited by pain and able to side step to the R ~ 3 feet with RW and CGA with cues  Stairs            Wheelchair Mobility     Tilt Bed    Modified Rankin (Stroke Patients Only)       Balance Overall balance assessment: Needs assistance Sitting-balance support: Feet supported Sitting balance-Leahy Scale: Fair     Standing balance support: Bilateral upper extremity supported, During functional activity, Reliant on assistive device for balance Standing balance-Leahy Scale: Poor                               Pertinent Vitals/Pain Pain Assessment Pain Assessment: 0-10 Pain Score: 8  Pain Location: R hip Pain Descriptors / Indicators: Aching,  Constant, Discomfort, Grimacing, Operative site guarding Pain Intervention(s): Limited activity within patient's tolerance, Monitored during session, Premedicated before session, Repositioned, Ice applied    Home Living Family/patient expects to be discharged to:: Private residence Living Arrangements: Spouse/significant other;Children Available Help at Discharge: Family Type of Home: House Home Access: Stairs to enter Entrance Stairs-Rails: Right;Left;Can reach both Entrance Stairs-Number of Steps: 4   Home Layout: One level Home Equipment: Agricultural consultant (2 wheels);Cane - single point      Prior Function Prior  Level of Function : Independent/Modified Independent             Mobility Comments: mod I with SPC for all ADLs, self care tasks       Extremity/Trunk Assessment        Lower Extremity Assessment Lower Extremity Assessment: RLE deficits/detail RLE Deficits / Details: ankle DF 4-/5, PF 4/5 RLE Sensation: WNL    Cervical / Trunk Assessment Cervical / Trunk Assessment: Back Surgery  Communication   Communication Communication: No apparent difficulties;Hearing impairment  Cognition Arousal: Alert Behavior During Therapy: WFL for tasks assessed/performed Overall Cognitive Status: Within Functional Limits for tasks assessed                                          General Comments General comments (skin integrity, edema, etc.): pt O2 saturation fluccuated on RA from 87-93%    Exercises Total Joint Exercises Ankle Circles/Pumps: AROM, Both, 20 reps Quad Sets: AROM, Right, 5 reps Gluteal Sets: AROM, Both, 5 reps Heel Slides: AROM, Right, 5 reps   Assessment/Plan    PT Assessment Patient needs continued PT services  PT Problem List Decreased strength;Decreased range of motion;Decreased activity tolerance;Decreased balance;Decreased coordination;Decreased mobility;Pain       PT Treatment Interventions DME instruction;Gait training;Stair training;Functional mobility training;Therapeutic activities;Therapeutic exercise;Balance training;Neuromuscular re-education;Patient/family education;Modalities    PT Goals (Current goals can be found in the Care Plan section)  Acute Rehab PT Goals Patient Stated Goal: walk without pain and be able to squat PT Goal Formulation: With patient Time For Goal Achievement: 09/26/22 Potential to Achieve Goals: Good    Frequency 7X/week     Co-evaluation               AM-PAC PT "6 Clicks" Mobility  Outcome Measure Help needed turning from your back to your side while in a flat bed without using bedrails?: A  Little Help needed moving from lying on your back to sitting on the side of a flat bed without using bedrails?: A Little Help needed moving to and from a bed to a chair (including a wheelchair)?: A Little Help needed standing up from a chair using your arms (e.g., wheelchair or bedside chair)?: A Little Help needed to walk in hospital room?: Total Help needed climbing 3-5 steps with a railing? : Total 6 Click Score: 14    End of Session Equipment Utilized During Treatment: Gait belt Activity Tolerance: Patient limited by pain Patient left: in bed;with call bell/phone within reach Nurse Communication: Mobility status (pt is not currently safe for d/c home) PT Visit Diagnosis: Unsteadiness on feet (R26.81);Other abnormalities of gait and mobility (R26.89);Muscle weakness (generalized) (M62.81);Pain;Difficulty in walking, not elsewhere classified (R26.2) Pain - Right/Left: Right Pain - part of body: Hip;Knee    Time: 0865-7846 PT Time Calculation (min) (ACUTE ONLY): 28 min   Charges:   PT Evaluation $PT Eval Low Complexity: 1  Low PT Treatments $Therapeutic Activity: 8-22 mins PT General Charges $$ ACUTE PT VISIT: 1 Visit         Johnny Bridge, PT Acute Rehab   Stephen Lawson 09/12/2022, 5:52 PM

## 2022-09-12 NOTE — Transfer of Care (Signed)
Immediate Anesthesia Transfer of Care Note  Patient: Stephen Lawson  Procedure(s) Performed: TOTAL HIP ARTHROPLASTY ANTERIOR APPROACH (Right: Hip)  Patient Location: PACU  Anesthesia Type:MAC/ Spinal  Level of Consciousness: awake, alert , and oriented  Airway & Oxygen Therapy: Patient Spontanous Breathing and Patient connected to face mask oxygen  Post-op Assessment: Report given to RN and Post -op Vital signs reviewed and stable  Post vital signs: Reviewed and stable  Last Vitals:  Vitals Value Taken Time  BP 140/80 09/12/22 1208  Temp    Pulse 69 09/12/22 1211  Resp 13 09/12/22 1211  SpO2 100 % 09/12/22 1211  Vitals shown include unfiled device data.  Last Pain:  Vitals:   09/12/22 0753  TempSrc: Oral         Complications: No notable events documented.

## 2022-09-12 NOTE — Anesthesia Procedure Notes (Signed)
Spinal  Patient location during procedure: OR Start time: 09/12/2022 9:58 AM End time: 09/12/2022 10:06 AM Reason for block: surgical anesthesia Staffing Performed: anesthesiologist  Anesthesiologist: Eilene Ghazi, MD Performed by: Eilene Ghazi, MD Authorized by: Eilene Ghazi, MD   Preanesthetic Checklist Completed: patient identified, IV checked, site marked, risks and benefits discussed, surgical consent, monitors and equipment checked, pre-op evaluation and timeout performed Spinal Block Patient position: sitting Prep: Betadine Patient monitoring: heart rate, continuous pulse ox and blood pressure Approach: midline Location: L3-4 Injection technique: single-shot Needle Needle type: Sprotte  Needle gauge: 24 G Needle length: 9 cm Assessment Sensory level: T6 Events: CSF return Additional Notes

## 2022-09-12 NOTE — Interval H&P Note (Signed)
History and Physical Interval Note:  09/12/2022 9:08 AM  Stephen Lawson  has presented today for surgery, with the diagnosis of RIGHT HIP OSTEOARTHRITIS.  The various methods of treatment have been discussed with the patient and family. After consideration of risks, benefits and other options for treatment, the patient has consented to  Procedure(s): TOTAL HIP ARTHROPLASTY ANTERIOR APPROACH (Right) as a surgical intervention.  The patient's history has been reviewed, patient examined, no change in status, stable for surgery.  I have reviewed the patient's chart and labs.  Questions were answered to the patient's satisfaction.     Harvie Junior

## 2022-09-12 NOTE — Anesthesia Postprocedure Evaluation (Signed)
Anesthesia Post Note  Patient: Stephen Lawson  Procedure(s) Performed: TOTAL HIP ARTHROPLASTY ANTERIOR APPROACH (Right: Hip)     Patient location during evaluation: PACU Anesthesia Type: Spinal Level of consciousness: oriented and awake and alert Pain management: pain level controlled Vital Signs Assessment: post-procedure vital signs reviewed and stable Respiratory status: spontaneous breathing, respiratory function stable and patient connected to nasal cannula oxygen Cardiovascular status: blood pressure returned to baseline and stable Postop Assessment: no headache, no backache and no apparent nausea or vomiting Anesthetic complications: no  No notable events documented.  Last Vitals:  Vitals:   09/12/22 1428 09/12/22 1429  BP:    Pulse: 79 82  Resp:    Temp:    SpO2: (!) 80% 97%    Last Pain:  Vitals:   09/12/22 1429  TempSrc:   PainSc: 10-Worst pain ever    LLE Motor Response: Purposeful movement (09/12/22 1415) LLE Sensation: Full sensation (09/12/22 1415) RLE Motor Response: No movement due to regional block (09/12/22 1415) RLE Sensation: Full sensation (09/12/22 1415) L Sensory Level: S1-Sole of foot, small toes (09/12/22 1415) R Sensory Level: S1-Sole of foot, small toes (09/12/22 1415)  Camrie Stock S

## 2022-09-12 NOTE — Op Note (Signed)
PATIENT ID:      Stephen Lawson  MRN:     [id number] DOB/AGE:    [birth year] / 72 y.o.       OPERATIVE REPORT    DATE OF PROCEDURE:  09/12/2022       PREOPERATIVE DIAGNOSIS:  RIGHT HIP OSTEOARTHRITIS                                                       Estimated body mass index is 32.92 kg/m as calculated from the following:   Height as of this encounter: 5\' 11"  (1.803 m).   Weight as of this encounter: 107 kg.     POSTOPERATIVE DIAGNOSIS:  RIGHT HIP OSTEOARTHRITIS                                                           PROCEDURE:  1. right total hip arthroplasty using a 58 mm DePuy Pinnacle  Cup, [employer],  neutral liner, a +8.5 36 mm ceramic head,  and a #7 high offset Actis stem, 2.interpretation of multiple intraoperative fluoroscopic images   SURGEON: Harvie Junior    ASSISTANT:   Gus Puma PA-C  (present throughout entire procedure and necessary for timely completion of the procedure)  ANESTHESIA: spinal  BLOOD LOSS: 350cc Tranexamic Acid: 1 gram IV DRAINS: None COMPLICATIONS: None    NDICATIONS FOR PROCEDURE:Patient with end-stage arthritis of the right hip.  X-rays show bone-on-bone arthritic changes. Despite conservative measures with observation, anti-inflammatory medicine, narcotics, use of a cane, has severe unremitting pain and can ambulate only less than 1 block before resting.  Patient desires elective right total hip arthroplasty to decrease pain and increase function. The risks, benefits, and alternatives were discussed at length including but not limited to the risks of infection, bleeding, nerve injury, stiffness, blood clots, the need for revision surgery, cardiopulmonary complications, among others, and they were willing to proceed.Benefits have been discussed. Questions answered.     PROCEDURE IN DETAIL: The patient was identified by armband,  received preoperative IV antibiotics in the holding area at [facility], taken to the  operating room , appropriate anesthetic monitors  were attached and spinal anesthesia was induced.  The patient was placed onto the hot bed and all bony prominences were well-padded.The right hip was prepped and draped for an anterior approach to the hip.  An incision was made and the subcutaneous dissection was down to the level of the tensor fascia.  The fascia was opened and finger dissected.  The bleeders coming across the anterior portion of the hip were identified and cauterized. Retractors were put in place above and below the femoral neck.  The capsule was opened and tagged and a provisional neck cut was made.  The head was removed and sized on the back table.  The acetabulum was sequentially reamed to a level of 57 mm and a 58mm porous-coated pinnacle cup was hammered into place with 45 of lateral opening and 30 of anteversion.fluoroscopy was used to ensure this position of the cup.  Attention was turned towards the femur where the leg was actually rotated, extended,  and adduction did.  The femur was sequentially broached until a size of 7 high offset broach gave a perfect fit and fill.at this point a  8.5 mm delta ceramic hip ball was placed and the hip reduced.  Fluoroscopic images were taken to assess the leg length, fit and fill of the stem, and cup position.  We were happy with the construct at this point.  The 7 broach was removed and a final Actis stem with standard offset  and a 8.5 mm ceramic hip ball was placed and reduced.  Final images were taken to make certain there were happy with the position at this point.   The capsule was closed with #1 Vicryl suture.  The tensor fascia was closed with 0 Vicryl suture.  The skin was then closed with combination of 0 and 2-0 Vicryl suture.  The top layer was with 3-0 Monocryl suture.  Benzoin and Steri-Strips were applied  and a sterile compressive dressing was applied and the patient taken to recovery room she noted be in satisfactory condition.   Past medical Motion for the procedure was approximately 300 cc.  Of note Gus Puma Pac was with the entire case and assisted by retraction of tissues, manipulation of the leg, and closing the minimize or time.   Harvie Junior 09/12/2022, 11:55 AM

## 2022-09-12 NOTE — Anesthesia Procedure Notes (Addendum)
Procedure Name: MAC Date/Time: 09/12/2022 10:07 AM  Performed by: Cleda Clarks, CRNAPre-anesthesia Checklist: Patient identified, Emergency Drugs available, Suction available, Patient being monitored and Timeout performed Patient Re-evaluated:Patient Re-evaluated prior to induction Oxygen Delivery Method: Simple face mask Ventilation: Oral airway inserted - appropriate to patient size Dental Injury: Teeth and Oropharynx as per pre-operative assessment

## 2022-09-12 NOTE — Discharge Instructions (Signed)

## 2022-09-12 NOTE — Anesthesia Preprocedure Evaluation (Signed)
Anesthesia Evaluation  Patient identified by MRN, date of birth, ID band Patient awake    Reviewed: Allergy & Precautions, H&P , NPO status , Patient's Chart, lab work & pertinent test results  Airway Mallampati: II  TM Distance: >3 FB Neck ROM: Full    Dental no notable dental hx.    Pulmonary neg pulmonary ROS, former smoker   Pulmonary exam normal breath sounds clear to auscultation       Cardiovascular hypertension, Normal cardiovascular exam Rhythm:Regular Rate:Normal     Neuro/Psych Chronic pain on methadone  negative psych ROS   GI/Hepatic negative GI ROS, Neg liver ROS,,,  Endo/Other  diabetes, Type 2Hypothyroidism    Renal/GU negative Renal ROS  negative genitourinary   Musculoskeletal negative musculoskeletal ROS (+)    Abdominal   Peds negative pediatric ROS (+)  Hematology negative hematology ROS (+)   Anesthesia Other Findings   Reproductive/Obstetrics negative OB ROS                             Anesthesia Physical Anesthesia Plan  ASA: 3  Anesthesia Plan: Spinal   Post-op Pain Management: Regional block*   Induction: Intravenous  PONV Risk Score and Plan: 1 and Ondansetron, Propofol infusion and Treatment may vary due to age or medical condition  Airway Management Planned: Simple Face Mask  Additional Equipment:   Intra-op Plan:   Post-operative Plan:   Informed Consent: I have reviewed the patients History and Physical, chart, labs and discussed the procedure including the risks, benefits and alternatives for the proposed anesthesia with the patient or authorized representative who has indicated his/her understanding and acceptance.     Dental advisory given  Plan Discussed with: CRNA and Surgeon  Anesthesia Plan Comments:        Anesthesia Quick Evaluation

## 2022-09-13 ENCOUNTER — Encounter (HOSPITAL_COMMUNITY): Payer: Self-pay | Admitting: Orthopedic Surgery

## 2022-09-13 DIAGNOSIS — Z87891 Personal history of nicotine dependence: Secondary | ICD-10-CM | POA: Diagnosis not present

## 2022-09-13 DIAGNOSIS — E039 Hypothyroidism, unspecified: Secondary | ICD-10-CM | POA: Diagnosis not present

## 2022-09-13 DIAGNOSIS — I10 Essential (primary) hypertension: Secondary | ICD-10-CM | POA: Diagnosis not present

## 2022-09-13 DIAGNOSIS — M1611 Unilateral primary osteoarthritis, right hip: Secondary | ICD-10-CM | POA: Diagnosis not present

## 2022-09-13 DIAGNOSIS — Z85828 Personal history of other malignant neoplasm of skin: Secondary | ICD-10-CM | POA: Diagnosis not present

## 2022-09-13 DIAGNOSIS — E119 Type 2 diabetes mellitus without complications: Secondary | ICD-10-CM | POA: Diagnosis not present

## 2022-09-13 LAB — GLUCOSE, CAPILLARY: Glucose-Capillary: 172 mg/dL — ABNORMAL HIGH (ref 70–99)

## 2022-09-13 NOTE — TOC Transition Note (Signed)
Transition of Care [facility]) - CM/SW Discharge Note   Patient Details  Name: Stephen Lawson MRN: [id number] Date of Birth: 10/31/1950  [facility] CM/SW Contact:  Amada Jupiter, LCSW Phone Number: 09/13/2022, 9:52 AM   Clinical Narrative:     Met with pt who confirms he has needed DME in the home.  OPPT already arranged with [facility]).  No TOC needs.  Final next level of care: OP Rehab Barriers to Discharge: No Barriers Identified   Patient Goals and CMS Choice      Discharge Placement                         Discharge Plan and Services Additional resources added to the After Visit Summary for                  DME Arranged: N/A DME Agency: NA                  Social Determinants of Health (SDOH) Interventions SDOH Screenings   Food Insecurity: No Food Insecurity (09/12/2022)  Housing: Low Risk  (09/12/2022)  Transportation Needs: No Transportation Needs (09/12/2022)  Utilities: Not At Risk (09/12/2022)  Alcohol Screen: Low Risk  (01/07/2021)  Depression (PHQ2-9): Low Risk  (07/08/2022)  Financial Resource Strain: Low Risk  (01/07/2021)  Physical Activity: Unknown (04/04/2022)  Social Connections: Unknown (06/09/2022)   [facility] from [facility]  Stress: No Stress Concern Present (04/04/2022)  Tobacco Use: High Risk (09/12/2022)     Readmission Risk Interventions     No data to display

## 2022-09-13 NOTE — Progress Notes (Signed)
Physical Therapy Treatment Patient Details Name: Stephen Lawson MRN: [id number] DOB: 1950-02-01 Today's Date: 09/13/2022   History of Present Illness 72 yo male presents to therapy s/p R THA on 09/13/22, anterior approach due to failure of conservative measures. Pt PMH includes but is not limited to: arthritis, back pain s/p back sx, DM II, HTN, hypothyroidism, and CTS s/p sx.    PT Comments  Pt is POD # 1 and is progressing well. His pain has improved and he was able to ambulate 59' (then 40') and performed stairs similar to home set up.  Pt with good understanding of transfer technique and safety.  He reports he has outpt PT scheduled for next week.  Pt provided with HEP to perform 3 x day as able until f/u with outpt PT. Pt demonstrates safe gait & transfers in order to return home from PT perspective once discharged by MD.  While in hospital, will continue to benefit from PT for skilled therapy to advance mobility and exercises.       If plan is discharge home, recommend the following: A little help with walking and/or transfers;A little help with bathing/dressing/bathroom;Assistance with cooking/housework;Assist for transportation;Help with stairs or ramp for entrance   Can travel by private vehicle        Equipment Recommendations  None recommended by PT    Recommendations for Other Services       Precautions / Restrictions Precautions Precautions: Fall Restrictions Weight Bearing Restrictions: No RLE Weight Bearing: Weight bearing as tolerated     Mobility  Bed Mobility Overal bed mobility: Needs Assistance Bed Mobility: Supine to Sit     Supine to sit: Supervision     General bed mobility comments: Pt self assisting R LE with gait belt; increased time but no assist needed    Transfers Overall transfer level: Needs assistance Equipment used: Rolling walker (2 wheels) Transfers: Sit to/from Stand Sit to Stand: Contact guard assist, Supervision           General  transfer comment: Performed x 2 progressing from CGA to supervision.  Pt with good hand placement.  Min cues for R LE management with return to sitting    Ambulation/Gait Ambulation/Gait assistance: Contact guard assist, Supervision Gait Distance (Feet): 70 Feet (70' then 40') Assistive device: Rolling walker (2 wheels) Gait Pattern/deviations: Step-through pattern, Decreased stride length, Decreased weight shift to right Gait velocity: decreased     General Gait Details: Started CGA progressing to supervision.  Min cues for posture, good RW proxmity, steady with RW.   Stairs Stairs: Yes Stairs assistance: Contact guard assist Stair Management: Two rails, Step to pattern, Forwards Number of Stairs: 4 General stair comments: Performed with CGA, pt familiar with correct sequencing   Wheelchair Mobility     Tilt Bed    Modified Rankin (Stroke Patients Only)       Balance Overall balance assessment: Needs assistance Sitting-balance support: No upper extremity supported Sitting balance-Leahy Scale: Good       Standing balance-Leahy Scale: Fair Standing balance comment: Could static stand without AD; Steady with RW to ambulate                            Cognition Arousal: Alert Behavior During Therapy: WFL for tasks assessed/performed Overall Cognitive Status: Within Functional Limits for tasks assessed  Exercises Total Joint Exercises Ankle Circles/Pumps: AROM, Both, 10 reps, Supine Quad Sets: AROM, Both, 10 reps, Supine Heel Slides: AAROM, Right, 5 reps, Supine Hip ABduction/ADduction: AAROM, Right, 5 reps, Supine Long Arc Quad: AROM, Right, 5 reps, Seated Other Exercises Other Exercises: Cued to tolerance only and for AAROM techniques with gait belt    General Comments General comments (skin integrity, edema, etc.): Tried RA with sats 90% activity and 95% rest Educated on safe ice use, no  pivots, car transfers,TED hose during day. Also, encouraged walking every 1-2 hours during day. Educated on HEP with focus on mobility the first weeks. Discussed doing exercises within pain control and if pain increasing could decreased ROM, reps, and stop exercises as needed. Encouraged to perform  ankle pumps frequently for blood flow .      Pertinent Vitals/Pain Pain Assessment Pain Assessment: 0-10 Pain Score:  (7/10 pre, 8/10 walk, 4/10 post in chair) Pain Location: R hip Pain Descriptors / Indicators: Aching, Discomfort, Sore Pain Intervention(s): Limited activity within patient's tolerance, Patient requesting pain meds-RN notified, Monitored during session, Premedicated before session, Repositioned, Ice applied    Home Living                          Prior Function            PT Goals (current goals can now be found in the care plan section) Progress towards PT goals: Progressing toward goals    Frequency    7X/week      PT Plan      Co-evaluation              AM-PAC PT "6 Clicks" Mobility   Outcome Measure  Help needed turning from your back to your side while in a flat bed without using bedrails?: A Little Help needed moving from lying on your back to sitting on the side of a flat bed without using bedrails?: A Little Help needed moving to and from a bed to a chair (including a wheelchair)?: A Little Help needed standing up from a chair using your arms (e.g., wheelchair or bedside chair)?: A Little Help needed to walk in hospital room?: A Little Help needed climbing 3-5 steps with a railing? : A Little 6 Click Score: 18    End of Session Equipment Utilized During Treatment: Gait belt Activity Tolerance: Patient tolerated treatment well Patient left: with call bell/phone within reach;with chair alarm set;in chair;with family/visitor present Nurse Communication: Mobility status PT Visit Diagnosis: Unsteadiness on feet (R26.81);Other  abnormalities of gait and mobility (R26.89);Muscle weakness (generalized) (M62.81);Pain;Difficulty in walking, not elsewhere classified (R26.2) Pain - Right/Left: Right Pain - part of body: Hip;Knee     Time: 4098-1191 PT Time Calculation (min) (ACUTE ONLY): 40 min  Charges:    $Gait Training: 8-22 mins $Therapeutic Exercise: 8-22 mins $Therapeutic Activity: 8-22 mins PT General Charges $$ ACUTE PT VISIT: 1 Visit                     Anise Salvo, PT Acute Rehab [facility] [phone]    Rayetta Humphrey 09/13/2022, 12:11 PM

## 2022-09-13 NOTE — Plan of Care (Signed)
  Problem: Activity: Goal: Ability to avoid complications of mobility impairment will improve Outcome: Progressing Goal: Ability to tolerate increased activity will improve Outcome: Progressing   Problem: Pain Management: Goal: Pain level will decrease with appropriate interventions Outcome: Progressing   Problem: Safety: Goal: Ability to remain free from injury will improve Outcome: Progressing

## 2022-09-13 NOTE — Progress Notes (Signed)
Subjective: 1 Day Post-Op Procedure(s) (LRB): TOTAL HIP ARTHROPLASTY ANTERIOR APPROACH (Right) Patient reports pain as moderate.  Taking by mouth and voiding okay.  Objective: Vital signs in last 24 hours: Temp:  [97.6 F (36.4 C)-98.5 F (36.9 C)] 98.5 F (36.9 C) (09/10 0624) Pulse Rate:  [65-82] 66 (09/10 0624) Resp:  [10-18] 15 (09/10 0624) BP: (130-168)/(60-91) 149/73 (09/10 0624) SpO2:  [80 %-100 %] 98 % (09/10 0624)  Intake/Output from previous day: 09/09 0701 - 09/10 0700 In: 2044.8 [P.O.:360; I.V.:1684.8] Out: 1450 [Urine:1300; Blood:150] Intake/Output this shift: No intake/output data recorded.  No results for input(s): "HGB" in the last 72 hours. No results for input(s): "WBC", "RBC", "HCT", "PLT" in the last 72 hours. No results for input(s): "NA", "K", "CL", "CO2", "BUN", "CREATININE", "GLUCOSE", "CALCIUM" in the last 72 hours. No results for input(s): "LABPT", "INR" in the last 72 hours. Right hip exam: Neurovascular intact Sensation intact distally Intact pulses distally Dorsiflexion/Plantar flexion intact Incision: dressing C/D/I No cellulitis present Compartment soft   Assessment/Plan: 1 Day Post-Op Procedure(s) (LRB): TOTAL HIP ARTHROPLASTY ANTERIOR APPROACH (Right) Plan: Up with therapy weight-bear as tolerated on the right. Aspirin 325 mg twice daily for DVT prophylaxis. Discharge home if he passes therapy today. Follow-up with Dr. Luiz Blare in 2 weeks. His medications have been sent to the pharmacy.    Patient's anticipated LOS is less than 2 midnights, meeting these requirements: - Lives within 1 hour of care - Has a competent adult at home to recover with post-op recover - NO history of   - Diabetes  - Coronary Artery Disease  - Heart failure  - Heart attack  - Stroke  - DVT/VTE  - Cardiac arrhythmia  - Respiratory Failure/COPD  - Renal failure  - Anemia  - Advanced Liver disease     Stephen Lawson 09/13/2022, 8:29 AM

## 2022-09-13 NOTE — Discharge Summary (Signed)
Patient ID: Stephen Lawson MRN: [id number] DOB/AGE: 1950/12/27 72 y.o.  Admit date: 09/12/2022 Discharge date: 09/13/2022  Admission Diagnoses:  Principal Problem:   Primary osteoarthritis of right hip Active Problems:   S/P total hip arthroplasty   Discharge Diagnoses:  Same  Past Medical History:  Diagnosis Date   Arthritis    Back pain    Cancer (HCC)    skin cancer removed from the face   Diabetes mellitus without complication (HCC)    Hypertension    Hypothyroidism    Migraine     Surgeries: Procedure(s): Right TOTAL HIP ARTHROPLASTY ANTERIOR APPROACH on 09/12/2022   Consultants:   Discharged Condition: Improved  Hospital Course: MARIEO FIPPS is an 72 y.o. male who was admitted 09/12/2022 for operative treatment ofPrimary osteoarthritis of right hip. Patient has severe unremitting pain that affects sleep, daily activities, and work/hobbies. After pre-op clearance the patient was taken to the operating room on 09/12/2022 and underwent  Procedure(s): Right TOTAL HIP ARTHROPLASTY ANTERIOR APPROACH.    Patient was given perioperative antibiotics:  Anti-infectives (From admission, onward)    Start     Dose/Rate Route Frequency Ordered Stop   09/12/22 1600  ceFAZolin (ANCEF) IVPB 2g/100 mL premix        2 g 200 mL/hr over 30 Minutes Intravenous  Once 09/12/22 1226 09/12/22 2051   09/12/22 0730  ceFAZolin (ANCEF) IVPB 2g/100 mL premix  Status:  Discontinued        2 g 200 mL/hr over 30 Minutes Intravenous On call to O.R. 09/12/22 0716 09/12/22 1705        Patient was given sequential compression devices, early ambulation, and chemoprophylaxis to prevent DVT.  Patient benefited maximally from hospital stay and there were no complications.    Recent vital signs: Patient Vitals for the past 24 hrs:  BP Temp Temp src Pulse Resp SpO2  09/13/22 0624 (!) 149/73 98.5 F (36.9 C) -- 66 15 98 %  09/13/22 0203 136/60 98.1 F (36.7 C) Oral 66 15 92 %  09/12/22 2210 130/66 98.2 F  (36.8 C) Oral 68 15 97 %  09/12/22 1956 132/68 98.5 F (36.9 C) -- 69 16 96 %  09/12/22 1737 (!) 163/80 98.3 F (36.8 C) Oral 74 18 96 %  09/12/22 1600 (!) 146/68 -- -- 77 -- 98 %  09/12/22 1500 (!) 146/72 -- -- 67 -- 97 %  09/12/22 1445 -- -- -- -- -- 96 %  09/12/22 1430 -- -- -- -- -- 98 %  09/12/22 1429 -- -- -- 82 -- 97 %  09/12/22 1428 -- -- -- 79 -- (!) 80 %  09/12/22 1415 (!) 159/78 -- -- 72 11 (!) 89 %  09/12/22 1400 (!) 153/78 97.9 F (36.6 C) -- 65 10 93 %  09/12/22 1345 (!) 156/85 -- -- 71 11 98 %  09/12/22 1330 (!) 168/75 -- -- 73 13 99 %  09/12/22 1315 (!) 159/91 -- -- 73 13 92 %  09/12/22 1300 (!) 154/82 -- -- 69 10 94 %  09/12/22 1245 (!) 148/69 -- -- 74 14 90 %  09/12/22 1230 136/75 -- -- 69 14 94 %  09/12/22 1215 (!) 150/81 -- -- 73 17 100 %  09/12/22 1208 (!) 140/80 97.6 F (36.4 C) -- 70 11 100 %     Recent laboratory studies: No results for input(s): "WBC", "HGB", "HCT", "PLT", "NA", "K", "CL", "CO2", "BUN", "CREATININE", "GLUCOSE", "INR", "CALCIUM" in the last 72 hours.  Invalid  input(s): "PT", "2"   Discharge Medications:   Allergies as of 09/13/2022       Reactions   Darvocet [propoxyphene N-acetaminophen] Itching      Talwin [pentazocine] Itching   Ultram [tramadol]         Medication List     STOP taking these medications    clobetasol cream 0.05 % Commonly known as: TEMOVATE       TAKE these medications    amLODipine 10 MG tablet Commonly known as: NORVASC Take 1 tablet (10 mg total) by mouth daily.   aspirin EC 325 MG tablet Take 1 tablet (325 mg total) by mouth 2 (two) times daily after a meal. Take x 1 month post op to decrease risk of blood clots.   atenolol 100 MG tablet Commonly known as: TENORMIN Take 1 tablet (100 mg total) by mouth daily.   blood glucose meter kit and supplies Kit Dispense based on patient and insurance preference. Use up to four times daily as directed. (FOR ICD-9 250.00, 250.01).   celecoxib 200  MG capsule Commonly known as: CeleBREX Take 1 capsule (200 mg total) by mouth 2 (two) times daily.   docusate sodium 100 MG capsule Commonly known as: Colace Take 1 capsule (100 mg total) by mouth 2 (two) times daily.   glimepiride 4 MG tablet Commonly known as: AMARYL Take 1 tablet (4 mg total) by mouth daily before breakfast.   hydrocortisone 2.5 % cream Apply 1 Application topically 2 (two) times daily as needed (dermatitis).   levothyroxine 112 MCG tablet Commonly known as: SYNTHROID Take 1 tablet (112 mcg total) by mouth daily.   lisinopril 20 MG tablet Commonly known as: ZESTRIL Take 1 tablet (20 mg total) by mouth daily.   metFORMIN 500 MG tablet Commonly known as: GLUCOPHAGE Take 2 tablets (1,000 mg total) by mouth 2 (two) times daily with a meal.   methadone 10 MG tablet Commonly known as: DOLOPHINE Take 10 mg by mouth 2 (two) times daily.   oxyCODONE-acetaminophen 5-325 MG tablet Commonly known as: PERCOCET/ROXICET Take 1-2 tablets by mouth every 6 (six) hours as needed for severe pain.   tiZANidine 2 MG tablet Commonly known as: ZANAFLEX Take 1 tablet (2 mg total) by mouth every 8 (eight) hours as needed for muscle spasms.               Discharge Care Instructions  (From admission, onward)           Start     Ordered   09/13/22 0000  Weight bearing as tolerated       Question Answer Comment  Laterality right   Extremity Lower      09/13/22 0832   09/12/22 0000  Weight bearing as tolerated       Question Answer Comment  Laterality right   Extremity Lower      09/12/22 1224            Diagnostic Studies: DG HIP UNILAT WITH PELVIS 1V RIGHT  Result Date: 09/12/2022 CLINICAL DATA:  Total right hip arthroplasty elective surgery. Intraoperative fluoroscopy. EXAM: DG HIP (WITH OR WITHOUT PELVIS) 1V RIGHT COMPARISON:  Pelvis and bilateral hip radiographs 07/12/2016 FINDINGS: Images were performed intraoperatively without the presence of a  radiologist. The patient is undergoing total right hip arthroplasty. No hardware complication is seen. Total fluoroscopy images: 3 Total fluoroscopy time: 30 seconds Total dose: Radiation Exposure Index (as provided by the fluoroscopic device): 7.5 mGy air Kerma Please see intraoperative findings  for further detail. IMPRESSION: Intraoperative fluoroscopy for total right hip arthroplasty. Electronically Signed   By: Neita Garnet M.D.   On: 09/12/2022 12:55   DG C-Arm 1-60 Min-No Report  Result Date: 09/12/2022 Fluoroscopy was utilized by the requesting physician.  No radiographic interpretation.    Disposition: Discharge disposition: 01-Home or Self Care       Discharge Instructions     Call MD / Call 911   Complete by: As directed    If you experience chest pain or shortness of breath, CALL 911 and be transported to the hospital emergency room.  If you develope a fever above 101 F, pus (white drainage) or increased drainage or redness at the wound, or calf pain, call your surgeon's office.   Call MD / Call 911   Complete by: As directed    If you experience chest pain or shortness of breath, CALL 911 and be transported to the hospital emergency room.  If you develope a fever above 101 F, pus (white drainage) or increased drainage or redness at the wound, or calf pain, call your surgeon's office.   Diet Carb Modified   Complete by: As directed    Diet Carb Modified   Complete by: As directed    Increase activity slowly as tolerated   Complete by: As directed    Increase activity slowly as tolerated   Complete by: As directed    Post-operative opioid taper instructions:   Complete by: As directed    POST-OPERATIVE OPIOID TAPER INSTRUCTIONS: It is important to wean off of your opioid medication as soon as possible. If you do not need pain medication after your surgery it is ok to stop day one. Opioids include: Codeine, Hydrocodone(Norco, Vicodin), Oxycodone(Percocet, oxycontin) and  hydromorphone amongst others.  Long term and even short term use of opiods can cause: Increased pain response Dependence Constipation Depression Respiratory depression And more.  Withdrawal symptoms can include Flu like symptoms Nausea, vomiting And more Techniques to manage these symptoms Hydrate well Eat regular healthy meals Stay active Use relaxation techniques(deep breathing, meditating, yoga) Do Not substitute Alcohol to help with tapering If you have been on opioids for less than two weeks and do not have pain than it is ok to stop all together.  Plan to wean off of opioids This plan should start within one week post op of your joint replacement. Maintain the same interval or time between taking each dose and first decrease the dose.  Cut the total daily intake of opioids by one tablet each day Next start to increase the time between doses. The last dose that should be eliminated is the evening dose.      Post-operative opioid taper instructions:   Complete by: As directed    POST-OPERATIVE OPIOID TAPER INSTRUCTIONS: It is important to wean off of your opioid medication as soon as possible. If you do not need pain medication after your surgery it is ok to stop day one. Opioids include: Codeine, Hydrocodone(Norco, Vicodin), Oxycodone(Percocet, oxycontin) and hydromorphone amongst others.  Long term and even short term use of opiods can cause: Increased pain response Dependence Constipation Depression Respiratory depression And more.  Withdrawal symptoms can include Flu like symptoms Nausea, vomiting And more Techniques to manage these symptoms Hydrate well Eat regular healthy meals Stay active Use relaxation techniques(deep breathing, meditating, yoga) Do Not substitute Alcohol to help with tapering If you have been on opioids for less than two weeks and do not have pain than it  is ok to stop all together.  Plan to wean off of opioids This plan should start  within one week post op of your joint replacement. Maintain the same interval or time between taking each dose and first decrease the dose.  Cut the total daily intake of opioids by one tablet each day Next start to increase the time between doses. The last dose that should be eliminated is the evening dose.      TED hose   Complete by: As directed    Use stockings (TED hose) for 2 weeks on both leg(s).  You may remove them at night for sleeping.   Weight bearing as tolerated   Complete by: As directed    Laterality: right   Extremity: Lower   Weight bearing as tolerated   Complete by: As directed    Laterality: right   Extremity: Lower        Follow-up Information     Jodi Geralds, MD. Go on 09/27/2022.   Specialty: Orthopedic Surgery Why: YOur appointment is scheduled for 9:15. Contact information: [address] [place] [address] [phone]         [facility] [place] Follow up.   Contact information: [phone]                 Signed: Matthew Folks 09/13/2022, 8:32 AM

## 2022-09-13 NOTE — Progress Notes (Signed)
Discharge package printed and instructions given to pt and son. No questions asked.

## 2022-09-15 ENCOUNTER — Ambulatory Visit: Payer: Medicare Other | Admitting: Physical Therapy

## 2022-09-16 IMAGING — CT CT ABD-PELV W/O CM
2 of 4 series · 15 of 46 positions shown, 17 images · non-contrast
Comparison: 06/08/2010

CLINICAL DATA: Flank pain



[Series 2: axial st · axial · 0.89mm/px · z∈[-729,-279]mm · 12 of 108 slices shown, 14 images]
[im 9/108  soft-tissue]
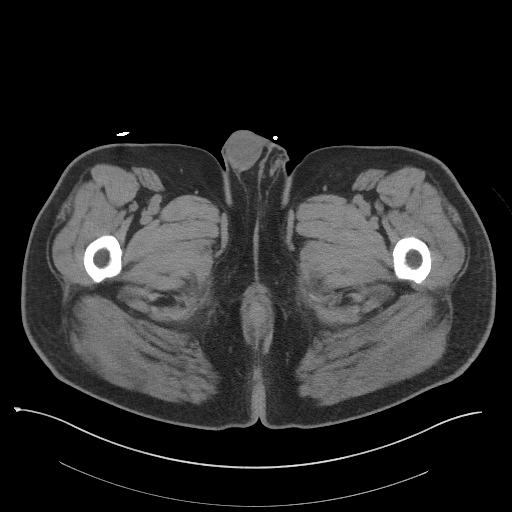
[im 9/108  bone]
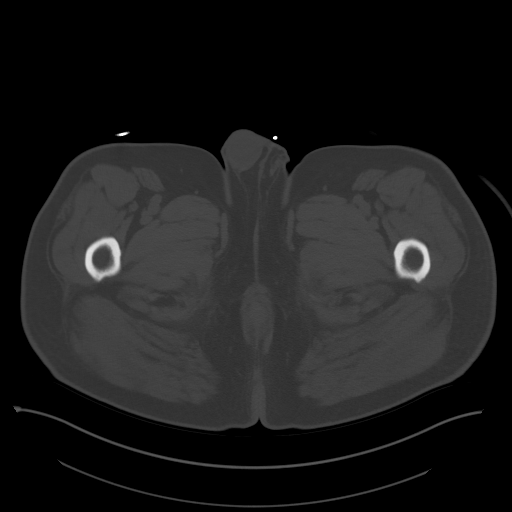
[im 17/108  soft-tissue]
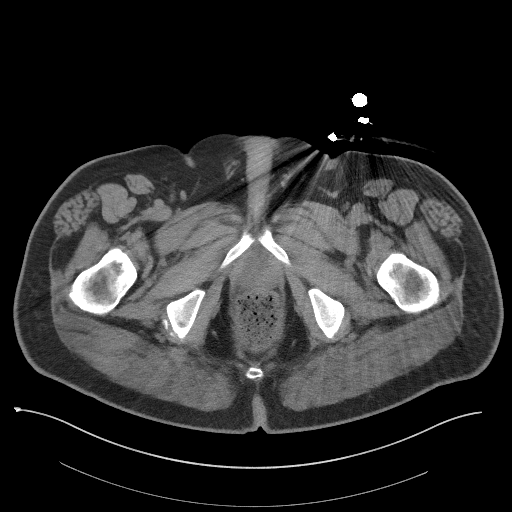
[im 25/108  soft-tissue]
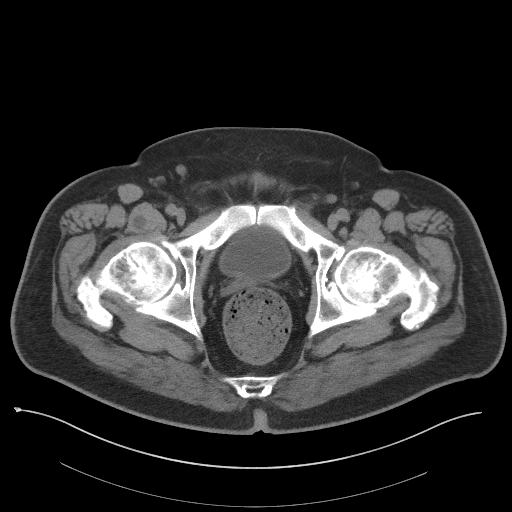
[im 33/108  soft-tissue]
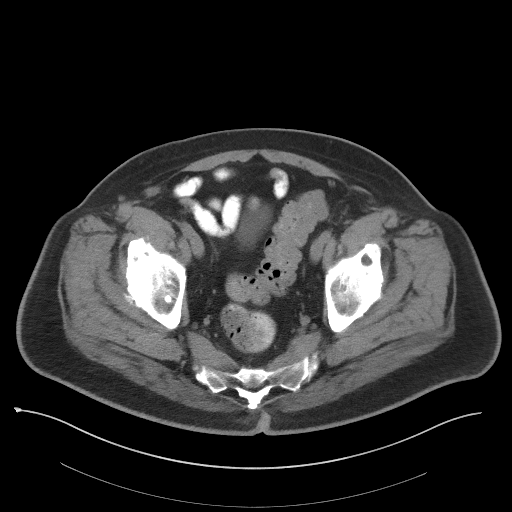
[im 42/108  soft-tissue]
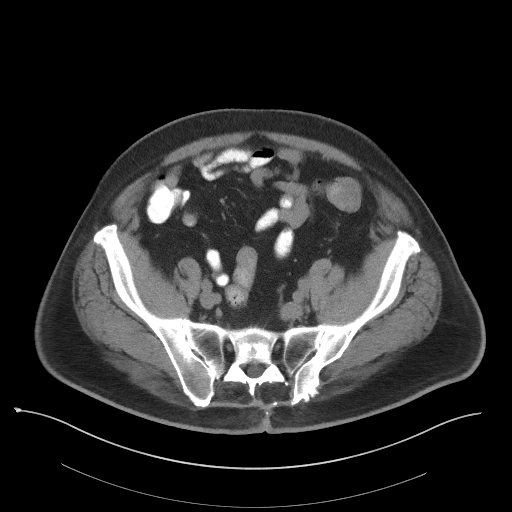
[im 50/108  soft-tissue]
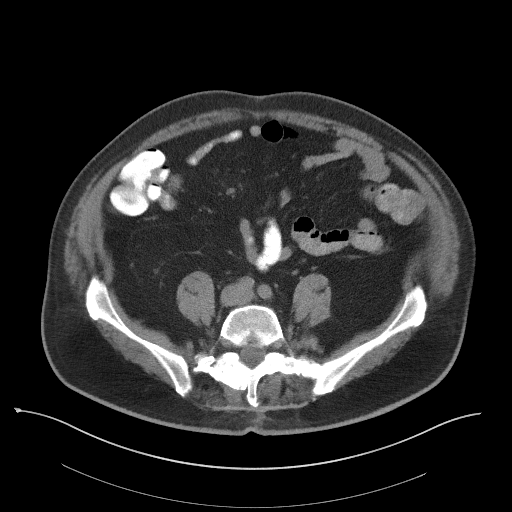
[im 58/108  soft-tissue]
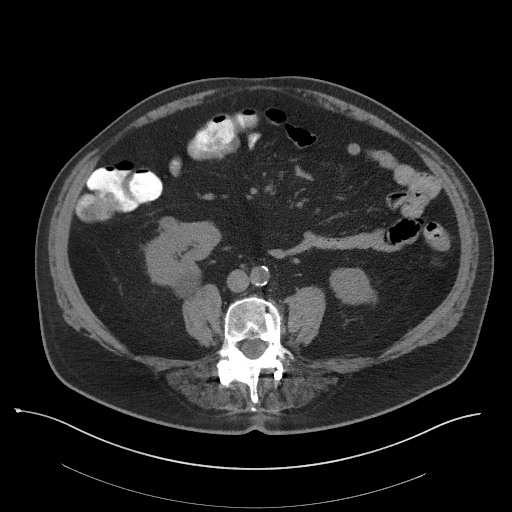
[im 66/108  soft-tissue]
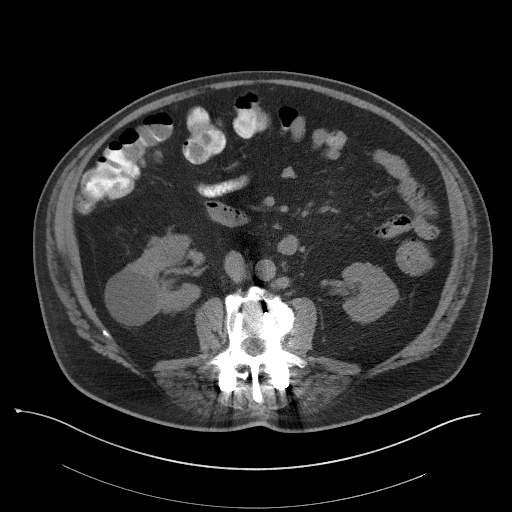
[im 75/108  soft-tissue]
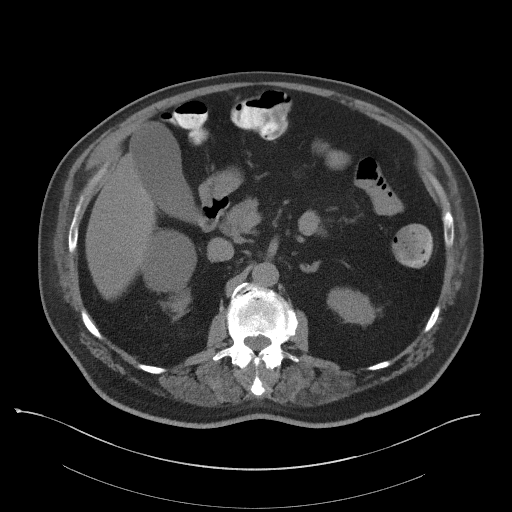
[im 75/108  bone]
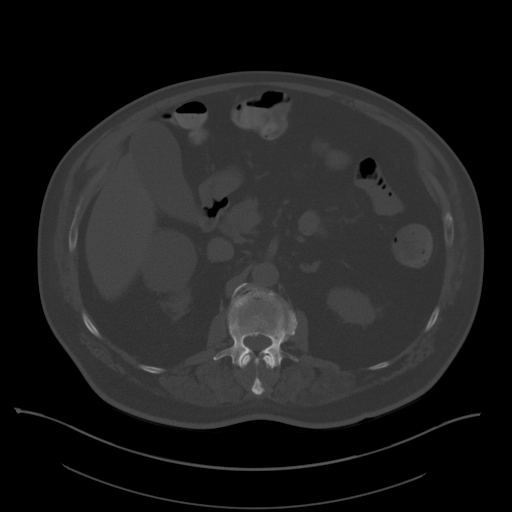
[im 83/108  soft-tissue]
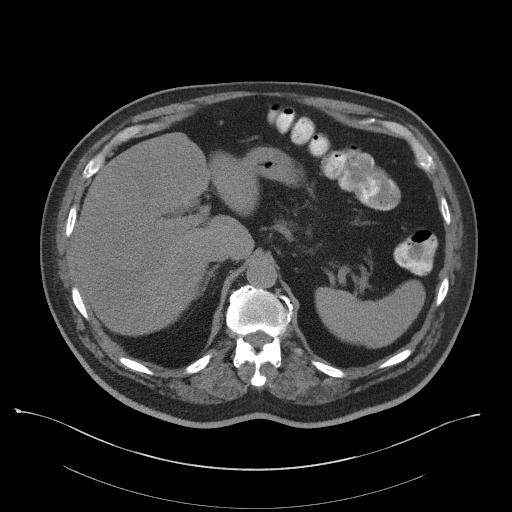
[im 91/108  soft-tissue]
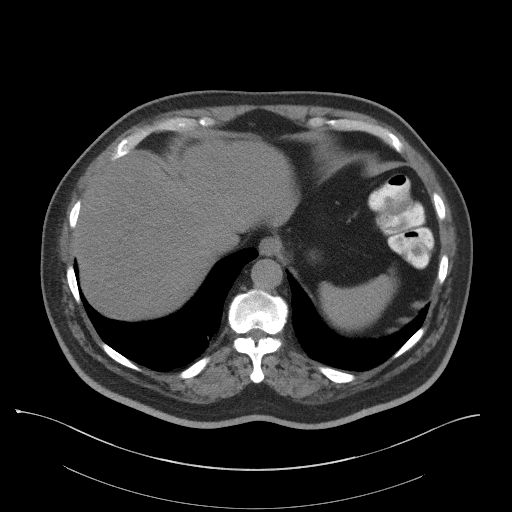
[im 99/108  soft-tissue]
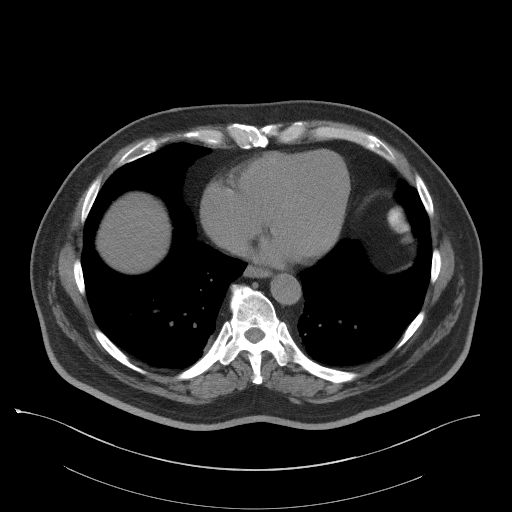

[Series 5: coronal st · coronal · 0.87mm/px · 3 of 115 slices shown]
[im 39/115  soft-tissue]
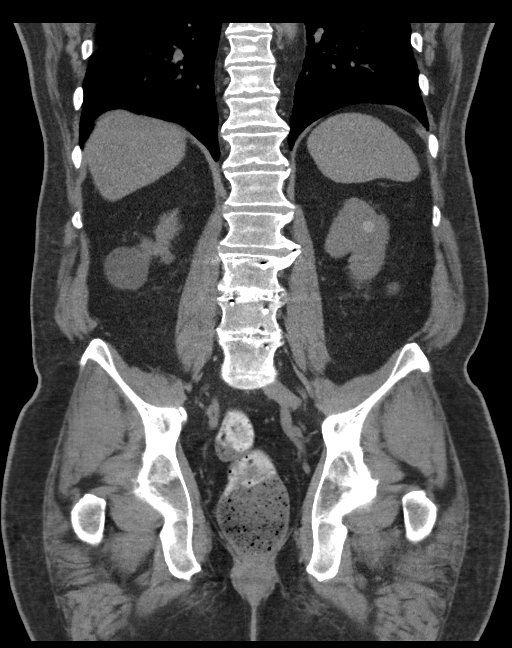
[im 51/115  soft-tissue]
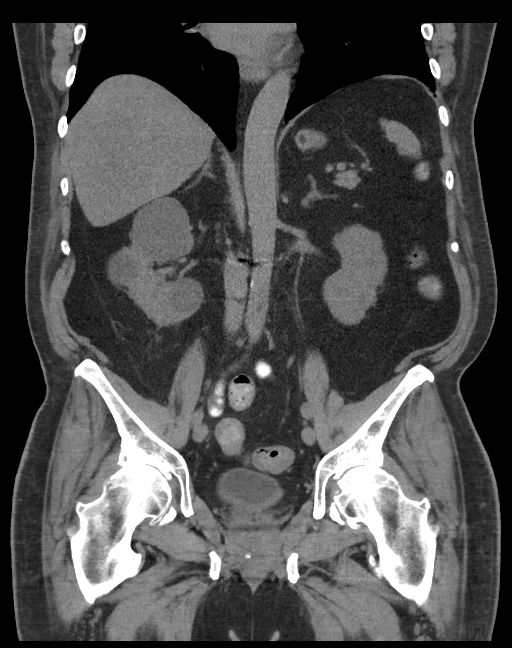
[im 64/115  soft-tissue]
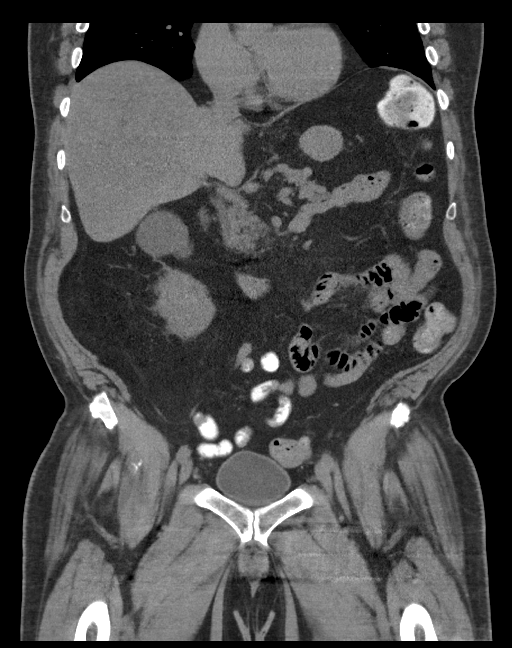

[15 of 46 positions shown; findings below may reference images not displayed]

FINDINGS: Lower chest: Unremarkable.

Hepatobiliary: There are few subcentimeter low-density foci in the
right lobe of liver which are too small to be optimally
characterized. There is no dilation of bile ducts. Gallbladder is
distended. There is no wall thickening in gallbladder.

Pancreas: No focal abnormality is seen.

Spleen: Unremarkable.

Adrenals/Urinary Tract: Adrenals are unremarkable. There is no
hydronephrosis. There are no renal or ureteral stones. There are
multiple smooth marginated low-density lesions in both kidneys
largest measuring 6.5 cm in size in the upper pole of right kidney.
Suggesting renal cysts. There is 11 mm smooth marginated lesion with
high density measurements in the upper pole of left kidney. Urinary
bladder is unremarkable.

Stomach/Bowel: Stomach is not distended. Small bowel loops are not
dilated. Appendix is difficult to visualize. There is a small
caliber tubular structure adjacent to the tip of cecum seen in the
coronal reconstruction images, possibly normal appendix. There is no
pericecal inflammation. There is no significant wall thickening in
the colon. Few diverticula are seen in colon without signs of focal
acute diverticulitis.

Vascular/Lymphatic: Scattered arterial calcifications are seen.

Reproductive: Prostate appears smaller than usual, possibly
suggesting previous intervention.

Other: There is no ascites or pneumoperitoneum. Small bilateral
inguinal hernias containing fat are noted.

Musculoskeletal: Significant degenerative changes are noted in both
hips. Degenerative changes are noted in the lumbar spine.
IMPRESSION: There is no evidence of intestinal obstruction or pneumoperitoneum.
There is no hydronephrosis.

There are few subcentimeter low-density foci in the right lobe of
liver, possibly cysts or hemangiomas. The lesions are not optimally
characterized in this noncontrast study. Bilateral renal cysts.
There is 11 mm lesion in the upper pole of left kidney with density
measurements higher than usual for simple cyst, possibly suggesting
hemorrhagic cyst. Follow-up renal sonogram may be considered.

Few diverticula are seen in the colon without signs of focal
diverticulitis. Marked degenerative changes are noted in both hips.
Lumbar spondylosis.

Other findings as described in the body of the report.

## 2022-09-20 ENCOUNTER — Other Ambulatory Visit: Payer: Self-pay

## 2022-09-20 ENCOUNTER — Encounter: Payer: Self-pay | Admitting: Physical Therapy

## 2022-09-20 ENCOUNTER — Ambulatory Visit: Payer: Medicare Other | Attending: Orthopedic Surgery | Admitting: Physical Therapy

## 2022-09-20 DIAGNOSIS — M25651 Stiffness of right hip, not elsewhere classified: Secondary | ICD-10-CM | POA: Insufficient documentation

## 2022-09-20 DIAGNOSIS — M25551 Pain in right hip: Secondary | ICD-10-CM | POA: Insufficient documentation

## 2022-09-20 NOTE — Therapy (Signed)
OUTPATIENT PHYSICAL THERAPY LOWER EXTREMITY EVALUATION   Patient Name: Stephen Lawson MRN: [id number] DOB:Dec 16, 1950, 72 y.o., male Today's Date: 09/20/2022  END OF SESSION:  PT End of Session - 09/20/22 1205     Visit Number 1    Number of Visits 12    Date for PT Re-Evaluation 12/19/22    Authorization Type FOTO    PT Start Time 1111    PT Stop Time 1144    PT Time Calculation (min) 33 min    Activity Tolerance Patient tolerated treatment well    Behavior During Therapy WFL for tasks assessed/performed             Past Medical History:  Diagnosis Date   Arthritis    Back pain    Cancer (HCC)    skin cancer removed from the face   Diabetes mellitus without complication (HCC)    Hypertension    Hypothyroidism    Migraine    Past Surgical History:  Procedure Laterality Date   ARTHROSCOPY WITH ANTERIOR CRUCIATE LIGAMENT (ACL) REPAIR WITH ANTERIOR TIBILIAS GRAFT Right    BACK SURGERY     x 4   ELBOW SURGERY Bilateral    SHOULDER SURGERY Right    arthroscopy   TESTICLE SURGERY     TOTAL HIP ARTHROPLASTY Right 09/12/2022   Procedure: TOTAL HIP ARTHROPLASTY ANTERIOR APPROACH;  Surgeon: Jodi Geralds, MD;  Location: [facility];  Service: Orthopedics;  Laterality: Right;   WRIST SURGERY Right    carpal tunnel   Patient Active Problem List   Diagnosis Date Noted   Primary osteoarthritis of right hip 09/12/2022   S/P total hip arthroplasty 09/12/2022   Diabetes mellitus treated with oral medication (HCC) 07/08/2022   Hearing loss of both ears due to cerumen impaction 05/11/2017   Migraine headache 11/22/2016   Hypothyroid 11/22/2016   T2DM (type 2 diabetes mellitus) (HCC) 10/02/2015   Hypertension associated with diabetes (HCC) 10/02/2015   Chronic back pain 06/29/2015   Allergic rhinitis 02/25/2013   Dysphagia, unspecified(787.20) 01/22/2013   Fatty liver 12/06/2012   H/O asbestos exposure 12/06/2012   Edema 12/06/2012    REFERRING PROVIDER: Jodi Geralds  MD  REFERRING DIAG: S/p anterior total hip replacement.  THERAPY DIAG:  Pain in right hip  Stiffness of right hip, not elsewhere classified  Rationale for Evaluation and Treatment: Rehabilitation  ONSET DATE: Ongoing, 09/12/22 (surgery date).  SUBJECTIVE:   SUBJECTIVE STATEMENT: The patient presents to the clinic s/p right anterior total hip replacement performed on 09/12/22.  His pain is rated at an 8/10 today.  Increased activity increases his pain.  Rest and pain medication decreases his pain.  He describes the pain as throbbing.  He walked in the clinic today with a straight can and prefers to use it on his surgical side.    PERTINENT HISTORY: Please see above. PAIN:  Are you having pain? Yes: NPRS scale: 8/10 Pain location: Right hip. Pain description: Throbbing. Aggravating factors: As above. Relieving factors: As above.  PRECAUTIONS: Anterior hip.  No ultrasound.  WEIGHT BEARING RESTRICTIONS: No  FALLS:  Has patient fallen in last 6 months? No  LIVING ENVIRONMENT: Lives with: lives with their spouse Lives in: House/apartment Stairs:  6 stairs into home with railing.  Patient performs in a non-reciprocating fashion. Has following equipment at home: Single point cane  OCCUPATION: Retired.  PLOF: Independent with basic ADLs and Independent with household mobility with device  PATIENT GOALS: Do more without right hip pain.  OBJECTIVE:   PATIENT SURVEYS:  FOTO 23.  PALPATION: C/o diffuse right hip pain currently.  LOWER EXTREMITY ROM:  In supine:  Right hip flexion (assisted) to 65 degrees.  He demonstrates -10 degrees of right knee extension via a short arc quad.  LOWER EXTREMITY MMT:  Not accurately assessed due to high pain-level.  However, he was easily able to perform a right SAQ.  GAIT: Slow and cautious gait pattern with a straight cane on right with a decrease in step length and a tendency to external rotate.  OBSERVATION:    Aquacel intact  over patient's right anterior thigh.  TRANSITIONS:  Sit to stand with armrest and sit to supine to sit with supervision only.  PATIENT EDUCATION:  Education details: Discussed his current HEP that he was instructed in the hospital. Person educated: Patient Education method: Explanation Education comprehension: verbalized understanding  HOME EXERCISE PROGRAM:   ASSESSMENT:  CLINICAL IMPRESSION: The patient presents to [facility] s/p right anterior total hip replacement performed on 09/12/22.  He remains in a lot of pain at this time.  He is walking safely with a straight cane and prefers to use it on the surgical side and has a tendency to externally rotate..  In supine he achieved right hip flexion with assist to 65 degrees.  He is abel to perform a right short arc quad.  Transfers/transitory movements were performed with supervision only.  His Aquacel is intact over his right anterior hip region.  Patient will benefit from skilled physical therapy intervention to address pain and deficits.  OBJECTIVE IMPAIRMENTS: Abnormal gait, decreased activity tolerance, decreased mobility, difficulty walking, decreased ROM, decreased strength, and pain.   ACTIVITY LIMITATIONS: carrying, lifting, bending, standing, and locomotion level  PARTICIPATION LIMITATIONS: meal prep, cleaning, and laundry  PERSONAL FACTORS: Time since onset of injury/illness/exacerbation are also affecting patient's functional outcome.   REHAB POTENTIAL: Good  CLINICAL DECISION MAKING: Stable/uncomplicated  EVALUATION COMPLEXITY: Low   GOALS:  SHORT TERM GOALS: Target date: 10/04/22  Ind with an initial HEP. Goal status: INITIAL   LONG TERM GOALS: Target date: 12/19/22.  Ind with an advanced HEP.  Goal status: INITIAL  2.  Perform a reciprocating stair pain with one railing.  Goal status: INITIAL  3.  Increase right hip strength to a solid 4+/5 to increase stability for functional tasks.  Goal status:  INITIAL  4.  Patient walk a community distance with right hip pain not > 3/10.  Goal status: INITIAL  5.  Perform ADL's with right hip pain not > 3-4/10.  Goal status: INITIAL  PLAN:  PT FREQUENCY: 2x/week  PT DURATION: 6 weeks  PLANNED INTERVENTIONS: Therapeutic exercises, Therapeutic activity, Neuromuscular re-education, Gait training, Patient/Family education, Self Care, Stair training, Electrical stimulation, Cryotherapy, Moist heat, and Manual therapy  PLAN FOR NEXT SESSION: Nustep, Gait training, progress per anterior total hip replacement protocol per patient tolerance.   Betania Dizon, [place], PT 09/20/2022, 12:38 PM

## 2022-09-23 ENCOUNTER — Ambulatory Visit: Payer: Medicare Other | Admitting: *Deleted

## 2022-09-26 DIAGNOSIS — M545 Low back pain, unspecified: Secondary | ICD-10-CM | POA: Diagnosis not present

## 2022-09-27 DIAGNOSIS — M1611 Unilateral primary osteoarthritis, right hip: Secondary | ICD-10-CM | POA: Diagnosis not present

## 2022-09-28 ENCOUNTER — Ambulatory Visit: Payer: Medicare Other

## 2022-09-28 DIAGNOSIS — M25551 Pain in right hip: Secondary | ICD-10-CM

## 2022-09-28 DIAGNOSIS — M25651 Stiffness of right hip, not elsewhere classified: Secondary | ICD-10-CM | POA: Diagnosis not present

## 2022-09-28 NOTE — Therapy (Signed)
OUTPATIENT PHYSICAL THERAPY LOWER EXTREMITY TREATMENT   Patient Name: Stephen Lawson MRN: [id number] DOB:[birth year], 71 y.o., male Today's Date: 09/28/2022  END OF SESSION:  PT End of Session - 09/28/22 1302     Visit Number 2    Number of Visits 12    Date for PT Re-Evaluation 12/19/22    Authorization Type FOTO    PT Start Time 1300    PT Stop Time 1345    PT Time Calculation (min) 45 min    Activity Tolerance Patient tolerated treatment well    Behavior During Therapy WFL for tasks assessed/performed             Past Medical History:  Diagnosis Date   Arthritis    Back pain    Cancer (HCC)    skin cancer removed from the face   Diabetes mellitus without complication (HCC)    Hypertension    Hypothyroidism    Migraine    Past Surgical History:  Procedure Laterality Date   ARTHROSCOPY WITH ANTERIOR CRUCIATE LIGAMENT (ACL) REPAIR WITH ANTERIOR TIBILIAS GRAFT Right    BACK SURGERY     x 4   ELBOW SURGERY Bilateral    SHOULDER SURGERY Right    arthroscopy   TESTICLE SURGERY     TOTAL HIP ARTHROPLASTY Right 09/12/2022   Procedure: TOTAL HIP ARTHROPLASTY ANTERIOR APPROACH;  Surgeon: Jodi Geralds, MD;  Location: [facility];  Service: Orthopedics;  Laterality: Right;   WRIST SURGERY Right    carpal tunnel   Patient Active Problem List   Diagnosis Date Noted   Primary osteoarthritis of right hip 09/12/2022   S/P total hip arthroplasty 09/12/2022   Diabetes mellitus treated with oral medication (HCC) 07/08/2022   Hearing loss of both ears due to cerumen impaction 05/11/2017   Migraine headache 11/22/2016   Hypothyroid 11/22/2016   T2DM (type 2 diabetes mellitus) (HCC) 10/02/2015   Hypertension associated with diabetes (HCC) 10/02/2015   Chronic back pain 06/29/2015   Allergic rhinitis 02/25/2013   Dysphagia, unspecified(787.20) 01/22/2013   Fatty liver 12/06/2012   H/O asbestos exposure 12/06/2012   Edema 12/06/2012    REFERRING PROVIDER: Jodi Geralds  MD  REFERRING DIAG: S/p anterior total hip replacement.  THERAPY DIAG:  Pain in right hip  Stiffness of right hip, not elsewhere classified  Rationale for Evaluation and Treatment: Rehabilitation  ONSET DATE: Ongoing, 09/12/22 (surgery date).  SUBJECTIVE:   SUBJECTIVE STATEMENT: Pt reports 2/10 right hip pain today.  PERTINENT HISTORY: Please see above. PAIN:  Are you having pain? Yes: NPRS scale: 2/10 Pain location: Right hip. Pain description: Throbbing. Aggravating factors: As above. Relieving factors: As above.  PRECAUTIONS: Anterior hip.  No ultrasound.  WEIGHT BEARING RESTRICTIONS: No  FALLS:  Has patient fallen in last 6 months? No  LIVING ENVIRONMENT: Lives with: lives with their spouse Lives in: House/apartment Stairs:  6 stairs into home with railing.  Patient performs in a non-reciprocating fashion. Has following equipment at home: Single point cane  OCCUPATION: Retired.  PLOF: Independent with basic ADLs and Independent with household mobility with device  PATIENT GOALS: Do more without right hip pain.   OBJECTIVE:   PATIENT SURVEYS:  FOTO 23.  PALPATION: C/o diffuse right hip pain currently.  LOWER EXTREMITY ROM:  In supine:  Right hip flexion (assisted) to 65 degrees.  He demonstrates -10 degrees of right knee extension via a short arc quad.  LOWER EXTREMITY MMT:  Not accurately assessed due to high pain-level.  However, he  was easily able to perform a right SAQ.  GAIT: Slow and cautious gait pattern with a straight cane on right with a decrease in step length and a tendency to external rotate.  OBSERVATION:    Aquacel intact over patient's right anterior thigh.  TRANSITIONS:  Sit to stand with armrest and sit to supine to sit with supervision only.  TODAY'S TREATMENT SESSION:                                   EXERCISE LOG  Exercise Repetitions and Resistance Comments  Nustep  Lvl 3 x 15 mins seat    Rockerboard 3 mins    Heel/Toe Raises 3 mins   Forward Step Ups 6" box x 20 reps bil   Lateral Step Ups    LAQs 3# x 2 mins   Seated Marches 3# x 2 mins   Seated Hip Abduction Red tband x 3 mins   Seated Hip Adduction 2 mins   Seated Ham Curls Green x 20 reps bil    Blank cell = exercise not performed today  PATIENT EDUCATION:  Education details: Discussed his current HEP that he was instructed in the hospital. Person educated: Patient Education method: Explanation Education comprehension: verbalized understanding  HOME EXERCISE PROGRAM:   ASSESSMENT:  CLINICAL IMPRESSION: Pt arrives for today's treatment session 2/10 right hip pain.  PT able to tolerate Nustep today for warm up without discomfort.  Pt introduced to several standing and seated exercises to increase strength, function, and ROM.  Pt requiring min cues for proper technique and posture.  Pt denied any pain at completion of today's treatment session.  OBJECTIVE IMPAIRMENTS: Abnormal gait, decreased activity tolerance, decreased mobility, difficulty walking, decreased ROM, decreased strength, and pain.   ACTIVITY LIMITATIONS: carrying, lifting, bending, standing, and locomotion level  PARTICIPATION LIMITATIONS: meal prep, cleaning, and laundry  PERSONAL FACTORS: Time since onset of injury/illness/exacerbation are also affecting patient's functional outcome.   REHAB POTENTIAL: Good  CLINICAL DECISION MAKING: Stable/uncomplicated  EVALUATION COMPLEXITY: Low   GOALS:  SHORT TERM GOALS: Target date: 10/04/22  Ind with an initial HEP. Goal status: INITIAL   LONG TERM GOALS: Target date: 12/19/22.  Ind with an advanced HEP.  Goal status: INITIAL  2.  Perform a reciprocating stair pain with one railing.  Goal status: INITIAL  3.  Increase right hip strength to a solid 4+/5 to increase stability for functional tasks.  Goal status: INITIAL  4.  Patient walk a community distance with right hip pain not > 3/10.  Goal status:  INITIAL  5.  Perform ADL's with right hip pain not > 3-4/10.  Goal status: INITIAL  PLAN:  PT FREQUENCY: 2x/week  PT DURATION: 6 weeks  PLANNED INTERVENTIONS: Therapeutic exercises, Therapeutic activity, Neuromuscular re-education, Gait training, Patient/Family education, Self Care, Stair training, Electrical stimulation, Cryotherapy, Moist heat, and Manual therapy  PLAN FOR NEXT SESSION: Nustep, Gait training, progress per anterior total hip replacement protocol per patient tolerance.   Newman Pies, PTA 09/28/2022, 2:36 PM

## 2022-09-30 ENCOUNTER — Encounter: Payer: Self-pay | Admitting: *Deleted

## 2022-09-30 ENCOUNTER — Ambulatory Visit: Payer: Medicare Other | Admitting: *Deleted

## 2022-09-30 DIAGNOSIS — M25651 Stiffness of right hip, not elsewhere classified: Secondary | ICD-10-CM | POA: Diagnosis not present

## 2022-09-30 DIAGNOSIS — M25551 Pain in right hip: Secondary | ICD-10-CM

## 2022-09-30 NOTE — Therapy (Signed)
OUTPATIENT PHYSICAL THERAPY LOWER EXTREMITY TREATMENT   Patient Name: Stephen Lawson MRN: [id number] DOB:[birth year], 72 y.o., male Today's Date: 09/30/2022  END OF SESSION:  PT End of Session - 09/30/22 1201     Visit Number 3    Number of Visits 12    Date for PT Re-Evaluation 12/19/22    Authorization Type FOTO    PT Start Time 1145    PT Stop Time 1232    PT Time Calculation (min) 47 min             Past Medical History:  Diagnosis Date   Arthritis    Back pain    Cancer (HCC)    skin cancer removed from the face   Diabetes mellitus without complication (HCC)    Hypertension    Hypothyroidism    Migraine    Past Surgical History:  Procedure Laterality Date   ARTHROSCOPY WITH ANTERIOR CRUCIATE LIGAMENT (ACL) REPAIR WITH ANTERIOR TIBILIAS GRAFT Right    BACK SURGERY     x 4   ELBOW SURGERY Bilateral    SHOULDER SURGERY Right    arthroscopy   TESTICLE SURGERY     TOTAL HIP ARTHROPLASTY Right 09/12/2022   Procedure: TOTAL HIP ARTHROPLASTY ANTERIOR APPROACH;  Surgeon: Jodi Geralds, MD;  Location: [facility];  Service: Orthopedics;  Laterality: Right;   WRIST SURGERY Right    carpal tunnel   Patient Active Problem List   Diagnosis Date Noted   Primary osteoarthritis of right hip 09/12/2022   S/P total hip arthroplasty 09/12/2022   Diabetes mellitus treated with oral medication (HCC) 07/08/2022   Hearing loss of both ears due to cerumen impaction 05/11/2017   Migraine headache 11/22/2016   Hypothyroid 11/22/2016   T2DM (type 2 diabetes mellitus) (HCC) 10/02/2015   Hypertension associated with diabetes (HCC) 10/02/2015   Chronic back pain 06/29/2015   Allergic rhinitis 02/25/2013   Dysphagia, unspecified(787.20) 01/22/2013   Fatty liver 12/06/2012   H/O asbestos exposure 12/06/2012   Edema 12/06/2012    REFERRING PROVIDER: Jodi Geralds MD  REFERRING DIAG: S/p anterior total hip replacement.  THERAPY DIAG:  Pain in right hip  Stiffness of right hip, not  elsewhere classified  Rationale for Evaluation and Treatment: Rehabilitation  ONSET DATE: Ongoing, 09/12/22 (surgery date).  SUBJECTIVE:   SUBJECTIVE STATEMENT: Pt reports 2/10 right hip pain today.  PERTINENT HISTORY: Please see above. PAIN:  Are you having pain? Yes: NPRS scale: 2/10 Pain location: Right hip. Pain description: Throbbing. Aggravating factors: As above. Relieving factors: As above.  PRECAUTIONS: Anterior hip.  No ultrasound.  WEIGHT BEARING RESTRICTIONS: No  FALLS:  Has patient fallen in last 6 months? No  LIVING ENVIRONMENT: Lives with: lives with their spouse Lives in: House/apartment Stairs:  6 stairs into home with railing.  Patient performs in a non-reciprocating fashion. Has following equipment at home: Single point cane  OCCUPATION: Retired.  PLOF: Independent with basic ADLs and Independent with household mobility with device  PATIENT GOALS: Do more without right hip pain.   OBJECTIVE:   PATIENT SURVEYS:  FOTO 23.  PALPATION: C/o diffuse right hip pain currently.  LOWER EXTREMITY ROM:  In supine:  Right hip flexion (assisted) to 65 degrees.  He demonstrates -10 degrees of right knee extension via a short arc quad.  LOWER EXTREMITY MMT:  Not accurately assessed due to high pain-level.  However, he was easily able to perform a right SAQ.  GAIT: Slow and cautious gait pattern with a straight cane  on right with a decrease in step length and a tendency to external rotate.  OBSERVATION:    Aquacel intact over patient's right anterior thigh.  TRANSITIONS:  Sit to stand with armrest and sit to supine to sit with supervision only.  TODAY'S TREATMENT SESSION:                                           09-30-22                      RT ATHR   No SLR                                   EXERCISE LOG  Exercise Repetitions and Resistance Comments  Nustep  Lvl 3 x 15 mins seat    Rockerboard 3 mins   Heel/Toe Raises 2x15-20 reps   Forward  Step Ups 8" box x 10 reps bil   Lateral Step Ups    LAQs 3# x 3x10 pause 5 secxs at top   Seated Marches    Seated Hip Abduction Green tband x 30   Seated Hip Adduction X 20   with  5 sec hold   Seated Ham Curls Green x 30 reps bil   Tandem stance balance 3x30 secs with each foot forward    Blank cell = exercise not performed today  PATIENT EDUCATION:  Education details: Discussed his current HEP that he was instructed in the hospital. Person educated: Patient Education method: Explanation Education comprehension: verbalized understanding  HOME EXERCISE PROGRAM:   ASSESSMENT:  CLINICAL IMPRESSION: Pt arrives for today's treatment session 2/10 right hip pain. Rx focused on RT LE strengthening [place] and CKC as well as balance act.'s. Pt was able to perform exs without increased hip pain. Balance on Rocker board was the most challenging.    OBJECTIVE IMPAIRMENTS: Abnormal gait, decreased activity tolerance, decreased mobility, difficulty walking, decreased ROM, decreased strength, and pain.   ACTIVITY LIMITATIONS: carrying, lifting, bending, standing, and locomotion level  PARTICIPATION LIMITATIONS: meal prep, cleaning, and laundry  PERSONAL FACTORS: Time since onset of injury/illness/exacerbation are also affecting patient's functional outcome.   REHAB POTENTIAL: Good  CLINICAL DECISION MAKING: Stable/uncomplicated  EVALUATION COMPLEXITY: Low   GOALS:  SHORT TERM GOALS: Target date: 10/04/22  Ind with an initial HEP. Goal status: INITIAL   LONG TERM GOALS: Target date: 12/19/22.  Ind with an advanced HEP.  Goal status: INITIAL  2.  Perform a reciprocating stair pain with one railing.  Goal status: INITIAL  3.  Increase right hip strength to a solid 4+/5 to increase stability for functional tasks.  Goal status: INITIAL  4.  Patient walk a community distance with right hip pain not > 3/10.  Goal status: INITIAL  5.  Perform ADL's with right hip pain not >  3-4/10.  Goal status: INITIAL  PLAN:  PT FREQUENCY: 2x/week  PT DURATION: 6 weeks  PLANNED INTERVENTIONS: Therapeutic exercises, Therapeutic activity, Neuromuscular re-education, Gait training, Patient/Family education, Self Care, Stair training, Electrical stimulation, Cryotherapy, Moist heat, and Manual therapy  PLAN FOR NEXT SESSION: Nustep, Gait training, progress per anterior total hip replacement protocol per patient tolerance.   Alyha Marines,[place], PTA 09/30/2022, 12:32 PM

## 2022-10-04 ENCOUNTER — Ambulatory Visit: Payer: Medicare Other | Attending: Orthopedic Surgery

## 2022-10-04 DIAGNOSIS — M25651 Stiffness of right hip, not elsewhere classified: Secondary | ICD-10-CM | POA: Insufficient documentation

## 2022-10-04 DIAGNOSIS — M25551 Pain in right hip: Secondary | ICD-10-CM | POA: Insufficient documentation

## 2022-10-04 NOTE — Therapy (Signed)
OUTPATIENT PHYSICAL THERAPY LOWER EXTREMITY TREATMENT   Patient Name: Stephen Lawson MRN: [id number] DOB:01/01/1951, 72 y.o., male Today's Date: 10/04/2022  END OF SESSION:  PT End of Session - 10/04/22 1641     Visit Number 4    Number of Visits 12    Date for PT Re-Evaluation 12/19/22    Authorization Type FOTO    PT Start Time 1635    PT Stop Time 1725    PT Time Calculation (min) 50 min             Past Medical History:  Diagnosis Date   Arthritis    Back pain    Cancer (HCC)    skin cancer removed from the face   Diabetes mellitus without complication (HCC)    Hypertension    Hypothyroidism    Migraine    Past Surgical History:  Procedure Laterality Date   ARTHROSCOPY WITH ANTERIOR CRUCIATE LIGAMENT (ACL) REPAIR WITH ANTERIOR TIBILIAS GRAFT Right    BACK SURGERY     x 4   ELBOW SURGERY Bilateral    SHOULDER SURGERY Right    arthroscopy   TESTICLE SURGERY     TOTAL HIP ARTHROPLASTY Right 09/12/2022   Procedure: TOTAL HIP ARTHROPLASTY ANTERIOR APPROACH;  Surgeon: Jodi Geralds, MD;  Location: [facility];  Service: Orthopedics;  Laterality: Right;   WRIST SURGERY Right    carpal tunnel   Patient Active Problem List   Diagnosis Date Noted   Primary osteoarthritis of right hip 09/12/2022   S/P total hip arthroplasty 09/12/2022   Diabetes mellitus treated with oral medication (HCC) 07/08/2022   Hearing loss of both ears due to cerumen impaction 05/11/2017   Migraine headache 11/22/2016   Hypothyroid 11/22/2016   T2DM (type 2 diabetes mellitus) (HCC) 10/02/2015   Hypertension associated with diabetes (HCC) 10/02/2015   Chronic back pain 06/29/2015   Allergic rhinitis 02/25/2013   Dysphagia 01/22/2013   Fatty liver 12/06/2012   H/O asbestos exposure 12/06/2012   Edema 12/06/2012    REFERRING PROVIDER: Jodi Geralds MD  REFERRING DIAG: S/p anterior total hip replacement.  THERAPY DIAG:  Pain in right hip  Stiffness of right hip, not elsewhere  classified  Rationale for Evaluation and Treatment: Rehabilitation  ONSET DATE: Ongoing, 09/12/22 (surgery date).  SUBJECTIVE:   SUBJECTIVE STATEMENT: Pt reports 5/10 right hip pain with ambulation, 0/10 at rest.   PERTINENT HISTORY: Please see above. PAIN:  Are you having pain? Yes: NPRS scale: 5/10 Pain location: Right hip. Pain description: Throbbing. Aggravating factors: As above. Relieving factors: As above.  PRECAUTIONS: Anterior hip.  No ultrasound.  WEIGHT BEARING RESTRICTIONS: No  FALLS:  Has patient fallen in last 6 months? No  LIVING ENVIRONMENT: Lives with: lives with their spouse Lives in: House/apartment Stairs:  6 stairs into home with railing.  Patient performs in a non-reciprocating fashion. Has following equipment at home: Single point cane  OCCUPATION: Retired.  PLOF: Independent with basic ADLs and Independent with household mobility with device  PATIENT GOALS: Do more without right hip pain.   OBJECTIVE:   PATIENT SURVEYS:  FOTO 23.  PALPATION: C/o diffuse right hip pain currently.  LOWER EXTREMITY ROM:  In supine:  Right hip flexion (assisted) to 65 degrees.  He demonstrates -10 degrees of right knee extension via a short arc quad.  LOWER EXTREMITY MMT:  Not accurately assessed due to high pain-level.  However, he was easily able to perform a right SAQ.  GAIT: Slow and cautious gait pattern  with a straight cane on right with a decrease in step length and a tendency to external rotate.  OBSERVATION:    Aquacel intact over patient's right anterior thigh.  TRANSITIONS:  Sit to stand with armrest and sit to supine to sit with supervision only.  TODAY'S TREATMENT SESSION:   10/04/22                      RT ATHR   No SLR                                   EXERCISE LOG  Exercise Repetitions and Resistance Comments  Nustep  Lvl 3 x 15 mins seat 11   Rockerboard 4 mins   Heel/Toe Raises 2x15-20 reps   Standing Marching Airex x 3 mins    Forward Step Ups 8" box x 15 reps bil   Lateral Step Ups    LAQs 4# x 3 mins   Seated Marches 4# x 3 mins   Seated Hip Abduction Green tband x 2.5 mins   Seated Hip Adduction X2.5 mins   Seated Ham Curls Blue x 20 reps bil   Tandem stance balance     Blank cell = exercise not performed today   PATIENT EDUCATION:  Education details: Discussed his current HEP that he was instructed in the hospital. Person educated: Patient Education method: Explanation Education comprehension: verbalized understanding  HOME EXERCISE PROGRAM:   ASSESSMENT:  CLINICAL IMPRESSION: Pt arrives for today's treatment session denying any pain at rest, but does report 5/10 right hip pain with ambulation.  Pt able to tolerate introduction to standing marching on Airex pad with minimal difficulty.  Pt requiring intermittent BUE support to maintain stability and safety.  Pt able to tolerate increased time, reps, or resistance with all previously performed exercises.  Pt denied any pain at completion of today's treatment session.   OBJECTIVE IMPAIRMENTS: Abnormal gait, decreased activity tolerance, decreased mobility, difficulty walking, decreased ROM, decreased strength, and pain.   ACTIVITY LIMITATIONS: carrying, lifting, bending, standing, and locomotion level  PARTICIPATION LIMITATIONS: meal prep, cleaning, and laundry  PERSONAL FACTORS: Time since onset of injury/illness/exacerbation are also affecting patient's functional outcome.   REHAB POTENTIAL: Good  CLINICAL DECISION MAKING: Stable/uncomplicated  EVALUATION COMPLEXITY: Low   GOALS:  SHORT TERM GOALS: Target date: 10/04/22  Ind with an initial HEP. Goal status: INITIAL   LONG TERM GOALS: Target date: 12/19/22.  Ind with an advanced HEP.  Goal status: INITIAL  2.  Perform a reciprocating stair pain with one railing.  Goal status: INITIAL  3.  Increase right hip strength to a solid 4+/5 to increase stability for functional tasks.   Goal status: INITIAL  4.  Patient walk a community distance with right hip pain not > 3/10.  Goal status: INITIAL  5.  Perform ADL's with right hip pain not > 3-4/10.  Goal status: INITIAL  PLAN:  PT FREQUENCY: 2x/week  PT DURATION: 6 weeks  PLANNED INTERVENTIONS: Therapeutic exercises, Therapeutic activity, Neuromuscular re-education, Gait training, Patient/Family education, Self Care, Stair training, Electrical stimulation, Cryotherapy, Moist heat, and Manual therapy  PLAN FOR NEXT SESSION: Nustep, Gait training, progress per anterior total hip replacement protocol per patient tolerance.   Newman Pies, PTA 10/04/2022, 5:28 PM

## 2022-10-06 ENCOUNTER — Ambulatory Visit: Payer: Medicare Other

## 2022-10-06 DIAGNOSIS — M25551 Pain in right hip: Secondary | ICD-10-CM

## 2022-10-06 DIAGNOSIS — M25651 Stiffness of right hip, not elsewhere classified: Secondary | ICD-10-CM | POA: Diagnosis not present

## 2022-10-06 NOTE — Therapy (Signed)
OUTPATIENT PHYSICAL THERAPY LOWER EXTREMITY TREATMENT   Patient Name: Stephen Lawson MRN: [id number] DOB:02/15/1950, 72 y.o., male Today's Date: 10/06/2022  END OF SESSION:  PT End of Session - 10/06/22 1651     Visit Number 5    Number of Visits 12    Date for PT Re-Evaluation 12/19/22    Authorization Type FOTO    PT Start Time 1645    PT Stop Time 1730    PT Time Calculation (min) 45 min             Past Medical History:  Diagnosis Date   Arthritis    Back pain    Cancer (HCC)    skin cancer removed from the face   Diabetes mellitus without complication (HCC)    Hypertension    Hypothyroidism    Migraine    Past Surgical History:  Procedure Laterality Date   ARTHROSCOPY WITH ANTERIOR CRUCIATE LIGAMENT (ACL) REPAIR WITH ANTERIOR TIBILIAS GRAFT Right    BACK SURGERY     x 4   ELBOW SURGERY Bilateral    SHOULDER SURGERY Right    arthroscopy   TESTICLE SURGERY     TOTAL HIP ARTHROPLASTY Right 09/12/2022   Procedure: TOTAL HIP ARTHROPLASTY ANTERIOR APPROACH;  Surgeon: Jodi Geralds, MD;  Location: [facility];  Service: Orthopedics;  Laterality: Right;   WRIST SURGERY Right    carpal tunnel   Patient Active Problem List   Diagnosis Date Noted   Primary osteoarthritis of right hip 09/12/2022   S/P total hip arthroplasty 09/12/2022   Diabetes mellitus treated with oral medication (HCC) 07/08/2022   Hearing loss of both ears due to cerumen impaction 05/11/2017   Migraine headache 11/22/2016   Hypothyroid 11/22/2016   T2DM (type 2 diabetes mellitus) (HCC) 10/02/2015   Hypertension associated with diabetes (HCC) 10/02/2015   Chronic back pain 06/29/2015   Allergic rhinitis 02/25/2013   Dysphagia 01/22/2013   Fatty liver 12/06/2012   H/O asbestos exposure 12/06/2012   Edema 12/06/2012    REFERRING PROVIDER: Jodi Geralds MD  REFERRING DIAG: S/p anterior total hip replacement.  THERAPY DIAG:  Pain in right hip  Stiffness of right hip, not elsewhere  classified  Rationale for Evaluation and Treatment: Rehabilitation  ONSET DATE: Ongoing, 09/12/22 (surgery date).  SUBJECTIVE:   SUBJECTIVE STATEMENT: Pt reports 3/10 right hip pain with ambulation, 0/10 at rest.   PERTINENT HISTORY: Please see above. PAIN:  Are you having pain? Yes: NPRS scale: 3/10 Pain location: Right hip. Pain description: Throbbing. Aggravating factors: As above. Relieving factors: As above.  PRECAUTIONS: Anterior hip.  No ultrasound.  WEIGHT BEARING RESTRICTIONS: No  FALLS:  Has patient fallen in last 6 months? No  LIVING ENVIRONMENT: Lives with: lives with their spouse Lives in: House/apartment Stairs:  6 stairs into home with railing.  Patient performs in a non-reciprocating fashion. Has following equipment at home: Single point cane  OCCUPATION: Retired.  PLOF: Independent with basic ADLs and Independent with household mobility with device  PATIENT GOALS: Do more without right hip pain.   OBJECTIVE:   PATIENT SURVEYS:  FOTO 23.  PALPATION: C/o diffuse right hip pain currently.  LOWER EXTREMITY ROM:  In supine:  Right hip flexion (assisted) to 65 degrees.  He demonstrates -10 degrees of right knee extension via a short arc quad.  LOWER EXTREMITY MMT:  Not accurately assessed due to high pain-level.  However, he was easily able to perform a right SAQ.  GAIT: Slow and cautious gait pattern  with a straight cane on right with a decrease in step length and a tendency to external rotate.  OBSERVATION:    Aquacel intact over patient's right anterior thigh.  TRANSITIONS:  Sit to stand with armrest and sit to supine to sit with supervision only.  TODAY'S TREATMENT SESSION:   10/06/22                      RT ATHR   No SLR                                   EXERCISE LOG  Exercise Repetitions and Resistance Comments  Nustep  Lvl 3 x 15 mins seat 11   Cybex Knee Flexion 30# x 3 mins   Cybex Knee Extension 10# x 3 mins   Cybex Leg Press  Plates 2; seat 7; x 3 mins   Rockerboard 4 mins   Heel/Toe Raises    Standing Marching    Forward Step Ups 8" step x 20 reps    Lateral Step Ups    LAQs    Seated Marches    Seated Hip Abduction Blue x 3 mins   Seated Hip Adduction    Seated Ham Curls    Tandem stance balance     Blank cell = exercise not performed today   PATIENT EDUCATION:  Education details: Discussed his current HEP that he was instructed in the hospital. Person educated: Patient Education method: Explanation Education comprehension: verbalized understanding  HOME EXERCISE PROGRAM:   ASSESSMENT:  CLINICAL IMPRESSION: Pt arrives for today's treatment session reporting 3/10 right hip pain today.  Pt able to increase FOTO score to 62 today.  Pt introduced to cybex machinery today without increase in pain.  Pt requiring min cues for eccentric control.  Pt denied any change in pain at completion of today's treatment session.   OBJECTIVE IMPAIRMENTS: Abnormal gait, decreased activity tolerance, decreased mobility, difficulty walking, decreased ROM, decreased strength, and pain.   ACTIVITY LIMITATIONS: carrying, lifting, bending, standing, and locomotion level  PARTICIPATION LIMITATIONS: meal prep, cleaning, and laundry  PERSONAL FACTORS: Time since onset of injury/illness/exacerbation are also affecting patient's functional outcome.   REHAB POTENTIAL: Good  CLINICAL DECISION MAKING: Stable/uncomplicated  EVALUATION COMPLEXITY: Low   GOALS:  SHORT TERM GOALS: Target date: 10/04/22  Ind with an initial HEP. Goal status: MET   LONG TERM GOALS: Target date: 12/19/22.  Ind with an advanced HEP.  Goal status: IN PROGRESS  2.  Perform a reciprocating stair pain with one railing.  Goal status: IN PROGRESS  3.  Increase right hip strength to a solid 4+/5 to increase stability for functional tasks.  Goal status: IN PROGRESS  4.  Patient walk a community distance with right hip pain not > 3/10.  Goal  status: IN PROGRESS  5.  Perform ADL's with right hip pain not > 3-4/10.  Goal status: IN PROGRESS  PLAN:  PT FREQUENCY: 2x/week  PT DURATION: 6 weeks  PLANNED INTERVENTIONS: Therapeutic exercises, Therapeutic activity, Neuromuscular re-education, Gait training, Patient/Family education, Self Care, Stair training, Electrical stimulation, Cryotherapy, Moist heat, and Manual therapy  PLAN FOR NEXT SESSION: Nustep, Gait training, progress per anterior total hip replacement protocol per patient tolerance.   Newman Pies, PTA 10/06/2022, 5:44 PM

## 2022-10-11 ENCOUNTER — Ambulatory Visit: Payer: Medicare Other | Admitting: Physical Therapy

## 2022-10-11 DIAGNOSIS — M25651 Stiffness of right hip, not elsewhere classified: Secondary | ICD-10-CM

## 2022-10-11 DIAGNOSIS — M25551 Pain in right hip: Secondary | ICD-10-CM

## 2022-10-11 NOTE — Therapy (Signed)
OUTPATIENT PHYSICAL THERAPY LOWER EXTREMITY TREATMENT   Patient Name: Stephen Lawson MRN: [id number] DOB:21-Oct-1950, 72 y.o., male Today's Date: 10/11/2022  END OF SESSION:  PT End of Session - 10/11/22 1700     Visit Number 6    Date for PT Re-Evaluation 12/19/22    Authorization Type FOTO    PT Start Time 0445    PT Stop Time 0528    PT Time Calculation (min) 43 min    Activity Tolerance Patient tolerated treatment well    Behavior During Therapy WFL for tasks assessed/performed             Past Medical History:  Diagnosis Date   Arthritis    Back pain    Cancer (HCC)    skin cancer removed from the face   Diabetes mellitus without complication (HCC)    Hypertension    Hypothyroidism    Migraine    Past Surgical History:  Procedure Laterality Date   ARTHROSCOPY WITH ANTERIOR CRUCIATE LIGAMENT (ACL) REPAIR WITH ANTERIOR TIBILIAS GRAFT Right    BACK SURGERY     x 4   ELBOW SURGERY Bilateral    SHOULDER SURGERY Right    arthroscopy   TESTICLE SURGERY     TOTAL HIP ARTHROPLASTY Right 09/12/2022   Procedure: TOTAL HIP ARTHROPLASTY ANTERIOR APPROACH;  Surgeon: Jodi Geralds, MD;  Location: [facility];  Service: Orthopedics;  Laterality: Right;   WRIST SURGERY Right    carpal tunnel   Patient Active Problem List   Diagnosis Date Noted   Primary osteoarthritis of right hip 09/12/2022   S/P total hip arthroplasty 09/12/2022   Diabetes mellitus treated with oral medication (HCC) 07/08/2022   Hearing loss of both ears due to cerumen impaction 05/11/2017   Migraine headache 11/22/2016   Hypothyroid 11/22/2016   T2DM (type 2 diabetes mellitus) (HCC) 10/02/2015   Hypertension associated with diabetes (HCC) 10/02/2015   Chronic back pain 06/29/2015   Allergic rhinitis 02/25/2013   Dysphagia 01/22/2013   Fatty liver 12/06/2012   H/O asbestos exposure 12/06/2012   Edema 12/06/2012    REFERRING PROVIDER: Jodi Geralds MD  REFERRING DIAG: S/p anterior total hip  replacement.  THERAPY DIAG:  Pain in right hip  Stiffness of right hip, not elsewhere classified  Rationale for Evaluation and Treatment: Rehabilitation  ONSET DATE: Ongoing, 09/12/22 (surgery date).  SUBJECTIVE:   SUBJECTIVE STATEMENT: Doing better.  Pain about the same.  PERTINENT HISTORY: Please see above. PAIN:  Are you having pain? Yes: NPRS scale: 3/10 Pain location: Right hip. Pain description: Throbbing. Aggravating factors: As above. Relieving factors: As above.  PRECAUTIONS: Anterior hip.  No ultrasound.  WEIGHT BEARING RESTRICTIONS: No  FALLS:  Has patient fallen in last 6 months? No  LIVING ENVIRONMENT: Lives with: lives with their spouse Lives in: House/apartment Stairs:  6 stairs into home with railing.  Patient performs in a non-reciprocating fashion. Has following equipment at home: Single point cane  OCCUPATION: Retired.  PLOF: Independent with basic ADLs and Independent with household mobility with device  PATIENT GOALS: Do more without right hip pain.   OBJECTIVE:   PATIENT SURVEYS:  FOTO 23.  PALPATION: C/o diffuse right hip pain currently.  LOWER EXTREMITY ROM:  In supine:  Right hip flexion (assisted) to 65 degrees.  He demonstrates -10 degrees of right knee extension via a short arc quad.  LOWER EXTREMITY MMT:  Not accurately assessed due to high pain-level.  However, he was easily able to perform a right SAQ.  GAIT: Slow and cautious gait pattern with a straight cane on right with a decrease in step length and a tendency to external rotate.  OBSERVATION:    Aquacel intact over patient's right anterior thigh.  TRANSITIONS:  Sit to stand with armrest and sit to supine to sit with supervision only.  TODAY'S TREATMENT SESSION:    10/11/22:                                     EXERCISE LOG  Exercise Repetitions and Resistance Comments  Nustep  Level 4 x 15 minutes   Rockerboard 4 minutes   Knee ext  10# x 3 minutes   Ham  curls 40# x 3 minutes   Seated abd Blue theraband x 3 minutes   Resisted 4 way walking Blue XTS band 1 minute each direction.      10/06/22                      RT ATHR   No SLR                                   EXERCISE LOG  Exercise Repetitions and Resistance Comments  Nustep  Lvl 3 x 15 mins seat 11   Cybex Knee Flexion 30# x 3 mins   Cybex Knee Extension 10# x 3 mins   Cybex Leg Press Plates 2; seat 7; x 3 mins   Rockerboard 4 mins   Heel/Toe Raises    Standing Marching    Forward Step Ups 8" step x 20 reps    Lateral Step Ups    LAQs    Seated Marches    Seated Hip Abduction Blue x 3 mins   Seated Hip Adduction    Seated Ham Curls    Tandem stance balance     Blank cell = exercise not performed today   PATIENT EDUCATION:  Education details: Discussed his current HEP that he was instructed in the hospital. Person educated: Patient Education method: Explanation Education comprehension: verbalized understanding  HOME EXERCISE PROGRAM:   ASSESSMENT:  CLINICAL IMPRESSION: Patient pleased with his progress and presented to the clinic today without assistive device.  Added resisted walking today for neuro re-edu which he performed very well without complaint.  OBJECTIVE IMPAIRMENTS: Abnormal gait, decreased activity tolerance, decreased mobility, difficulty walking, decreased ROM, decreased strength, and pain.   ACTIVITY LIMITATIONS: carrying, lifting, bending, standing, and locomotion level  PARTICIPATION LIMITATIONS: meal prep, cleaning, and laundry  PERSONAL FACTORS: Time since onset of injury/illness/exacerbation are also affecting patient's functional outcome.   REHAB POTENTIAL: Good  CLINICAL DECISION MAKING: Stable/uncomplicated  EVALUATION COMPLEXITY: Low   GOALS:  SHORT TERM GOALS: Target date: 10/04/22  Ind with an initial HEP. Goal status: MET   LONG TERM GOALS: Target date: 12/19/22.  Ind with an advanced HEP.  Goal status: IN PROGRESS  2.   Perform a reciprocating stair pain with one railing.  Goal status: IN PROGRESS  3.  Increase right hip strength to a solid 4+/5 to increase stability for functional tasks.  Goal status: IN PROGRESS  4.  Patient walk a community distance with right hip pain not > 3/10.  Goal status: IN PROGRESS  5.  Perform ADL's with right hip pain not > 3-4/10.  Goal status: IN PROGRESS  PLAN:  PT FREQUENCY: 2x/week  PT DURATION: 6 weeks  PLANNED INTERVENTIONS: Therapeutic exercises, Therapeutic activity, Neuromuscular re-education, Gait training, Patient/Family education, Self Care, Stair training, Electrical stimulation, Cryotherapy, Moist heat, and Manual therapy  PLAN FOR NEXT SESSION: Nustep, Gait training, progress per anterior total hip replacement protocol per patient tolerance.   Jermany Rimel, [place], PT 10/11/2022, 5:58 PM

## 2022-10-13 ENCOUNTER — Ambulatory Visit: Payer: Medicare Other | Admitting: Physical Therapy

## 2022-10-13 ENCOUNTER — Encounter: Payer: Self-pay | Admitting: Family Medicine

## 2022-10-13 ENCOUNTER — Ambulatory Visit (INDEPENDENT_AMBULATORY_CARE_PROVIDER_SITE_OTHER): Payer: Medicare Other | Admitting: Family Medicine

## 2022-10-13 VITALS — BP 117/66 | HR 71 | Ht 71.0 in | Wt 240.0 lb

## 2022-10-13 DIAGNOSIS — M25651 Stiffness of right hip, not elsewhere classified: Secondary | ICD-10-CM

## 2022-10-13 DIAGNOSIS — Z7984 Long term (current) use of oral hypoglycemic drugs: Secondary | ICD-10-CM

## 2022-10-13 DIAGNOSIS — E1142 Type 2 diabetes mellitus with diabetic polyneuropathy: Secondary | ICD-10-CM | POA: Diagnosis not present

## 2022-10-13 DIAGNOSIS — E1159 Type 2 diabetes mellitus with other circulatory complications: Secondary | ICD-10-CM | POA: Diagnosis not present

## 2022-10-13 DIAGNOSIS — I152 Hypertension secondary to endocrine disorders: Secondary | ICD-10-CM | POA: Diagnosis not present

## 2022-10-13 DIAGNOSIS — E119 Type 2 diabetes mellitus without complications: Secondary | ICD-10-CM

## 2022-10-13 DIAGNOSIS — E039 Hypothyroidism, unspecified: Secondary | ICD-10-CM | POA: Diagnosis not present

## 2022-10-13 DIAGNOSIS — M25551 Pain in right hip: Secondary | ICD-10-CM | POA: Diagnosis not present

## 2022-10-13 LAB — BAYER DCA HB A1C WAIVED: HB A1C (BAYER DCA - WAIVED): 6.7 % — ABNORMAL HIGH (ref 4.8–5.6)

## 2022-10-13 NOTE — Progress Notes (Signed)
BP (!) 153/76   Pulse 71   Ht 5\' 11"  (1.803 m)   Wt 240 lb (108.9 kg)   SpO2 97%   BMI 33.47 kg/m    Subjective:   Patient ID: Stephen Lawson, male    DOB: 03-27-1950, 72 y.o.   MRN: [id number]  HPI: Stephen Lawson is a 72 y.o. male presenting on 10/13/2022 for Medical Management of Chronic Issues, Diabetes, and Hypertension   HPI Type 2 diabetes mellitus Patient comes in today for recheck of his diabetes. Patient has been currently taking metformin and glimepiride. Patient is currently on an ACE inhibitor/ARB. Patient has not seen an ophthalmologist this year. Patient denies any new issues with their feet. The symptom started onset as an adult hypertension and hypothyroidism ARE RELATED TO DM   Hypertension Patient is currently on amlodipine and atenolol and lisinopril, and their blood pressure today is 153/76. Patient denies any lightheadedness or dizziness. Patient denies headaches, blurred vision, chest pains, shortness of breath, or weakness. Denies any side effects from medication and is content with current medication.   Hypothyroidism recheck Patient is coming in for thyroid recheck today as well. They deny any issues with hair changes or heat or cold problems or diarrhea or constipation. They deny any chest pain or palpitations. They are currently on levothyroxine 112 micrograms   Relevant past medical, surgical, family and social history reviewed and updated as indicated. Interim medical history since our last visit reviewed. Allergies and medications reviewed and updated.  Review of Systems  Constitutional:  Negative for chills and fever.  Eyes:  Negative for visual disturbance.  Respiratory:  Negative for shortness of breath and wheezing.   Cardiovascular:  Negative for chest pain and leg swelling.  Musculoskeletal:  Positive for arthralgias and gait problem. Negative for back pain.  Skin:  Negative for rash.  Neurological:  Negative for dizziness, weakness and  light-headedness.  All other systems reviewed and are negative.   Per HPI unless specifically indicated above   Allergies as of 10/13/2022       Reactions   Darvocet [propoxyphene N-acetaminophen] Itching      Talwin [pentazocine] Itching   Ultram [tramadol]         Medication List        Accurate as of October 13, 2022  9:58 AM. If you have any questions, ask your nurse or doctor.          STOP taking these medications    docusate sodium 100 MG capsule Commonly known as: Colace Stopped by: Elige Radon Michaelangelo Mittelman   oxyCODONE-acetaminophen 5-325 MG tablet Commonly known as: PERCOCET/ROXICET Stopped by: Elige Radon Kahlin Mark       TAKE these medications    amLODipine 10 MG tablet Commonly known as: NORVASC Take 1 tablet (10 mg total) by mouth daily.   aspirin EC 325 MG tablet Take 1 tablet (325 mg total) by mouth 2 (two) times daily after a meal. Take x 1 month post op to decrease risk of blood clots.   atenolol 100 MG tablet Commonly known as: TENORMIN Take 1 tablet (100 mg total) by mouth daily.   blood glucose meter kit and supplies Kit Dispense based on patient and insurance preference. Use up to four times daily as directed. (FOR ICD-9 250.00, 250.01).   celecoxib 200 MG capsule Commonly known as: CeleBREX Take 1 capsule (200 mg total) by mouth 2 (two) times daily.   glimepiride 4 MG tablet Commonly known as: AMARYL Take 1  tablet (4 mg total) by mouth daily before breakfast.   hydrocortisone 2.5 % cream Apply 1 Application topically 2 (two) times daily as needed (dermatitis).   levothyroxine 112 MCG tablet Commonly known as: SYNTHROID Take 1 tablet (112 mcg total) by mouth daily.   lisinopril 20 MG tablet Commonly known as: ZESTRIL Take 1 tablet (20 mg total) by mouth daily.   metFORMIN 500 MG tablet Commonly known as: GLUCOPHAGE Take 2 tablets (1,000 mg total) by mouth 2 (two) times daily with a meal.   methadone 10 MG tablet Commonly  known as: DOLOPHINE Take 10 mg by mouth 2 (two) times daily.   tiZANidine 2 MG tablet Commonly known as: ZANAFLEX Take 1 tablet (2 mg total) by mouth every 8 (eight) hours as needed for muscle spasms.         Objective:   BP (!) 153/76   Pulse 71   Ht 5\' 11"  (1.803 m)   Wt 240 lb (108.9 kg)   SpO2 97%   BMI 33.47 kg/m   Wt Readings from Last 3 Encounters:  10/13/22 240 lb (108.9 kg)  09/12/22 236 lb (107 kg)  08/31/22 236 lb (107 kg)    Physical Exam Vitals and nursing note reviewed.  Constitutional:      General: He is not in acute distress.    Appearance: He is well-developed. He is not diaphoretic.  Eyes:     General: No scleral icterus.    Conjunctiva/sclera: Conjunctivae normal.  Neck:     Thyroid: No thyromegaly.  Cardiovascular:     Rate and Rhythm: Normal rate and regular rhythm.     Heart sounds: Normal heart sounds. No murmur heard. Pulmonary:     Effort: Pulmonary effort is normal. No respiratory distress.     Breath sounds: Normal breath sounds. No wheezing.  Musculoskeletal:        General: No swelling. Normal range of motion.     Cervical back: Neck supple.  Lymphadenopathy:     Cervical: No cervical adenopathy.  Skin:    General: Skin is warm and dry.     Findings: No rash.  Neurological:     Mental Status: He is alert and oriented to person, place, and time.     Coordination: Coordination normal.  Psychiatric:        Behavior: Behavior normal.       Assessment & Plan:   Problem List Items Addressed This Visit       Cardiovascular and Mediastinum   Hypertension associated with diabetes (HCC)     Endocrine   T2DM (type 2 diabetes mellitus) (HCC) - Primary   Relevant Orders   Bayer DCA Hb A1c Waived   Hypothyroid   Diabetes mellitus treated with oral medication (HCC)    A1c looks good at 6.7.  No changes there. Blood pressure slightly elevated but he is still in recovery and rehab from a joint replacement so it may be affecting  it with some pain to keep a close eye on it in the future.  He does not have a home blood pressure cuff Follow up plan: Return in about 3 months (around 01/13/2023), or if symptoms worsen or fail to improve, for dm and htn and thyroid.  Counseling provided for all of the vaccine components Orders Placed This Encounter  Procedures   Bayer DCA Hb A1c Waived    Arville Care, MD [facility] 10/13/2022, 9:58 AM

## 2022-10-13 NOTE — Therapy (Signed)
OUTPATIENT PHYSICAL THERAPY LOWER EXTREMITY TREATMENT   Patient Name: Stephen Lawson MRN: [id number] DOB:March 22, 1950, 72 y.o., male Today's Date: 10/13/2022  END OF SESSION:  PT End of Session - 10/13/22 1635     Visit Number 7    Number of Visits 12    Date for PT Re-Evaluation 12/19/22    Authorization Type FOTO    PT Start Time 0435    PT Stop Time 0513    PT Time Calculation (min) 38 min    Activity Tolerance Patient tolerated treatment well    Behavior During Therapy WFL for tasks assessed/performed             Past Medical History:  Diagnosis Date   Arthritis    Back pain    Cancer (HCC)    skin cancer removed from the face   Diabetes mellitus without complication (HCC)    Hypertension    Hypothyroidism    Migraine    Past Surgical History:  Procedure Laterality Date   ARTHROSCOPY WITH ANTERIOR CRUCIATE LIGAMENT (ACL) REPAIR WITH ANTERIOR TIBILIAS GRAFT Right    BACK SURGERY     x 4   ELBOW SURGERY Bilateral    SHOULDER SURGERY Right    arthroscopy   TESTICLE SURGERY     TOTAL HIP ARTHROPLASTY Right 09/12/2022   Procedure: TOTAL HIP ARTHROPLASTY ANTERIOR APPROACH;  Surgeon: Jodi Geralds, MD;  Location: [facility];  Service: Orthopedics;  Laterality: Right;   WRIST SURGERY Right    carpal tunnel   Patient Active Problem List   Diagnosis Date Noted   Primary osteoarthritis of right hip 09/12/2022   S/P total hip arthroplasty 09/12/2022   Diabetes mellitus treated with oral medication (HCC) 07/08/2022   Hearing loss of both ears due to cerumen impaction 05/11/2017   Migraine headache 11/22/2016   Hypothyroid 11/22/2016   T2DM (type 2 diabetes mellitus) (HCC) 10/02/2015   Hypertension associated with diabetes (HCC) 10/02/2015   Chronic back pain 06/29/2015   Allergic rhinitis 02/25/2013   Dysphagia 01/22/2013   Fatty liver 12/06/2012   H/O asbestos exposure 12/06/2012   Edema 12/06/2012    REFERRING PROVIDER: Jodi Geralds MD  REFERRING DIAG: S/p anterior  total hip replacement.  THERAPY DIAG:  Pain in right hip  Stiffness of right hip, not elsewhere classified  Rationale for Evaluation and Treatment: Rehabilitation  ONSET DATE: Ongoing, 09/12/22 (surgery date).  SUBJECTIVE:   SUBJECTIVE STATEMENT: Doing good.  PERTINENT HISTORY: Please see above. PAIN:  Are you having pain? Yes: NPRS scale: 2-3/10 Pain location: Right hip. Pain description: Throbbing. Aggravating factors: As above. Relieving factors: As above.  PRECAUTIONS: Anterior hip.  No ultrasound.  WEIGHT BEARING RESTRICTIONS: No  FALLS:  Has patient fallen in last 6 months? No  LIVING ENVIRONMENT: Lives with: lives with their spouse Lives in: House/apartment Stairs:  6 stairs into home with railing.  Patient performs in a non-reciprocating fashion. Has following equipment at home: Single point cane  OCCUPATION: Retired.  PLOF: Independent with basic ADLs and Independent with household mobility with device  PATIENT GOALS: Do more without right hip pain.   OBJECTIVE:   PATIENT SURVEYS:  FOTO 23.  PALPATION: C/o diffuse right hip pain currently.  LOWER EXTREMITY ROM:  In supine:  Right hip flexion (assisted) to 65 degrees.  He demonstrates -10 degrees of right knee extension via a short arc quad.  LOWER EXTREMITY MMT:  Not accurately assessed due to high pain-level.  However, he was easily able to perform a  right SAQ.  GAIT: Slow and cautious gait pattern with a straight cane on right with a decrease in step length and a tendency to external rotate.  OBSERVATION:    Aquacel intact over patient's right anterior thigh.  TRANSITIONS:  Sit to stand with armrest and sit to supine to sit with supervision only.  TODAY'S TREATMENT SESSION:    10/13/22:                                     EXERCISE LOG  Exercise Repetitions and Resistance Comments  Nustep Level 4 x 15 minutes   Rockerboard 4 minutes   Knee ext 10# x 3 minutes   Ham curls 40# x  3 minutes   Seated abd Blue theraband x 3 minutes   Seated hip flexion 2#, 2 sets to fatigue.     10/11/22:                                     EXERCISE LOG  Exercise Repetitions and Resistance Comments  Nustep  Level 4 x 15 minutes   Rockerboard 4 minutes   Knee ext  10# x 3 minutes   Ham curls 40# x 3 minutes   Seated abd Blue theraband x 3 minutes   Resisted 4 way walking Blue XTS band 1 minute each direction.      10/06/22                      RT ATHR   No SLR                                   EXERCISE LOG  Exercise Repetitions and Resistance Comments  Nustep  Lvl 3 x 15 mins seat 11   Cybex Knee Flexion 30# x 3 mins   Cybex Knee Extension 10# x 3 mins   Cybex Leg Press Plates 2; seat 7; x 3 mins   Rockerboard 4 mins   Heel/Toe Raises    Standing Marching    Forward Step Ups 8" step x 20 reps    Lateral Step Ups    LAQs    Seated Marches    Seated Hip Abduction Blue x 3 mins   Seated Hip Adduction    Seated Ham Curls    Tandem stance balance     Blank cell = exercise not performed today   PATIENT EDUCATION:  Education details: Discussed his current HEP that he was instructed in the hospital. Person educated: Patient Education method: Explanation Education comprehension: verbalized understanding  HOME EXERCISE PROGRAM:   ASSESSMENT:  CLINICAL IMPRESSION: The patient is making excellent progress with a FOTO limitation score exceeding target at this point in his rehab.  He performed all activities without compliant.  OBJECTIVE IMPAIRMENTS: Abnormal gait, decreased activity tolerance, decreased mobility, difficulty walking, decreased ROM, decreased strength, and pain.   ACTIVITY LIMITATIONS: carrying, lifting, bending, standing, and locomotion level  PARTICIPATION LIMITATIONS: meal prep, cleaning, and laundry  PERSONAL FACTORS: Time since onset of injury/illness/exacerbation are also affecting patient's functional outcome.   REHAB POTENTIAL:  Good  CLINICAL DECISION MAKING: Stable/uncomplicated  EVALUATION COMPLEXITY: Low   GOALS:  SHORT TERM GOALS: Target date: 10/04/22  Ind with an initial HEP. Goal status: MET  LONG TERM GOALS: Target date: 12/19/22.  Ind with an advanced HEP.  Goal status: IN PROGRESS  2.  Perform a reciprocating stair pain with one railing.  Goal status: IN PROGRESS  3.  Increase right hip strength to a solid 4+/5 to increase stability for functional tasks.  Goal status: IN PROGRESS  4.  Patient walk a community distance with right hip pain not > 3/10.  Goal status: IN PROGRESS  5.  Perform ADL's with right hip pain not > 3-4/10.  Goal status: IN PROGRESS  PLAN:  PT FREQUENCY: 2x/week  PT DURATION: 6 weeks  PLANNED INTERVENTIONS: Therapeutic exercises, Therapeutic activity, Neuromuscular re-education, Gait training, Patient/Family education, Self Care, Stair training, Electrical stimulation, Cryotherapy, Moist heat, and Manual therapy  PLAN FOR NEXT SESSION: Nustep, Gait training, progress per anterior total hip replacement protocol per patient tolerance.   Elkin Belfield, [place], PT 10/13/2022, 5:35 PM

## 2022-10-18 ENCOUNTER — Ambulatory Visit: Payer: Medicare Other

## 2022-10-18 DIAGNOSIS — M25551 Pain in right hip: Secondary | ICD-10-CM | POA: Diagnosis not present

## 2022-10-18 DIAGNOSIS — M25651 Stiffness of right hip, not elsewhere classified: Secondary | ICD-10-CM | POA: Diagnosis not present

## 2022-10-18 NOTE — Therapy (Signed)
OUTPATIENT PHYSICAL THERAPY LOWER EXTREMITY TREATMENT   Patient Name: Stephen Lawson MRN: [id number] DOB:Jan 19, 1950, 72 y.o., male Today's Date: 10/18/2022  END OF SESSION:  PT End of Session - 10/18/22 1519     Visit Number 8    Number of Visits 12    Date for PT Re-Evaluation 12/19/22    Authorization Type FOTO    PT Start Time 1515    PT Stop Time 1557    PT Time Calculation (min) 42 min    Activity Tolerance Patient tolerated treatment well    Behavior During Therapy WFL for tasks assessed/performed             Past Medical History:  Diagnosis Date   Arthritis    Back pain    Cancer (HCC)    skin cancer removed from the face   Diabetes mellitus without complication (HCC)    Hypertension    Hypothyroidism    Migraine    Past Surgical History:  Procedure Laterality Date   ARTHROSCOPY WITH ANTERIOR CRUCIATE LIGAMENT (ACL) REPAIR WITH ANTERIOR TIBILIAS GRAFT Right    BACK SURGERY     x 4   ELBOW SURGERY Bilateral    SHOULDER SURGERY Right    arthroscopy   TESTICLE SURGERY     TOTAL HIP ARTHROPLASTY Right 09/12/2022   Procedure: TOTAL HIP ARTHROPLASTY ANTERIOR APPROACH;  Surgeon: Jodi Geralds, MD;  Location: [facility];  Service: Orthopedics;  Laterality: Right;   WRIST SURGERY Right    carpal tunnel   Patient Active Problem List   Diagnosis Date Noted   Primary osteoarthritis of right hip 09/12/2022   S/P total hip arthroplasty 09/12/2022   Diabetes mellitus treated with oral medication (HCC) 07/08/2022   Hearing loss of both ears due to cerumen impaction 05/11/2017   Migraine headache 11/22/2016   Hypothyroid 11/22/2016   T2DM (type 2 diabetes mellitus) (HCC) 10/02/2015   Hypertension associated with diabetes (HCC) 10/02/2015   Chronic back pain 06/29/2015   Allergic rhinitis 02/25/2013   Dysphagia 01/22/2013   Fatty liver 12/06/2012   H/O asbestos exposure 12/06/2012   Edema 12/06/2012    REFERRING PROVIDER: Jodi Geralds MD  REFERRING DIAG: S/p anterior  total hip replacement.  THERAPY DIAG:  Pain in right hip  Stiffness of right hip, not elsewhere classified  Rationale for Evaluation and Treatment: Rehabilitation  ONSET DATE: Ongoing, 09/12/22 (surgery date).  SUBJECTIVE:   SUBJECTIVE STATEMENT: Doing good.  PERTINENT HISTORY: Please see above. PAIN:  Are you having pain? Yes: NPRS scale: 2-3/10 Pain location: Right hip. Pain description: Throbbing. Aggravating factors: As above. Relieving factors: As above.  PRECAUTIONS: Anterior hip.  No ultrasound.  WEIGHT BEARING RESTRICTIONS: No  FALLS:  Has patient fallen in last 6 months? No  LIVING ENVIRONMENT: Lives with: lives with their spouse Lives in: House/apartment Stairs:  6 stairs into home with railing.  Patient performs in a non-reciprocating fashion. Has following equipment at home: Single point cane  OCCUPATION: Retired.  PLOF: Independent with basic ADLs and Independent with household mobility with device  PATIENT GOALS: Do more without right hip pain.   OBJECTIVE:   PATIENT SURVEYS:  FOTO 23.  PALPATION: C/o diffuse right hip pain currently.  LOWER EXTREMITY ROM:  In supine:  Right hip flexion (assisted) to 65 degrees.  He demonstrates -10 degrees of right knee extension via a short arc quad.  LOWER EXTREMITY MMT:  Not accurately assessed due to high pain-level.  However, he was easily able to perform a  right SAQ.  GAIT: Slow and cautious gait pattern with a straight cane on right with a decrease in step length and a tendency to external rotate.  OBSERVATION:    Aquacel intact over patient's right anterior thigh.  TRANSITIONS:  Sit to stand with armrest and sit to supine to sit with supervision only.  TODAY'S TREATMENT SESSION:    10/17/22:                                   EXERCISE LOG  Exercise Repetitions and Resistance Comments  Nustep Level 5 x 15 minutes   Rockerboard 5 minutes   Knee ext 10# x 5 minutes   Ham curls 40# x 5  minutes   Standing hip abd 3# x 20 reps RLE   Standing Marches Airex x 3 mins     10/11/22:                                   EXERCISE LOG  Exercise Repetitions and Resistance Comments  Nustep  Level 4 x 15 minutes   Rockerboard 4 minutes   Knee ext  10# x 3 minutes   Ham curls 40# x 3 minutes   Seated abd Blue theraband x 3 minutes   Resisted 4 way walking Blue XTS band 1 minute each direction.     PATIENT EDUCATION:  Education details: Discussed his current HEP that he was instructed in the hospital. Person educated: Patient Education method: Explanation Education comprehension: verbalized understanding  HOME EXERCISE PROGRAM:   ASSESSMENT:  CLINICAL IMPRESSION: Pt arrives for today's treatment session denying any pain.  Pt reports that hip is feeling much better as of late.  Pt able to tolerate increased time with all exercises today without issue or discomfort.  Pt reports that he has an MD appointment next Tuesday.  Pt denied any pain at completion of today's treatment session.  OBJECTIVE IMPAIRMENTS: Abnormal gait, decreased activity tolerance, decreased mobility, difficulty walking, decreased ROM, decreased strength, and pain.   ACTIVITY LIMITATIONS: carrying, lifting, bending, standing, and locomotion level  PARTICIPATION LIMITATIONS: meal prep, cleaning, and laundry  PERSONAL FACTORS: Time since onset of injury/illness/exacerbation are also affecting patient's functional outcome.   REHAB POTENTIAL: Good  CLINICAL DECISION MAKING: Stable/uncomplicated  EVALUATION COMPLEXITY: Low   GOALS:  SHORT TERM GOALS: Target date: 10/04/22  Ind with an initial HEP. Goal status: MET   LONG TERM GOALS: Target date: 12/19/22.  Ind with an advanced HEP.  Goal status: IN PROGRESS  2.  Perform a reciprocating stair pain with one railing.  Goal status: IN PROGRESS  3.  Increase right hip strength to a solid 4+/5 to increase stability for functional tasks.  Goal  status: IN PROGRESS  4.  Patient walk a community distance with right hip pain not > 3/10.  Goal status: IN PROGRESS  5.  Perform ADL's with right hip pain not > 3-4/10.  Goal status: IN PROGRESS  PLAN:  PT FREQUENCY: 2x/week  PT DURATION: 6 weeks  PLANNED INTERVENTIONS: Therapeutic exercises, Therapeutic activity, Neuromuscular re-education, Gait training, Patient/Family education, Self Care, Stair training, Electrical stimulation, Cryotherapy, Moist heat, and Manual therapy  PLAN FOR NEXT SESSION: Nustep, Gait training, progress per anterior total hip replacement protocol per patient tolerance.   Newman Pies, PTA 10/18/2022, 4:54 PM

## 2022-10-20 ENCOUNTER — Ambulatory Visit: Payer: Medicare Other

## 2022-10-20 DIAGNOSIS — M25551 Pain in right hip: Secondary | ICD-10-CM

## 2022-10-20 DIAGNOSIS — M25651 Stiffness of right hip, not elsewhere classified: Secondary | ICD-10-CM

## 2022-10-20 NOTE — Therapy (Signed)
OUTPATIENT PHYSICAL THERAPY LOWER EXTREMITY TREATMENT   Patient Name: Stephen Lawson MRN: [id number] DOB:10-05-1950, 72 y.o., male Today's Date: 10/20/2022  END OF SESSION:  PT End of Session - 10/20/22 1518     Visit Number 9    Number of Visits 12    Date for PT Re-Evaluation 12/19/22    Authorization Type FOTO    PT Start Time 1515    PT Stop Time 1559    PT Time Calculation (min) 44 min    Activity Tolerance Patient tolerated treatment well    Behavior During Therapy WFL for tasks assessed/performed             Past Medical History:  Diagnosis Date   Arthritis    Back pain    Cancer (HCC)    skin cancer removed from the face   Diabetes mellitus without complication (HCC)    Hypertension    Hypothyroidism    Migraine    Past Surgical History:  Procedure Laterality Date   ARTHROSCOPY WITH ANTERIOR CRUCIATE LIGAMENT (ACL) REPAIR WITH ANTERIOR TIBILIAS GRAFT Right    BACK SURGERY     x 4   ELBOW SURGERY Bilateral    SHOULDER SURGERY Right    arthroscopy   TESTICLE SURGERY     TOTAL HIP ARTHROPLASTY Right 09/12/2022   Procedure: TOTAL HIP ARTHROPLASTY ANTERIOR APPROACH;  Surgeon: Jodi Geralds, MD;  Location: [facility];  Service: Orthopedics;  Laterality: Right;   WRIST SURGERY Right    carpal tunnel   Patient Active Problem List   Diagnosis Date Noted   Primary osteoarthritis of right hip 09/12/2022   S/P total hip arthroplasty 09/12/2022   Diabetes mellitus treated with oral medication (HCC) 07/08/2022   Hearing loss of both ears due to cerumen impaction 05/11/2017   Migraine headache 11/22/2016   Hypothyroid 11/22/2016   T2DM (type 2 diabetes mellitus) (HCC) 10/02/2015   Hypertension associated with diabetes (HCC) 10/02/2015   Chronic back pain 06/29/2015   Allergic rhinitis 02/25/2013   Dysphagia 01/22/2013   Fatty liver 12/06/2012   H/O asbestos exposure 12/06/2012   Edema 12/06/2012    REFERRING PROVIDER: Jodi Geralds MD  REFERRING DIAG: S/p anterior  total hip replacement.  THERAPY DIAG:  Pain in right hip  Stiffness of right hip, not elsewhere classified  Rationale for Evaluation and Treatment: Rehabilitation  ONSET DATE: Ongoing, 09/12/22 (surgery date).  SUBJECTIVE:   SUBJECTIVE STATEMENT: Pt denies any pain today.    PERTINENT HISTORY: Please see above. PAIN:  Are you having pain? No  PRECAUTIONS: Anterior hip.  No ultrasound.  WEIGHT BEARING RESTRICTIONS: No  FALLS:  Has patient fallen in last 6 months? No  LIVING ENVIRONMENT: Lives with: lives with their spouse Lives in: House/apartment Stairs:  6 stairs into home with railing.  Patient performs in a non-reciprocating fashion. Has following equipment at home: Single point cane  OCCUPATION: Retired.  PLOF: Independent with basic ADLs and Independent with household mobility with device  PATIENT GOALS: Do more without right hip pain.   OBJECTIVE:   PATIENT SURVEYS:  FOTO 23.  PALPATION: C/o diffuse right hip pain currently.  LOWER EXTREMITY ROM:  In supine:  Right hip flexion (assisted) to 65 degrees.  He demonstrates -10 degrees of right knee extension via a short arc quad.  LOWER EXTREMITY MMT:  Not accurately assessed due to high pain-level.  However, he was easily able to perform a right SAQ.  GAIT: Slow and cautious gait pattern with a straight cane  on right with a decrease in step length and a tendency to external rotate.  OBSERVATION:    Aquacel intact over patient's right anterior thigh.  TRANSITIONS:  Sit to stand with armrest and sit to supine to sit with supervision only.  TODAY'S TREATMENT SESSION:    10/20/22:                                   EXERCISE LOG  Exercise Repetitions and Resistance Comments  Nustep Level 5 x 15 minutes   Rockerboard 5 minutes   Knee ext 20# x 3 minutes   Ham curls 50# x 3 minutes   Leg Press 2 plates; seat 8; 3.5 mins   Standing hip abd 3# x 20 reps RLE   Standing Marches Airex x 3.5 mins      10/11/22:                                   EXERCISE LOG  Exercise Repetitions and Resistance Comments  Nustep  Level 4 x 15 minutes   Rockerboard 4 minutes   Knee ext  10# x 3 minutes   Ham curls 40# x 3 minutes   Seated abd Blue theraband x 3 minutes   Resisted 4 way walking Blue XTS band 1 minute each direction.     PATIENT EDUCATION:  Education details: Discussed his current HEP that he was instructed in the hospital. Person educated: Patient Education method: Explanation Education comprehension: verbalized understanding  HOME EXERCISE PROGRAM:   ASSESSMENT:  CLINICAL IMPRESSION: Pt arrives for today's treatment session denying any pain.  Pt able to tolerate increased weight with cybex knee flexion and extension with mild fatigue noted, but no discomfort.  Pt introduced to cybex leg press today, with cues required to avoid fully extending BLEs.  Pt able to meet both his ambulation and ADL based goal today.  Pt reported 2/10 right hip pain at completion of today's treatment session.  OBJECTIVE IMPAIRMENTS: Abnormal gait, decreased activity tolerance, decreased mobility, difficulty walking, decreased ROM, decreased strength, and pain.   ACTIVITY LIMITATIONS: carrying, lifting, bending, standing, and locomotion level  PARTICIPATION LIMITATIONS: meal prep, cleaning, and laundry  PERSONAL FACTORS: Time since onset of injury/illness/exacerbation are also affecting patient's functional outcome.   REHAB POTENTIAL: Good  CLINICAL DECISION MAKING: Stable/uncomplicated  EVALUATION COMPLEXITY: Low   GOALS:  SHORT TERM GOALS: Target date: 10/04/22  Ind with an initial HEP. Goal status: MET   LONG TERM GOALS: Target date: 12/19/22.  Ind with an advanced HEP.  Goal status: IN PROGRESS  2.  Perform a reciprocating stair pain with one railing.  Goal status: IN PROGRESS  3.  Increase right hip strength to a solid 4+/5 to increase stability for functional tasks.  Goal  status: IN PROGRESS  4.  Patient walk a community distance with right hip pain not > 3/10.  Goal status: MET  5.  Perform ADL's with right hip pain not > 3-4/10.  Goal status: MET  PLAN:  PT FREQUENCY: 2x/week  PT DURATION: 6 weeks  PLANNED INTERVENTIONS: Therapeutic exercises, Therapeutic activity, Neuromuscular re-education, Gait training, Patient/Family education, Self Care, Stair training, Electrical stimulation, Cryotherapy, Moist heat, and Manual therapy  PLAN FOR NEXT SESSION: Nustep, Gait training, progress per anterior total hip replacement protocol per patient tolerance.   Newman Pies, PTA 10/20/2022, 3:59 PM

## 2022-10-24 ENCOUNTER — Ambulatory Visit: Payer: Medicare Other | Admitting: Physical Therapy

## 2022-10-24 DIAGNOSIS — M25651 Stiffness of right hip, not elsewhere classified: Secondary | ICD-10-CM

## 2022-10-24 DIAGNOSIS — M25551 Pain in right hip: Secondary | ICD-10-CM | POA: Diagnosis not present

## 2022-10-24 DIAGNOSIS — M545 Low back pain, unspecified: Secondary | ICD-10-CM | POA: Diagnosis not present

## 2022-10-24 NOTE — Therapy (Signed)
OUTPATIENT PHYSICAL THERAPY LOWER EXTREMITY TREATMENT   Patient Name: Stephen Lawson MRN: [id number] DOB:23-Mar-1950, 72 y.o., male Today's Date: 10/24/2022  END OF SESSION:  PT End of Session - 10/24/22 1310     Visit Number 10    Number of Visits 12    Date for PT Re-Evaluation 12/19/22    Authorization Type FOTO    PT Start Time 1252    Activity Tolerance Patient tolerated treatment well    Behavior During Therapy [facility] for tasks assessed/performed             Past Medical History:  Diagnosis Date   Arthritis    Back pain    Cancer (HCC)    skin cancer removed from the face   Diabetes mellitus without complication (HCC)    Hypertension    Hypothyroidism    Migraine    Past Surgical History:  Procedure Laterality Date   ARTHROSCOPY WITH ANTERIOR CRUCIATE LIGAMENT (ACL) REPAIR WITH ANTERIOR TIBILIAS GRAFT Right    BACK SURGERY     x 4   ELBOW SURGERY Bilateral    SHOULDER SURGERY Right    arthroscopy   TESTICLE SURGERY     TOTAL HIP ARTHROPLASTY Right 09/12/2022   Procedure: TOTAL HIP ARTHROPLASTY ANTERIOR APPROACH;  Surgeon: Jodi Geralds, MD;  Location: [facility];  Service: Orthopedics;  Laterality: Right;   WRIST SURGERY Right    carpal tunnel   Patient Active Problem List   Diagnosis Date Noted   Primary osteoarthritis of right hip 09/12/2022   S/P total hip arthroplasty 09/12/2022   Diabetes mellitus treated with oral medication (HCC) 07/08/2022   Hearing loss of both ears due to cerumen impaction 05/11/2017   Migraine headache 11/22/2016   Hypothyroid 11/22/2016   T2DM (type 2 diabetes mellitus) (HCC) 10/02/2015   Hypertension associated with diabetes (HCC) 10/02/2015   Chronic back pain 06/29/2015   Allergic rhinitis 02/25/2013   Dysphagia 01/22/2013   Fatty liver 12/06/2012   H/O asbestos exposure 12/06/2012   Edema 12/06/2012    REFERRING PROVIDER: Jodi Geralds MD  REFERRING DIAG: S/p anterior total hip replacement.  THERAPY DIAG:  Pain in right  hip  Stiffness of right hip, not elsewhere classified  Rationale for Evaluation and Treatment: Rehabilitation  ONSET DATE: Ongoing, 09/12/22 (surgery date).  SUBJECTIVE:   SUBJECTIVE STATEMENT: Pt denies any pain today.    PERTINENT HISTORY: Please see above. PAIN:  Are you having pain? No  PRECAUTIONS: Anterior hip.  No ultrasound.  WEIGHT BEARING RESTRICTIONS: No  FALLS:  Has patient fallen in last 6 months? No  LIVING ENVIRONMENT: Lives with: lives with their spouse Lives in: House/apartment Stairs:  6 stairs into home with railing.  Patient performs in a non-reciprocating fashion. Has following equipment at home: Single point cane  OCCUPATION: Retired.  PLOF: Independent with basic ADLs and Independent with household mobility with device  PATIENT GOALS: Do more without right hip pain.   OBJECTIVE:   PATIENT SURVEYS:  FOTO 23.  PALPATION: C/o diffuse right hip pain currently.  LOWER EXTREMITY ROM:  In supine:  Right hip flexion (assisted) to 65 degrees.  He demonstrates -10 degrees of right knee extension via a short arc quad.  LOWER EXTREMITY MMT:  Not accurately assessed due to high pain-level.  However, he was easily able to perform a right SAQ.  GAIT: Slow and cautious gait pattern with a straight cane on right with a decrease in step length and a tendency to external rotate.  OBSERVATION:  Aquacel intact over patient's right anterior thigh.  TRANSITIONS:  Sit to stand with armrest and sit to supine to sit with supervision only.  TODAY'S TREATMENT SESSION:    10/24/22:                                       EXERCISE LOG  Exercise Repetitions and Resistance Comments  Nustep Level x 15 minutes   Knee ext 20# x 3 minutes   Ham curls 40# x 3 minutes   Leg Press 2 plates x 3 minutes   Seated hip flexion 3# to fatigue x 2   Rockerboard 5 minutes      10/20/22:                                   EXERCISE LOG  Exercise Repetitions and  Resistance Comments  Nustep Level 5 x 15 minutes   Rockerboard 5 minutes   Knee ext 20# x 3 minutes   Ham curls 50# x 3 minutes   Leg Press 2 plates; seat 8; 3.5 mins   Standing hip abd 3# x 20 reps RLE   Standing Marches Airex x 3.5 mins        PATIENT EDUCATION:  Education details: Discussed his current HEP that he was instructed in the hospital. Person educated: Patient Education method: Explanation Education comprehension: verbalized understanding  HOME EXERCISE PROGRAM:   ASSESSMENT:  CLINICAL IMPRESSION: Patient doing very well and reporting no right hip pain upon presentation to the clinic today.  He is walking without assistive device.  He has made very good progress toward goals.  OBJECTIVE IMPAIRMENTS: Abnormal gait, decreased activity tolerance, decreased mobility, difficulty walking, decreased ROM, decreased strength, and pain.   ACTIVITY LIMITATIONS: carrying, lifting, bending, standing, and locomotion level  PARTICIPATION LIMITATIONS: meal prep, cleaning, and laundry  PERSONAL FACTORS: Time since onset of injury/illness/exacerbation are also affecting patient's functional outcome.   REHAB POTENTIAL: Good  CLINICAL DECISION MAKING: Stable/uncomplicated  EVALUATION COMPLEXITY: Low   GOALS:  SHORT TERM GOALS: Target date: 10/04/22  Ind with an initial HEP. Goal status: MET   LONG TERM GOALS: Target date: 12/19/22.  Ind with an advanced HEP.  Goal status: IN PROGRESS  2.  Perform a reciprocating stair pain with one railing.  Goal status: IN PROGRESS  3.  Increase right hip strength to a solid 4+/5 to increase stability for functional tasks.  Goal status: MET.  4.  Patient walk a community distance with right hip pain not > 3/10.  Goal status: MET  5.  Perform ADL's with right hip pain not > 3-4/10.  Goal status: MET  PLAN:  PT FREQUENCY: 2x/week  PT DURATION: 6 weeks  PLANNED INTERVENTIONS: Therapeutic exercises, Therapeutic activity,  Neuromuscular re-education, Gait training, Patient/Family education, Self Care, Stair training, Electrical stimulation, Cryotherapy, Moist heat, and Manual therapy  PLAN FOR NEXT SESSION: Nustep, Gait training, progress per anterior total hip replacement protocol per patient tolerance.   Elloise Roark, [place], PT 10/24/2022, 1:38 PM

## 2022-10-25 DIAGNOSIS — M65342 Trigger finger, left ring finger: Secondary | ICD-10-CM | POA: Diagnosis not present

## 2022-10-25 DIAGNOSIS — M654 Radial styloid tenosynovitis [de Quervain]: Secondary | ICD-10-CM | POA: Diagnosis not present

## 2022-11-29 DIAGNOSIS — M25551 Pain in right hip: Secondary | ICD-10-CM | POA: Diagnosis not present

## 2022-12-19 DIAGNOSIS — M545 Low back pain, unspecified: Secondary | ICD-10-CM | POA: Diagnosis not present

## 2023-01-16 ENCOUNTER — Ambulatory Visit (INDEPENDENT_AMBULATORY_CARE_PROVIDER_SITE_OTHER): Payer: Medicare Other | Admitting: Family Medicine

## 2023-01-16 ENCOUNTER — Encounter: Payer: Self-pay | Admitting: Family Medicine

## 2023-01-16 VITALS — BP 133/64 | HR 66 | Ht 71.0 in | Wt 242.0 lb

## 2023-01-16 DIAGNOSIS — Z125 Encounter for screening for malignant neoplasm of prostate: Secondary | ICD-10-CM

## 2023-01-16 DIAGNOSIS — E119 Type 2 diabetes mellitus without complications: Secondary | ICD-10-CM | POA: Diagnosis not present

## 2023-01-16 DIAGNOSIS — E039 Hypothyroidism, unspecified: Secondary | ICD-10-CM

## 2023-01-16 DIAGNOSIS — I152 Hypertension secondary to endocrine disorders: Secondary | ICD-10-CM | POA: Diagnosis not present

## 2023-01-16 DIAGNOSIS — Z7984 Long term (current) use of oral hypoglycemic drugs: Secondary | ICD-10-CM | POA: Diagnosis not present

## 2023-01-16 DIAGNOSIS — E1159 Type 2 diabetes mellitus with other circulatory complications: Secondary | ICD-10-CM | POA: Diagnosis not present

## 2023-01-16 DIAGNOSIS — I10 Essential (primary) hypertension: Secondary | ICD-10-CM

## 2023-01-16 DIAGNOSIS — E1142 Type 2 diabetes mellitus with diabetic polyneuropathy: Secondary | ICD-10-CM

## 2023-01-16 DIAGNOSIS — R351 Nocturia: Secondary | ICD-10-CM

## 2023-01-16 LAB — BAYER DCA HB A1C WAIVED: HB A1C (BAYER DCA - WAIVED): 6.8 % — ABNORMAL HIGH (ref 4.8–5.6)

## 2023-01-16 LAB — LIPID PANEL

## 2023-01-16 MED ORDER — AMLODIPINE BESYLATE 10 MG PO TABS: 10.0000 mg | ORAL_TABLET | Freq: Every day | ORAL | 3 refills | Status: DC

## 2023-01-16 MED ORDER — LEVOTHYROXINE SODIUM 112 MCG PO TABS: 112.0000 ug | ORAL_TABLET | Freq: Every day | ORAL | 3 refills | Status: DC

## 2023-01-16 MED ORDER — ATENOLOL 100 MG PO TABS: 100.0000 mg | ORAL_TABLET | Freq: Every day | ORAL | 3 refills | Status: DC

## 2023-01-16 MED ORDER — METFORMIN HCL 500 MG PO TABS: 1000.0000 mg | ORAL_TABLET | Freq: Two times a day (BID) | ORAL | 3 refills | Status: DC

## 2023-01-16 NOTE — Progress Notes (Signed)
 BP 133/64   Pulse 66   Ht 5' 11 (1.803 m)   Wt 242 lb (109.8 kg)   SpO2 97%   BMI 33.75 kg/m    Subjective:   Patient ID: Stephen Lawson, male    DOB: 1950/03/01, 72 y.o.   MRN: [id number]  HPI: Stephen Lawson is a 73 y.o. male presenting on 01/16/2023 for Medical Management of Chronic Issues, Hypertension, and Diabetes   HPI Type 2 diabetes mellitus Patient comes in today for recheck of his diabetes. Patient has been currently taking glimepiride  and metformin . Patient is currently on an ACE inhibitor/ARB. Patient has not seen an ophthalmologist this year. Patient denies any new issues with their feet. The symptom started onset as an adult hypertension ARE RELATED TO DM   Hypertension Patient is currently on amlodipine  and atenolol  and lisinopril , and their blood pressure today is 153/83. Patient denies any lightheadedness or dizziness. Patient denies headaches, blurred vision, chest pains, shortness of breath, or weakness. Denies any side effects from medication and is content with current medication.   Hypothyroidism recheck Patient is coming in for thyroid  recheck today as well. They deny any issues with hair changes or heat or cold problems or diarrhea or constipation. They deny any chest pain or palpitations. They are currently on levothyroxine  112 micrograms   Relevant past medical, surgical, family and social history reviewed and updated as indicated. Interim medical history since our last visit reviewed. Allergies and medications reviewed and updated.  Review of Systems  Constitutional:  Negative for chills and fever.  Eyes:  Negative for visual disturbance.  Respiratory:  Negative for shortness of breath and wheezing.   Cardiovascular:  Negative for chest pain and leg swelling.  Musculoskeletal:  Positive for back pain.  Skin:  Negative for rash.  Neurological:  Negative for dizziness and light-headedness.  All other systems reviewed and are negative.   Per HPI unless  specifically indicated above   Allergies as of 01/16/2023       Reactions   Darvocet [propoxyphene N-acetaminophen ] Itching      Talwin [pentazocine] Itching   Ultram [tramadol]         Medication List        Accurate as of January 16, 2023  3:30 PM. If you have any questions, ask your nurse or doctor.          STOP taking these medications    aspirin  EC 325 MG tablet Stopped by: Fonda LABOR Bradshaw Minihan       TAKE these medications    amLODipine  10 MG tablet Commonly known as: NORVASC  Take 1 tablet (10 mg total) by mouth daily.   atenolol  100 MG tablet Commonly known as: TENORMIN  Take 1 tablet (100 mg total) by mouth daily.   blood glucose meter kit and supplies Kit Dispense based on patient and insurance preference. Use up to four times daily as directed. (FOR ICD-9 250.00, 250.01).   celecoxib  200 MG capsule Commonly known as: CeleBREX  Take 1 capsule (200 mg total) by mouth 2 (two) times daily.   glimepiride  4 MG tablet Commonly known as: AMARYL  Take 1 tablet (4 mg total) by mouth daily before breakfast.   hydrocortisone 2.5 % cream Apply 1 Application topically 2 (two) times daily as needed (dermatitis).   levothyroxine  112 MCG tablet Commonly known as: SYNTHROID  Take 1 tablet (112 mcg total) by mouth daily.   lisinopril  20 MG tablet Commonly known as: ZESTRIL  Take 1 tablet (20 mg total) by mouth  daily.   metFORMIN  500 MG tablet Commonly known as: GLUCOPHAGE  Take 2 tablets (1,000 mg total) by mouth 2 (two) times daily with a meal.   methadone  10 MG tablet Commonly known as: DOLOPHINE  Take 10 mg by mouth 2 (two) times daily.   tiZANidine  2 MG tablet Commonly known as: ZANAFLEX  Take 1 tablet (2 mg total) by mouth every 8 (eight) hours as needed for muscle spasms.         Objective:   BP 133/64   Pulse 66   Ht 5' 11 (1.803 m)   Wt 242 lb (109.8 kg)   SpO2 97%   BMI 33.75 kg/m   Wt Readings from Last 3 Encounters:  01/16/23 242 lb  (109.8 kg)  10/13/22 240 lb (108.9 kg)  09/12/22 236 lb (107 kg)    Physical Exam Vitals and nursing note reviewed.  Constitutional:      General: He is not in acute distress.    Appearance: He is well-developed. He is not diaphoretic.  Eyes:     General: No scleral icterus.    Conjunctiva/sclera: Conjunctivae normal.  Neck:     Thyroid : No thyromegaly.  Cardiovascular:     Rate and Rhythm: Normal rate and regular rhythm.     Heart sounds: Normal heart sounds. No murmur heard. Pulmonary:     Effort: Pulmonary effort is normal. No respiratory distress.     Breath sounds: Normal breath sounds. No wheezing.  Musculoskeletal:        General: Normal range of motion.     Cervical back: Neck supple.  Lymphadenopathy:     Cervical: No cervical adenopathy.  Skin:    General: Skin is warm and dry.     Findings: No rash.  Neurological:     Mental Status: He is alert and oriented to person, place, and time.     Coordination: Coordination normal.  Psychiatric:        Behavior: Behavior normal.       Assessment & Plan:   Problem List Items Addressed This Visit       Cardiovascular and Mediastinum   Hypertension associated with diabetes (HCC) - Primary   Relevant Medications   amLODipine  (NORVASC ) 10 MG tablet   atenolol  (TENORMIN ) 100 MG tablet   metFORMIN  (GLUCOPHAGE ) 500 MG tablet   Other Relevant Orders   CBC with Differential/Platelet   CMP14+EGFR   Lipid panel   TSH   Bayer DCA Hb A1c Waived     Endocrine   T2DM (type 2 diabetes mellitus) (HCC)   Relevant Medications   metFORMIN  (GLUCOPHAGE ) 500 MG tablet   Other Relevant Orders   CBC with Differential/Platelet   CMP14+EGFR   Lipid panel   TSH   Bayer DCA Hb A1c Waived   Hypothyroid   Relevant Medications   atenolol  (TENORMIN ) 100 MG tablet   levothyroxine  (SYNTHROID ) 112 MCG tablet   Other Relevant Orders   CBC with Differential/Platelet   CMP14+EGFR   Lipid panel   TSH   Bayer DCA Hb A1c Waived    Diabetes mellitus treated with oral medication (HCC)   Relevant Medications   metFORMIN  (GLUCOPHAGE ) 500 MG tablet   Other Relevant Orders   CBC with Differential/Platelet   CMP14+EGFR   Lipid panel   TSH   Bayer DCA Hb A1c Waived   Other Visit Diagnoses       Prostate cancer screening         Essential hypertension  Relevant Medications   amLODipine  (NORVASC ) 10 MG tablet   atenolol  (TENORMIN ) 100 MG tablet     Nocturia       Relevant Orders   PSA, total and free       A1c 6.8, blood pressure looks better on recheck at 133.  Continue to monitor.  No changes.  Follow up plan: Return in about 3 months (around 04/16/2023), or if symptoms worsen or fail to improve, for Diabetes and hypertension.  Counseling provided for all of the vaccine components Orders Placed This Encounter  Procedures   CBC with Differential/Platelet   CMP14+EGFR   Lipid panel   TSH   Bayer DCA Hb A1c Waived   PSA, total and free    Fonda Levins, MD [facility] 01/16/2023, 3:30 PM

## 2023-01-17 LAB — LIPID PANEL
Cholesterol, Total: 146 mg/dL (ref 100–199)
HDL: 37 mg/dL — ABNORMAL LOW (ref >39)
LDL CALC COMMENT:: 3.9 ratio (ref 0.0–5.0)
LDL Chol Calc (NIH): 89 mg/dL (ref 0–99)
Triglycerides: 111 mg/dL (ref 0–149)
VLDL Cholesterol Cal: 20 mg/dL (ref 5–40)

## 2023-01-17 LAB — CBC WITH DIFFERENTIAL/PLATELET
Basophils Absolute: 0.1 10*3/uL (ref 0.0–0.2)
Basos: 1 %
EOS (ABSOLUTE): 0.2 10*3/uL (ref 0.0–0.4)
Eos: 3 %
Hematocrit: 46 % (ref 37.5–51.0)
Hemoglobin: 14.6 g/dL (ref 13.0–17.7)
Immature Grans (Abs): 0 10*3/uL (ref 0.0–0.1)
Immature Granulocytes: 0 %
Lymphocytes Absolute: 1.9 10*3/uL (ref 0.7–3.1)
Lymphs: 25 %
MCH: 26 pg — ABNORMAL LOW (ref 26.6–33.0)
MCHC: 31.7 g/dL (ref 31.5–35.7)
MCV: 82 fL (ref 79–97)
Monocytes Absolute: 0.6 10*3/uL (ref 0.1–0.9)
Monocytes: 8 %
Neutrophils Absolute: 5 10*3/uL (ref 1.4–7.0)
Neutrophils: 63 %
Platelets: 308 10*3/uL (ref 150–450)
RBC: 5.61 x10E6/uL (ref 4.14–5.80)
RDW: 14.5 % (ref 11.6–15.4)
WBC: 7.9 10*3/uL (ref 3.4–10.8)

## 2023-01-17 LAB — CMP14+EGFR
ALT: 16 IU/L (ref 0–44)
AST: 15 IU/L (ref 0–40)
Albumin: 4.3 g/dL (ref 3.8–4.8)
Alkaline Phosphatase: 106 IU/L (ref 44–121)
BUN/Creatinine Ratio: 11 (ref 10–24)
BUN: 12 mg/dL (ref 8–27)
CO2: 25 mmol/L (ref 20–29)
Calcium: 9.8 mg/dL (ref 8.6–10.2)
Chloride: 103 mmol/L (ref 96–106)
Creatinine, Ser: 1.11 mg/dL (ref 0.76–1.27)
Globulin, Total: 2.5 g/dL (ref 1.5–4.5)
Glucose: 82 mg/dL (ref 70–99)
Potassium: 5.2 mmol/L (ref 3.5–5.2)
Sodium: 143 mmol/L (ref 134–144)
Total Protein: 6.8 g/dL (ref 6.0–8.5)
eGFR: 71 mL/min/{1.73_m2} (ref >59)

## 2023-01-17 LAB — PSA, TOTAL AND FREE
PSA, Free Pct: 60 %
PSA, Free: 0.18 ng/mL
Prostate Specific Ag, Serum: 0.3 ng/mL (ref 0.0–4.0)

## 2023-01-17 LAB — TSH: TSH: 2.52 u[IU]/mL (ref 0.450–4.500)

## 2023-01-22 ENCOUNTER — Other Ambulatory Visit: Payer: Self-pay | Admitting: Family Medicine

## 2023-01-22 DIAGNOSIS — I10 Essential (primary) hypertension: Secondary | ICD-10-CM

## 2023-01-22 DIAGNOSIS — E1159 Type 2 diabetes mellitus with other circulatory complications: Secondary | ICD-10-CM

## 2023-01-22 DIAGNOSIS — E1142 Type 2 diabetes mellitus with diabetic polyneuropathy: Secondary | ICD-10-CM

## 2023-01-26 DIAGNOSIS — M654 Radial styloid tenosynovitis [de Quervain]: Secondary | ICD-10-CM | POA: Diagnosis not present

## 2023-02-06 ENCOUNTER — Other Ambulatory Visit: Payer: Self-pay | Admitting: Family Medicine

## 2023-02-06 DIAGNOSIS — E1142 Type 2 diabetes mellitus with diabetic polyneuropathy: Secondary | ICD-10-CM

## 2023-02-06 DIAGNOSIS — I10 Essential (primary) hypertension: Secondary | ICD-10-CM

## 2023-02-06 DIAGNOSIS — E1159 Type 2 diabetes mellitus with other circulatory complications: Secondary | ICD-10-CM

## 2023-02-06 MED ORDER — METFORMIN HCL 500 MG PO TABS: 1000.0000 mg | ORAL_TABLET | Freq: Two times a day (BID) | ORAL | 2 refills | Status: DC

## 2023-02-06 MED ORDER — LEVOTHYROXINE SODIUM 112 MCG PO TABS: 112.0000 ug | ORAL_TABLET | Freq: Every day | ORAL | 2 refills | Status: DC

## 2023-02-06 MED ORDER — AMLODIPINE BESYLATE 10 MG PO TABS: 10.0000 mg | ORAL_TABLET | Freq: Every day | ORAL | 2 refills | Status: DC

## 2023-02-06 MED ORDER — ATENOLOL 100 MG PO TABS: 100.0000 mg | ORAL_TABLET | Freq: Every day | ORAL | 2 refills | Status: DC

## 2023-02-06 NOTE — Telephone Encounter (Signed)
Copied from CRM [id number]. Topic: Clinical - Medication Refill >> Feb 06, 2023  8:26 AM Gildardo Pounds wrote: Most Recent Primary Care Visit:  Provider: [facility]  Department: [facility]  Visit Type: OFFICE VISIT  Date: 01/16/2023  Medication: amLODipine (NORVASC) 10 MG tablet atenolol (TENORMIN) 100 MG tablet levothyroxine (SYNTHROID) 112 MCG tablet metFORMIN (GLUCOPHAGE) 500 MG tablet  Has the patient contacted their pharmacy? Yes (Agent: If no, request that the patient contact the pharmacy for the refill. If patient does not wish to contact the pharmacy document the reason why and proceed with request.) (Agent: If yes, when and what did the pharmacy advise?)prescription called into wrong pharmacy  Is this the correct pharmacy for this prescription? Yes If no, delete pharmacy and type the correct one.  This is the patient's preferred pharmacy:  [facility] - [place], [place] - [address] [place] [address] Phone: [phone] Fax: [phone]     Has the prescription been filled recently? Yes  Is the patient out of the medication? No  Has the patient been seen for an appointment in the last year OR does the patient have an upcoming appointment? Yes  Can we respond through MyChart? Yes  Agent: Please be advised that Rx refills may take up to 3 business days. We ask that you follow-up with your pharmacy.

## 2023-02-06 NOTE — Telephone Encounter (Signed)
Remaining refills sent to mail order pharmacy.

## 2023-02-22 DIAGNOSIS — M65342 Trigger finger, left ring finger: Secondary | ICD-10-CM | POA: Diagnosis not present

## 2023-02-22 DIAGNOSIS — M654 Radial styloid tenosynovitis [de Quervain]: Secondary | ICD-10-CM | POA: Diagnosis not present

## 2023-02-27 DIAGNOSIS — M545 Low back pain, unspecified: Secondary | ICD-10-CM | POA: Diagnosis not present

## 2023-02-27 DIAGNOSIS — M25552 Pain in left hip: Secondary | ICD-10-CM | POA: Diagnosis not present

## 2023-04-24 DIAGNOSIS — M545 Low back pain, unspecified: Secondary | ICD-10-CM | POA: Diagnosis not present

## 2023-04-24 DIAGNOSIS — M25552 Pain in left hip: Secondary | ICD-10-CM | POA: Diagnosis not present

## 2023-04-26 ENCOUNTER — Ambulatory Visit (INDEPENDENT_AMBULATORY_CARE_PROVIDER_SITE_OTHER): Payer: Medicare Other | Admitting: Family Medicine

## 2023-04-26 ENCOUNTER — Encounter: Payer: Self-pay | Admitting: Family Medicine

## 2023-04-26 VITALS — BP 135/84 | HR 62 | Ht 71.0 in | Wt 240.0 lb

## 2023-04-26 DIAGNOSIS — E119 Type 2 diabetes mellitus without complications: Secondary | ICD-10-CM

## 2023-04-26 DIAGNOSIS — E1142 Type 2 diabetes mellitus with diabetic polyneuropathy: Secondary | ICD-10-CM

## 2023-04-26 DIAGNOSIS — I152 Hypertension secondary to endocrine disorders: Secondary | ICD-10-CM | POA: Diagnosis not present

## 2023-04-26 DIAGNOSIS — E1159 Type 2 diabetes mellitus with other circulatory complications: Secondary | ICD-10-CM | POA: Diagnosis not present

## 2023-04-26 DIAGNOSIS — E039 Hypothyroidism, unspecified: Secondary | ICD-10-CM

## 2023-04-26 DIAGNOSIS — Z7984 Long term (current) use of oral hypoglycemic drugs: Secondary | ICD-10-CM | POA: Diagnosis not present

## 2023-04-26 LAB — BAYER DCA HB A1C WAIVED: HB A1C (BAYER DCA - WAIVED): 7.1 % — ABNORMAL HIGH (ref 4.8–5.6)

## 2023-04-26 MED ORDER — LISINOPRIL 20 MG PO TABS
20.0000 mg | ORAL_TABLET | Freq: Every day | ORAL | 3 refills | Status: AC
Start: 2023-04-26 — End: ?

## 2023-04-26 MED ORDER — GLIMEPIRIDE 4 MG PO TABS: 4.0000 mg | ORAL_TABLET | Freq: Every day | ORAL | 3 refills | Status: AC

## 2023-04-26 NOTE — Progress Notes (Signed)
 BP 135/84   Pulse 62   Ht 5\' 11"  (1.803 m)   Wt 240 lb (108.9 kg)   SpO2 94%   BMI 33.47 kg/m    Subjective:   Patient ID: Stephen Lawson, male    DOB: 1950-08-07, 73 y.o.   MRN: [id number]  HPI: Stephen Lawson is a 73 y.o. male presenting on 04/26/2023 for Medical Management of Chronic Issues, Diabetes, and Hypertension   HPI Type 2 diabetes mellitus Patient comes in today for recheck of his diabetes. Patient has been currently taking metformin  and glimepiride . Patient is currently on an ACE inhibitor/ARB. Patient has not seen an ophthalmologist this year. Patient denies any new issues with their feet. The symptom started onset as an adult hypertension and hypothyroidism ARE RELATED TO DM   Hypertension Patient is currently on amlodipine  and atenolol  and lisinopril , and their blood pressure today is 157/70. Patient denies any lightheadedness or dizziness. Patient denies headaches, blurred vision, chest pains, shortness of breath, or weakness. Denies any side effects from medication and is content with current medication.   Hypothyroidism recheck Patient is coming in for thyroid  recheck today as well. They deny any issues with hair changes or heat or cold problems or diarrhea or constipation. They deny any chest pain or palpitations. They are currently on levothyroxine  112 micrograms   Relevant past medical, surgical, family and social history reviewed and updated as indicated. Interim medical history since our last visit reviewed. Allergies and medications reviewed and updated.  Review of Systems  Constitutional:  Negative for chills and fever.  Eyes:  Negative for visual disturbance.  Respiratory:  Negative for shortness of breath and wheezing.   Cardiovascular:  Negative for chest pain and leg swelling.  Musculoskeletal:  Negative for back pain and gait problem.  Skin:  Negative for rash.  Neurological:  Negative for dizziness and light-headedness.  All other systems reviewed and  are negative.   Per HPI unless specifically indicated above   Allergies as of 04/26/2023       Reactions   Darvocet [propoxyphene N-acetaminophen ] Itching      Talwin [pentazocine] Itching   Ultram [tramadol]         Medication List        Accurate as of April 26, 2023 11:23 AM. If you have any questions, ask your nurse or doctor.          amLODipine  10 MG tablet Commonly known as: NORVASC  Take 1 tablet (10 mg total) by mouth daily.   atenolol  100 MG tablet Commonly known as: TENORMIN  Take 1 tablet (100 mg total) by mouth daily.   blood glucose meter kit and supplies Kit Dispense based on patient and insurance preference. Use up to four times daily as directed. (FOR ICD-9 250.00, 250.01).   celecoxib  200 MG capsule Commonly known as: CeleBREX  Take 1 capsule (200 mg total) by mouth 2 (two) times daily.   glimepiride  4 MG tablet Commonly known as: AMARYL  Take 1 tablet (4 mg total) by mouth daily before breakfast.   hydrocortisone 2.5 % cream Apply 1 Application topically 2 (two) times daily as needed (dermatitis).   levothyroxine  112 MCG tablet Commonly known as: SYNTHROID  Take 1 tablet (112 mcg total) by mouth daily.   lisinopril  20 MG tablet Commonly known as: ZESTRIL  Take 1 tablet (20 mg total) by mouth daily.   metFORMIN  500 MG tablet Commonly known as: GLUCOPHAGE  Take 2 tablets (1,000 mg total) by mouth 2 (two) times daily with a  meal.   methadone  10 MG tablet Commonly known as: DOLOPHINE  Take 10 mg by mouth 2 (two) times daily.   tiZANidine  2 MG tablet Commonly known as: ZANAFLEX  Take 1 tablet (2 mg total) by mouth every 8 (eight) hours as needed for muscle spasms.         Objective:   BP 135/84   Pulse 62   Ht 5\' 11"  (1.803 m)   Wt 240 lb (108.9 kg)   SpO2 94%   BMI 33.47 kg/m   Wt Readings from Last 3 Encounters:  04/26/23 240 lb (108.9 kg)  01/16/23 242 lb (109.8 kg)  10/13/22 240 lb (108.9 kg)    Physical Exam Vitals and  nursing note reviewed.  Constitutional:      General: He is not in acute distress.    Appearance: He is well-developed. He is not diaphoretic.  Eyes:     General: No scleral icterus.    Conjunctiva/sclera: Conjunctivae normal.  Neck:     Thyroid : No thyromegaly.  Cardiovascular:     Rate and Rhythm: Normal rate and regular rhythm.     Heart sounds: Normal heart sounds. No murmur heard. Pulmonary:     Effort: Pulmonary effort is normal. No respiratory distress.     Breath sounds: Normal breath sounds. No wheezing.  Musculoskeletal:        General: No swelling. Normal range of motion.     Cervical back: Neck supple.  Lymphadenopathy:     Cervical: No cervical adenopathy.  Skin:    General: Skin is warm and dry.     Findings: No rash.  Neurological:     Mental Status: He is alert and oriented to person, place, and time.     Coordination: Coordination normal.  Psychiatric:        Behavior: Behavior normal.       Assessment & Plan:   Problem List Items Addressed This Visit       Cardiovascular and Mediastinum   Hypertension associated with diabetes (HCC)   Relevant Medications   glimepiride  (AMARYL ) 4 MG tablet   lisinopril  (ZESTRIL ) 20 MG tablet     Endocrine   T2DM (type 2 diabetes mellitus) (HCC)   Relevant Medications   glimepiride  (AMARYL ) 4 MG tablet   lisinopril  (ZESTRIL ) 20 MG tablet   Hypothyroid   Diabetes mellitus treated with oral medication (HCC) - Primary   Relevant Medications   glimepiride  (AMARYL ) 4 MG tablet   lisinopril  (ZESTRIL ) 20 MG tablet   Other Relevant Orders   Bayer DCA Hb A1c Waived   Microalbumin/Creatinine Ratio, Urine    A1c up slightly at 7.1.  Blood pressure up today as well.  Recommend definitely focus on diet and reducing sodium and reducing carbs.  Recheck blood pressure was good at 135/84 so no changes there. Follow up plan: Return in about 3 months (around 07/26/2023), or if symptoms worsen or fail to improve, for  Diabetes and hypertension recheck.  Counseling provided for all of the vaccine components Orders Placed This Encounter  Procedures   Bayer DCA Hb A1c Waived   Microalbumin/Creatinine Ratio, Urine    Jolyne Needs, MD [facility] 04/26/2023, 11:23 AM

## 2023-04-27 LAB — MICROALBUMIN / CREATININE URINE RATIO
Creatinine, Urine: 34.3 mg/dL
Microalb/Creat Ratio: 15 mg/g{creat} (ref 0–29)
Microalbumin, Urine: 5.1 ug/mL

## 2023-05-17 DIAGNOSIS — H6123 Impacted cerumen, bilateral: Secondary | ICD-10-CM | POA: Diagnosis not present

## 2023-06-19 DIAGNOSIS — M545 Low back pain, unspecified: Secondary | ICD-10-CM | POA: Diagnosis not present

## 2023-06-29 DIAGNOSIS — H524 Presbyopia: Secondary | ICD-10-CM | POA: Diagnosis not present

## 2023-06-29 DIAGNOSIS — H25813 Combined forms of age-related cataract, bilateral: Secondary | ICD-10-CM | POA: Diagnosis not present

## 2023-06-29 DIAGNOSIS — H52223 Regular astigmatism, bilateral: Secondary | ICD-10-CM | POA: Diagnosis not present

## 2023-06-29 DIAGNOSIS — E119 Type 2 diabetes mellitus without complications: Secondary | ICD-10-CM | POA: Diagnosis not present

## 2023-06-29 DIAGNOSIS — H5203 Hypermetropia, bilateral: Secondary | ICD-10-CM | POA: Diagnosis not present

## 2023-06-29 LAB — HM DIABETES EYE EXAM

## 2023-07-26 ENCOUNTER — Ambulatory Visit: Admitting: Family Medicine

## 2023-08-14 DIAGNOSIS — M545 Low back pain, unspecified: Secondary | ICD-10-CM | POA: Diagnosis not present

## 2023-08-22 ENCOUNTER — Telehealth: Payer: Self-pay | Admitting: Family Medicine

## 2023-08-22 NOTE — Telephone Encounter (Signed)
 Copied from CRM #[id number]. Topic: Clinical - Medication Question >> Aug 22, 2023  3:27 PM Delon DASEN wrote: Reason for CRM: Lucienne with [facility]- they have sent several faxes regarding statin therapy, need a fax back to [phone]

## 2023-08-22 NOTE — Telephone Encounter (Signed)
 This is a form that our providers do not usually answer, it has been received and placed on PCP's desk

## 2023-08-30 ENCOUNTER — Ambulatory Visit: Admitting: Family Medicine

## 2023-09-06 DIAGNOSIS — L218 Other seborrheic dermatitis: Secondary | ICD-10-CM | POA: Diagnosis not present

## 2023-09-06 DIAGNOSIS — L720 Epidermal cyst: Secondary | ICD-10-CM | POA: Diagnosis not present

## 2023-09-06 DIAGNOSIS — Z85828 Personal history of other malignant neoplasm of skin: Secondary | ICD-10-CM | POA: Diagnosis not present

## 2023-09-13 ENCOUNTER — Other Ambulatory Visit: Payer: Self-pay | Admitting: Family Medicine

## 2023-09-14 ENCOUNTER — Encounter: Payer: Self-pay | Admitting: Family Medicine

## 2023-09-14 ENCOUNTER — Ambulatory Visit (INDEPENDENT_AMBULATORY_CARE_PROVIDER_SITE_OTHER): Admitting: Family Medicine

## 2023-09-14 VITALS — BP 144/75 | HR 58 | Ht 71.0 in | Wt 235.0 lb

## 2023-09-14 DIAGNOSIS — E039 Hypothyroidism, unspecified: Secondary | ICD-10-CM | POA: Diagnosis not present

## 2023-09-14 DIAGNOSIS — Z7984 Long term (current) use of oral hypoglycemic drugs: Secondary | ICD-10-CM

## 2023-09-14 DIAGNOSIS — I152 Hypertension secondary to endocrine disorders: Secondary | ICD-10-CM

## 2023-09-14 DIAGNOSIS — E1159 Type 2 diabetes mellitus with other circulatory complications: Secondary | ICD-10-CM

## 2023-09-14 DIAGNOSIS — E1142 Type 2 diabetes mellitus with diabetic polyneuropathy: Secondary | ICD-10-CM

## 2023-09-14 LAB — BAYER DCA HB A1C WAIVED: HB A1C (BAYER DCA - WAIVED): 7.2 % — ABNORMAL HIGH (ref 4.8–5.6)

## 2023-09-14 NOTE — Progress Notes (Signed)
 BP (!) 144/75   Pulse (!) 58   Ht 5' 11 (1.803 m)   Wt 235 lb (106.6 kg)   SpO2 98%   BMI 32.78 kg/m    Subjective:   Patient ID: Stephen Lawson, male    DOB: 04-20-1950, 73 y.o.   MRN: [id number]  HPI: Stephen Lawson is a 73 y.o. male presenting on 09/14/2023 for Medical Management of Chronic Issues, Diabetes, and Hypertension   Discussed the use of AI scribe software for clinical note transcription with the patient, who gave verbal consent to proceed.  History of Present Illness   Stephen Lawson is a 73 year old male with diabetes and hypertension who presents for a recheck of his conditions.  He does not monitor his blood sugar at home but continues to take glimepiride  and metformin  daily.  His blood pressure was elevated earlier today at 144/157 mmHg but decreased to 144 mmHg upon rechecking. He does not monitor his blood pressure at home and does not have a machine. He is currently taking amlodipine , lisinopril , and atenolol  daily for blood pressure management.  He experienced a three-day episode of severe, throbbing hand pain that kept him awake at night. The pain has since resolved. He has a history of gout and suspects this was a flare-up. He took Aleve for relief.  He confirms taking his thyroid  medication daily without any noted issues or changes.          Relevant past medical, surgical, family and social history reviewed and updated as indicated. Interim medical history since our last visit reviewed. Allergies and medications reviewed and updated.  Review of Systems  Constitutional:  Negative for chills and fever.  Eyes:  Negative for visual disturbance.  Respiratory:  Negative for shortness of breath and wheezing.   Cardiovascular:  Negative for chest pain and leg swelling.  Musculoskeletal:  Positive for arthralgias. Negative for back pain and gait problem.  Skin:  Negative for rash.  Neurological:  Negative for dizziness and light-headedness.  All other systems  reviewed and are negative.   Per HPI unless specifically indicated above   Allergies as of 09/14/2023       Reactions   Darvocet [propoxyphene N-acetaminophen ] Itching      Talwin [pentazocine] Itching   Ultram [tramadol]         Medication List        Accurate as of September 14, 2023  3:17 PM. If you have any questions, ask your nurse or doctor.          amLODipine  10 MG tablet Commonly known as: NORVASC  Take 1 tablet (10 mg total) by mouth daily.   atenolol  100 MG tablet Commonly known as: TENORMIN  Take 1 tablet (100 mg total) by mouth daily.   blood glucose meter kit and supplies Kit Dispense based on patient and insurance preference. Use up to four times daily as directed. (FOR ICD-9 250.00, 250.01).   glimepiride  4 MG tablet Commonly known as: AMARYL  Take 1 tablet (4 mg total) by mouth daily before breakfast.   hydrocortisone 2.5 % cream Apply 1 Application topically 2 (two) times daily as needed (dermatitis).   levothyroxine  112 MCG tablet Commonly known as: SYNTHROID  TAKE 1 TABLET EVERY DAY   lisinopril  20 MG tablet Commonly known as: ZESTRIL  Take 1 tablet (20 mg total) by mouth daily.   metFORMIN  500 MG tablet Commonly known as: GLUCOPHAGE  Take 2 tablets (1,000 mg total) by mouth 2 (two) times daily with a  meal.   methadone  10 MG tablet Commonly known as: DOLOPHINE  Take 10 mg by mouth 2 (two) times daily.   tiZANidine  2 MG tablet Commonly known as: ZANAFLEX  Take 1 tablet (2 mg total) by mouth every 8 (eight) hours as needed for muscle spasms.         Objective:   BP (!) 144/75   Pulse (!) 58   Ht 5' 11 (1.803 m)   Wt 235 lb (106.6 kg)   SpO2 98%   BMI 32.78 kg/m   Wt Readings from Last 3 Encounters:  09/14/23 235 lb (106.6 kg)  04/26/23 240 lb (108.9 kg)  01/16/23 242 lb (109.8 kg)    Physical Exam Physical Exam   NECK: Thyroid  without enlargement or nodules. CHEST: Lungs clear to auscultation. CARDIOVASCULAR: Regular  heart rate and rhythm, no murmurs. EXTREMITIES: Feet without cuts or sores, good pulses, no swelling.         Assessment & Plan:   Problem List Items Addressed This Visit       Cardiovascular and Mediastinum   Hypertension associated with diabetes (HCC) - Primary   Relevant Orders   Bayer DCA Hb A1c Waived   CBC with Differential/Platelet   CMP14+EGFR   Lipid panel     Endocrine   T2DM (type 2 diabetes mellitus) (HCC)   Hypothyroid   Relevant Orders   Bayer DCA Hb A1c Waived   CBC with Differential/Platelet   CMP14+EGFR   Lipid panel   TSH       Type 2 diabetes mellitus with diabetic polyneuropathy Monitoring A1c for glycemic control. - Check A1c level. - Perform foot exam. - Encourage regular foot self-examinations. - A1c 7.2 today, slightly increased from last time, focus on diet.  Hypertension Managed with amlodipine , lisinopril , and atenolol . Initial elevated blood pressure decreased upon recheck. - Encourage obtaining a home blood pressure monitor. - Advise checking blood pressure at pharmacies or during other medical visits. - Continue current antihypertensive medications: amlodipine , lisinopril , atenolol .  Hypothyroidism - Continue current thyroid  medication.  Osteoarthritis  Suspected gout flare, resolved Suspected gout flare in the hand resolved spontaneously. Aleve provided relief. - Advise use of Aleve for future flares.          Follow up plan: Return in about 3 months (around 12/14/2023), or if symptoms worsen or fail to improve, for Diabetes.  Counseling provided for all of the vaccine components Orders Placed This Encounter  Procedures   Bayer DCA Hb A1c Waived   CBC with Differential/Platelet   CMP14+EGFR   Lipid panel   TSH    Fonda Levins, MD [facility] 09/14/2023, 3:17 PM

## 2023-09-15 LAB — CBC WITH DIFFERENTIAL/PLATELET
Basophils Absolute: 0.1 x10E3/uL (ref 0.0–0.2)
Basos: 1 %
EOS (ABSOLUTE): 0.3 x10E3/uL (ref 0.0–0.4)
Eos: 4 %
Hematocrit: 43.1 % (ref 37.5–51.0)
Hemoglobin: 13.8 g/dL (ref 13.0–17.7)
Immature Grans (Abs): 0 x10E3/uL (ref 0.0–0.1)
Immature Granulocytes: 0 %
Lymphocytes Absolute: 2.1 x10E3/uL (ref 0.7–3.1)
Lymphs: 32 %
MCH: 27.2 pg (ref 26.6–33.0)
MCHC: 32 g/dL (ref 31.5–35.7)
MCV: 85 fL (ref 79–97)
Monocytes Absolute: 0.6 x10E3/uL (ref 0.1–0.9)
Monocytes: 9 %
Neutrophils Absolute: 3.7 x10E3/uL (ref 1.4–7.0)
Neutrophils: 54 %
Platelets: 289 x10E3/uL (ref 150–450)
RBC: 5.07 x10E6/uL (ref 4.14–5.80)
RDW: 13 % (ref 11.6–15.4)
WBC: 6.7 x10E3/uL (ref 3.4–10.8)

## 2023-09-15 LAB — CMP14+EGFR
ALT: 17 IU/L (ref 0–44)
AST: 15 IU/L (ref 0–40)
Albumin: 4.4 g/dL (ref 3.8–4.8)
Alkaline Phosphatase: 85 IU/L (ref 44–121)
BUN/Creatinine Ratio: 11 (ref 10–24)
BUN: 12 mg/dL (ref 8–27)
Bilirubin Total: 0.4 mg/dL (ref 0.0–1.2)
CO2: 26 mmol/L (ref 20–29)
Calcium: 9.2 mg/dL (ref 8.6–10.2)
Chloride: 101 mmol/L (ref 96–106)
Creatinine, Ser: 1.07 mg/dL (ref 0.76–1.27)
Globulin, Total: 2.3 g/dL (ref 1.5–4.5)
Glucose: 112 mg/dL — ABNORMAL HIGH (ref 70–99)
Potassium: 5 mmol/L (ref 3.5–5.2)
Sodium: 140 mmol/L (ref 134–144)
Total Protein: 6.7 g/dL (ref 6.0–8.5)
eGFR: 74 mL/min/1.73 (ref >59)

## 2023-09-15 LAB — LIPID PANEL
Chol/HDL Ratio: 3.4 ratio (ref 0.0–5.0)
Cholesterol, Total: 133 mg/dL (ref 100–199)
HDL: 39 mg/dL — ABNORMAL LOW (ref >39)
LDL Chol Calc (NIH): 79 mg/dL (ref 0–99)
Triglycerides: 75 mg/dL (ref 0–149)
VLDL Cholesterol Cal: 15 mg/dL (ref 5–40)

## 2023-09-15 LAB — TSH: TSH: 2.47 u[IU]/mL (ref 0.450–4.500)

## 2023-09-20 ENCOUNTER — Ambulatory Visit: Payer: Self-pay | Admitting: Family Medicine

## 2023-10-16 DIAGNOSIS — M545 Low back pain, unspecified: Secondary | ICD-10-CM | POA: Diagnosis not present

## 2023-11-09 ENCOUNTER — Encounter: Payer: Self-pay | Admitting: *Deleted

## 2023-11-18 ENCOUNTER — Other Ambulatory Visit: Payer: Self-pay | Admitting: *Deleted

## 2023-11-18 DIAGNOSIS — E1142 Type 2 diabetes mellitus with diabetic polyneuropathy: Secondary | ICD-10-CM

## 2023-11-18 DIAGNOSIS — I10 Essential (primary) hypertension: Secondary | ICD-10-CM

## 2023-11-18 DIAGNOSIS — E1159 Type 2 diabetes mellitus with other circulatory complications: Secondary | ICD-10-CM

## 2023-11-18 DIAGNOSIS — I152 Hypertension secondary to endocrine disorders: Secondary | ICD-10-CM

## 2023-12-04 DIAGNOSIS — K5903 Drug induced constipation: Secondary | ICD-10-CM | POA: Diagnosis not present

## 2023-12-04 DIAGNOSIS — M545 Low back pain, unspecified: Secondary | ICD-10-CM | POA: Diagnosis not present

## 2023-12-15 ENCOUNTER — Ambulatory Visit: Payer: Self-pay | Admitting: Family Medicine

## 2023-12-18 ENCOUNTER — Ambulatory Visit: Admitting: Family Medicine

## 2023-12-18 ENCOUNTER — Encounter: Payer: Self-pay | Admitting: Family Medicine

## 2023-12-18 VITALS — BP 155/71 | HR 60 | Ht 71.0 in | Wt 238.0 lb

## 2023-12-18 DIAGNOSIS — E1142 Type 2 diabetes mellitus with diabetic polyneuropathy: Secondary | ICD-10-CM

## 2023-12-18 DIAGNOSIS — Z85828 Personal history of other malignant neoplasm of skin: Secondary | ICD-10-CM | POA: Diagnosis not present

## 2023-12-18 DIAGNOSIS — I152 Hypertension secondary to endocrine disorders: Secondary | ICD-10-CM

## 2023-12-18 DIAGNOSIS — I10 Essential (primary) hypertension: Secondary | ICD-10-CM

## 2023-12-18 DIAGNOSIS — E039 Hypothyroidism, unspecified: Secondary | ICD-10-CM | POA: Diagnosis not present

## 2023-12-18 DIAGNOSIS — Z7984 Long term (current) use of oral hypoglycemic drugs: Secondary | ICD-10-CM | POA: Diagnosis not present

## 2023-12-18 DIAGNOSIS — E1159 Type 2 diabetes mellitus with other circulatory complications: Secondary | ICD-10-CM | POA: Diagnosis not present

## 2023-12-18 DIAGNOSIS — L720 Epidermal cyst: Secondary | ICD-10-CM | POA: Diagnosis not present

## 2023-12-18 LAB — BAYER DCA HB A1C WAIVED: HB A1C (BAYER DCA - WAIVED): 6.9 % — ABNORMAL HIGH (ref 4.8–5.6)

## 2023-12-18 MED ORDER — ATENOLOL 100 MG PO TABS: 100.0000 mg | ORAL_TABLET | Freq: Every day | ORAL | 3 refills | Status: AC

## 2023-12-18 MED ORDER — AMLODIPINE BESYLATE 10 MG PO TABS: 10.0000 mg | ORAL_TABLET | Freq: Every day | ORAL | 3 refills | Status: AC

## 2023-12-18 MED ORDER — METFORMIN HCL 500 MG PO TABS: 1000.0000 mg | ORAL_TABLET | Freq: Two times a day (BID) | ORAL | 3 refills | Status: AC

## 2023-12-18 NOTE — Progress Notes (Signed)
 BP (!) 155/71   Pulse 60   Ht 5' 11 (1.803 m)   Wt 238 lb (108 kg)   SpO2 97%   BMI 33.19 kg/m    Subjective:   Patient ID: Stephen Lawson, male    DOB: October 01, 1950, 73 y.o.   MRN: [id number]  HPI: Stephen Lawson is a 73 y.o. male presenting on 12/18/2023 for Medical Management of Chronic Issues, Diabetes, and Hypertension   Discussed the use of AI scribe software for clinical note transcription with the patient, who gave verbal consent to proceed.  History of Present Illness   Stephen Lawson is a 73 year old male with hypertension and diabetes who presents for a recheck of his conditions.  Hypertension - Blood pressure elevated during today's visit - Attributes elevated blood pressure to stress from having two doctor's visits in one day - Does not monitor blood pressure at home due to lack of a machine - Continues daily amlodipine , atenolol , and lisinopril  for blood pressure management  Diabetes mellitus - A1c improved to 6.9 - Continues metformin  and glimepiride  for glycemic control - Remains physically active, especially with woodwork  Infected neck lesion - Visited another physician earlier today for a lesion on the neck - Lesion noted to have infection and swelling - Prescribed antibiotics for the infection  Left hip pain - History of hip surgery - Intermittent left hip pain, previously identified as the worse side - Sudden pain occurs while walking - Pain is becoming bothersome      Relevant past medical, surgical, family and social history reviewed and updated as indicated. Interim medical history since our last visit reviewed. Allergies and medications reviewed and updated.  Review of Systems  Constitutional:  Negative for chills and fever.  Eyes:  Negative for visual disturbance.  Respiratory:  Negative for shortness of breath and wheezing.   Cardiovascular:  Negative for chest pain and leg swelling.  Musculoskeletal:  Negative for back pain and gait problem.   Skin:  Negative for rash.  All other systems reviewed and are negative.   Per HPI unless specifically indicated above   Allergies as of 12/18/2023       Reactions   Darvocet [propoxyphene N-acetaminophen ] Itching      Talwin [pentazocine] Itching   Ultram [tramadol]         Medication List        Accurate as of December 18, 2023  2:39 PM. If you have any questions, ask your nurse or doctor.          amLODipine  10 MG tablet Commonly known as: NORVASC  Take 1 tablet (10 mg total) by mouth daily.   atenolol  100 MG tablet Commonly known as: TENORMIN  Take 1 tablet (100 mg total) by mouth daily.   blood glucose meter kit and supplies Kit Dispense based on patient and insurance preference. Use up to four times daily as directed. (FOR ICD-9 250.00, 250.01).   glimepiride  4 MG tablet Commonly known as: AMARYL  Take 1 tablet (4 mg total) by mouth daily before breakfast.   hydrocortisone 2.5 % cream Apply 1 Application topically 2 (two) times daily as needed (dermatitis).   levothyroxine  112 MCG tablet Commonly known as: SYNTHROID  TAKE 1 TABLET EVERY DAY   lisinopril  20 MG tablet Commonly known as: ZESTRIL  Take 1 tablet (20 mg total) by mouth daily.   metFORMIN  500 MG tablet Commonly known as: GLUCOPHAGE  Take 2 tablets (1,000 mg total) by mouth 2 (two) times daily with a  meal. What changed: See the new instructions. Changed by: Fonda Levins, MD   methadone  10 MG tablet Commonly known as: DOLOPHINE  Take 10 mg by mouth 2 (two) times daily.   tiZANidine  2 MG tablet Commonly known as: ZANAFLEX  Take 1 tablet (2 mg total) by mouth every 8 (eight) hours as needed for muscle spasms.         Objective:   BP (!) 155/71   Pulse 60   Ht 5' 11 (1.803 m)   Wt 238 lb (108 kg)   SpO2 97%   BMI 33.19 kg/m   Wt Readings from Last 3 Encounters:  12/18/23 238 lb (108 kg)  09/14/23 235 lb (106.6 kg)  04/26/23 240 lb (108.9 kg)    Physical Exam Physical  Exam   CHEST: Lungs clear to auscultation bilaterally. CARDIOVASCULAR: Heart regular rate and rhythm. EXTREMITIES: Good pulses, no swelling.       Results for orders placed or performed in visit on 09/15/23  HM DIABETES EYE EXAM   Collection Time: 06/29/23 10:45 AM  Result Value Ref Range   HM Diabetic Eye Exam No Retinopathy No Retinopathy    Assessment & Plan:   Problem List Items Addressed This Visit       Cardiovascular and Mediastinum   Hypertension associated with diabetes (HCC)   Relevant Medications   amLODipine  (NORVASC ) 10 MG tablet   atenolol  (TENORMIN ) 100 MG tablet   metFORMIN  (GLUCOPHAGE ) 500 MG tablet     Endocrine   T2DM (type 2 diabetes mellitus) (HCC) - Primary   Relevant Medications   metFORMIN  (GLUCOPHAGE ) 500 MG tablet   Other Relevant Orders   Bayer DCA Hb A1c Waived   Hypothyroid   Relevant Medications   atenolol  (TENORMIN ) 100 MG tablet   Other Visit Diagnoses       Essential hypertension       Relevant Medications   amLODipine  (NORVASC ) 10 MG tablet   atenolol  (TENORMIN ) 100 MG tablet          Essential hypertension Blood pressure elevated. On amlodipine , atenolol , lisinopril . No home monitoring. - Encouraged home blood pressure monitoring or pharmacy checks. - Continue amlodipine , atenolol , lisinopril . - BP elevated but has been normal in the past, will monitor closely.  Type 2 diabetes mellitus A1c improved to 6.9. On metformin  and glimepiride . Active lifestyle beneficial. - Continue metformin  and glimepiride . - Encouraged physical activity and dietary monitoring.          Follow up plan: Return in about 3 months (around 03/17/2024), or if symptoms worsen or fail to improve, for Diabetes recheck.  Counseling provided for all of the vaccine components Orders Placed This Encounter  Procedures   Bayer DCA Hb A1c Waived    Fonda Levins, MD [facility] 12/18/2023, 2:39 PM

## 2024-02-07 ENCOUNTER — Other Ambulatory Visit: Payer: Self-pay | Admitting: Family Medicine

## 2024-03-18 ENCOUNTER — Ambulatory Visit: Admitting: Family Medicine
